# Patient Record
Sex: Female | Born: 1970 | Race: White | Hispanic: No | Marital: Married | Smoking: Never smoker
Health system: Southern US, Community
[De-identification: ages and names within clinical notes are randomized; demographics above are authoritative.]

## PROBLEM LIST (undated history)

## (undated) DIAGNOSIS — N62 Hypertrophy of breast: Secondary | ICD-10-CM

## (undated) DIAGNOSIS — F489 Nonpsychotic mental disorder, unspecified: Secondary | ICD-10-CM

## (undated) DIAGNOSIS — G479 Sleep disorder, unspecified: Secondary | ICD-10-CM

## (undated) DIAGNOSIS — Z87442 Personal history of urinary calculi: Secondary | ICD-10-CM

## (undated) DIAGNOSIS — F329 Major depressive disorder, single episode, unspecified: Secondary | ICD-10-CM

## (undated) DIAGNOSIS — IMO0002 Reserved for concepts with insufficient information to code with codable children: Secondary | ICD-10-CM

## (undated) DIAGNOSIS — F4310 Post-traumatic stress disorder, unspecified: Secondary | ICD-10-CM

## (undated) DIAGNOSIS — F32A Depression, unspecified: Secondary | ICD-10-CM

## (undated) DIAGNOSIS — M129 Arthropathy, unspecified: Secondary | ICD-10-CM

## (undated) DIAGNOSIS — F419 Anxiety disorder, unspecified: Secondary | ICD-10-CM

## (undated) DIAGNOSIS — R5382 Chronic fatigue, unspecified: Secondary | ICD-10-CM

## (undated) DIAGNOSIS — G9332 Myalgic encephalomyelitis/chronic fatigue syndrome: Secondary | ICD-10-CM

## (undated) DIAGNOSIS — M797 Fibromyalgia: Secondary | ICD-10-CM

## (undated) DIAGNOSIS — M539 Dorsopathy, unspecified: Secondary | ICD-10-CM

## (undated) DIAGNOSIS — G43909 Migraine, unspecified, not intractable, without status migrainosus: Secondary | ICD-10-CM

## (undated) DIAGNOSIS — F988 Other specified behavioral and emotional disorders with onset usually occurring in childhood and adolescence: Secondary | ICD-10-CM

## (undated) DIAGNOSIS — F410 Panic disorder [episodic paroxysmal anxiety] without agoraphobia: Secondary | ICD-10-CM

## (undated) HISTORY — PX: MANDIBLE SURGERY: SHX707

## (undated) HISTORY — DX: Chronic fatigue, unspecified: R53.82

## (undated) HISTORY — DX: Sleep disorder, unspecified: G47.9

## (undated) HISTORY — PX: CHOLECYSTECTOMY: SHX55

## (undated) HISTORY — DX: Nonpsychotic mental disorder, unspecified: F48.9

## (undated) HISTORY — DX: Myalgic encephalomyelitis/chronic fatigue syndrome: G93.32

## (undated) HISTORY — PX: DIAGNOSTIC LAPAROSCOPY: SUR761

## (undated) HISTORY — DX: Arthropathy, unspecified: M12.9

## (undated) HISTORY — PX: ABDOMINAL HYSTERECTOMY: SHX81

---

## 1999-05-24 ENCOUNTER — Other Ambulatory Visit: Admission: RE | Admit: 1999-05-24 | Discharge: 1999-05-24 | Payer: Self-pay | Admitting: Gynecology

## 2000-09-15 ENCOUNTER — Other Ambulatory Visit: Admission: RE | Admit: 2000-09-15 | Discharge: 2000-09-15 | Payer: Self-pay | Admitting: Gynecology

## 2001-03-26 ENCOUNTER — Emergency Department (HOSPITAL_COMMUNITY): Admission: EM | Admit: 2001-03-26 | Discharge: 2001-03-26 | Payer: Self-pay | Admitting: Emergency Medicine

## 2001-03-26 ENCOUNTER — Encounter: Payer: Self-pay | Admitting: Emergency Medicine

## 2001-09-17 ENCOUNTER — Other Ambulatory Visit: Admission: RE | Admit: 2001-09-17 | Discharge: 2001-09-17 | Payer: Self-pay | Admitting: Gynecology

## 2003-11-20 ENCOUNTER — Other Ambulatory Visit: Admission: RE | Admit: 2003-11-20 | Discharge: 2003-11-20 | Payer: Self-pay | Admitting: Gynecology

## 2004-03-26 ENCOUNTER — Other Ambulatory Visit: Admission: RE | Admit: 2004-03-26 | Discharge: 2004-03-26 | Payer: Self-pay | Admitting: Gynecology

## 2004-09-03 ENCOUNTER — Inpatient Hospital Stay (HOSPITAL_COMMUNITY): Admission: AD | Admit: 2004-09-03 | Discharge: 2004-09-08 | Payer: Self-pay | Admitting: Gynecology

## 2004-09-04 ENCOUNTER — Encounter (INDEPENDENT_AMBULATORY_CARE_PROVIDER_SITE_OTHER): Payer: Self-pay | Admitting: Specialist

## 2004-10-18 ENCOUNTER — Other Ambulatory Visit: Admission: RE | Admit: 2004-10-18 | Discharge: 2004-10-18 | Payer: Self-pay | Admitting: Gynecology

## 2006-06-13 ENCOUNTER — Other Ambulatory Visit: Admission: RE | Admit: 2006-06-13 | Discharge: 2006-06-13 | Payer: Self-pay | Admitting: Gynecology

## 2007-11-14 ENCOUNTER — Other Ambulatory Visit: Admission: RE | Admit: 2007-11-14 | Discharge: 2007-11-14 | Payer: Self-pay | Admitting: Gynecology

## 2008-10-24 ENCOUNTER — Ambulatory Visit: Payer: Self-pay | Admitting: Women's Health

## 2008-11-05 ENCOUNTER — Ambulatory Visit: Payer: Self-pay | Admitting: Women's Health

## 2008-11-10 ENCOUNTER — Ambulatory Visit: Payer: Self-pay | Admitting: Women's Health

## 2008-12-23 ENCOUNTER — Inpatient Hospital Stay (HOSPITAL_COMMUNITY): Admission: AD | Admit: 2008-12-23 | Discharge: 2008-12-23 | Payer: Self-pay | Admitting: Obstetrics

## 2009-06-12 ENCOUNTER — Inpatient Hospital Stay (HOSPITAL_COMMUNITY): Admission: RE | Admit: 2009-06-12 | Discharge: 2009-06-14 | Payer: Self-pay | Admitting: Obstetrics and Gynecology

## 2009-06-12 ENCOUNTER — Encounter (INDEPENDENT_AMBULATORY_CARE_PROVIDER_SITE_OTHER): Payer: Self-pay | Admitting: Obstetrics and Gynecology

## 2009-06-16 ENCOUNTER — Observation Stay (HOSPITAL_COMMUNITY): Admission: AD | Admit: 2009-06-16 | Discharge: 2009-06-17 | Payer: Self-pay | Admitting: Obstetrics & Gynecology

## 2009-10-08 ENCOUNTER — Encounter: Admission: RE | Admit: 2009-10-08 | Discharge: 2009-10-08 | Payer: Self-pay | Admitting: Gastroenterology

## 2010-08-02 ENCOUNTER — Encounter: Admission: RE | Admit: 2010-08-02 | Discharge: 2010-08-02 | Payer: Self-pay | Admitting: Obstetrics and Gynecology

## 2010-08-09 ENCOUNTER — Encounter: Admission: RE | Admit: 2010-08-09 | Discharge: 2010-08-09 | Payer: Self-pay | Admitting: Obstetrics and Gynecology

## 2010-08-30 ENCOUNTER — Ambulatory Visit (HOSPITAL_COMMUNITY): Admission: RE | Admit: 2010-08-30 | Discharge: 2010-08-30 | Payer: Self-pay | Admitting: Obstetrics and Gynecology

## 2010-12-28 LAB — CBC
HCT: 42 % (ref 36.0–46.0)
Hemoglobin: 14.3 g/dL (ref 12.0–15.0)
MCH: 29.4 pg (ref 26.0–34.0)
MCHC: 34 g/dL (ref 30.0–36.0)
RDW: 13.2 % (ref 11.5–15.5)

## 2011-01-10 ENCOUNTER — Other Ambulatory Visit: Payer: Self-pay | Admitting: Obstetrics and Gynecology

## 2011-01-10 DIAGNOSIS — Z09 Encounter for follow-up examination after completed treatment for conditions other than malignant neoplasm: Secondary | ICD-10-CM

## 2011-01-21 LAB — COMPREHENSIVE METABOLIC PANEL
AST: 34 U/L (ref 0–37)
Albumin: 2.2 g/dL — ABNORMAL LOW (ref 3.5–5.2)
BUN: 17 mg/dL (ref 6–23)
Calcium: 8.6 mg/dL (ref 8.4–10.5)
Creatinine, Ser: 0.78 mg/dL (ref 0.4–1.2)
GFR calc Af Amer: >60 mL/min (ref >60)
Total Bilirubin: 0.4 mg/dL (ref 0.3–1.2)
Total Protein: 4.8 g/dL — ABNORMAL LOW (ref 6.0–8.3)

## 2011-01-21 LAB — DIFFERENTIAL
Basophils Absolute: 0 10*3/uL (ref 0.0–0.1)
Lymphocytes Relative: 21 % (ref 12–46)
Lymphs Abs: 1.5 10*3/uL (ref 0.7–4.0)
Monocytes Absolute: 0.4 10*3/uL (ref 0.1–1.0)
Monocytes Relative: 6 % (ref 3–12)
Neutro Abs: 4.7 10*3/uL (ref 1.7–7.7)

## 2011-01-21 LAB — CBC
HCT: 30.3 % — ABNORMAL LOW (ref 36.0–46.0)
MCV: 91.8 fL (ref 78.0–100.0)
Platelets: 173 10*3/uL (ref 150–400)
RDW: 14.2 % (ref 11.5–15.5)

## 2011-01-22 LAB — COMPREHENSIVE METABOLIC PANEL
ALT: 17 U/L (ref 0–35)
ALT: 29 U/L (ref 0–35)
AST: 32 U/L (ref 0–37)
AST: 34 U/L (ref 0–37)
CO2: 20 mEq/L (ref 19–32)
CO2: 29 mEq/L (ref 19–32)
Chloride: 104 mEq/L (ref 96–112)
Chloride: 106 mEq/L (ref 96–112)
Creatinine, Ser: 0.7 mg/dL (ref 0.4–1.2)
GFR calc Af Amer: >60 mL/min (ref >60)
GFR calc Af Amer: >60 mL/min (ref >60)
GFR calc non Af Amer: >60 mL/min (ref >60)
GFR calc non Af Amer: >60 mL/min (ref >60)
Glucose, Bld: 80 mg/dL (ref 70–99)
Sodium: 138 mEq/L (ref 135–145)
Total Bilirubin: 0.5 mg/dL (ref 0.3–1.2)
Total Bilirubin: 0.8 mg/dL (ref 0.3–1.2)

## 2011-01-22 LAB — CBC
Hemoglobin: 12.7 g/dL (ref 12.0–15.0)
MCHC: 34.3 g/dL (ref 30.0–36.0)
MCV: 91.2 fL (ref 78.0–100.0)
RBC: 3.38 MIL/uL — ABNORMAL LOW (ref 3.87–5.11)
RBC: 3.41 MIL/uL — ABNORMAL LOW (ref 3.87–5.11)
RBC: 4.14 MIL/uL (ref 3.87–5.11)
RDW: 13.7 % (ref 11.5–15.5)
WBC: 7.2 10*3/uL (ref 4.0–10.5)
WBC: 7.9 10*3/uL (ref 4.0–10.5)

## 2011-01-27 LAB — DIFFERENTIAL
Eosinophils Absolute: 0 10*3/uL (ref 0.0–0.7)
Lymphs Abs: 0.5 10*3/uL — ABNORMAL LOW (ref 0.7–4.0)
Monocytes Relative: 3 % (ref 3–12)
Neutrophils Relative %: 91 % — ABNORMAL HIGH (ref 43–77)

## 2011-01-27 LAB — COMPREHENSIVE METABOLIC PANEL
ALT: 28 U/L (ref 0–35)
Calcium: 9.7 mg/dL (ref 8.4–10.5)
GFR calc Af Amer: >60 mL/min (ref >60)
Glucose, Bld: 109 mg/dL — ABNORMAL HIGH (ref 70–99)
Sodium: 137 mEq/L (ref 135–145)
Total Protein: 6.3 g/dL (ref 6.0–8.3)

## 2011-01-27 LAB — CBC
MCHC: 34.6 g/dL (ref 30.0–36.0)
RDW: 13.5 % (ref 11.5–15.5)

## 2011-01-31 ENCOUNTER — Ambulatory Visit
Admission: RE | Admit: 2011-01-31 | Discharge: 2011-01-31 | Disposition: A | Discharge status: Home / Self Care | Payer: 59 | Source: Ambulatory Visit | Attending: Obstetrics and Gynecology | Admitting: Obstetrics and Gynecology

## 2011-01-31 DIAGNOSIS — Z09 Encounter for follow-up examination after completed treatment for conditions other than malignant neoplasm: Secondary | ICD-10-CM

## 2011-03-01 NOTE — Op Note (Signed)
[name], [name]                ACCOUNT NO.:  [id number]   MEDICAL RECORD NO.:  [id number]          PATIENT TYPE:  INP   LOCATION:  9104                          FACILITY:  [facility]   PHYSICIAN:  Lenoard Aden, M.D.DATE OF BIRTH:  [birth year]   DATE OF PROCEDURE:  06/12/2009  DATE OF DISCHARGE:                               OPERATIVE REPORT   PREOPERATIVE DIAGNOSES:  1. Thirty-seven weeks' intrauterine pregnancy.  2. Previous cesarean section.  3. Desire for elective sterilization.  4. Cholestasis of Pregnancy   POSTOPERATIVE DIAGNOSES:  1.Thirty-seven weeks' intrauterine pregnancy.  1. Previous cesarean section.  2. Desire for elective sterilization.  3. _Cholestasis_________.  4. Lower uterine segment _dehiscence_________.   PROCEDURES:  Repeat low-transverse cesarean section and tubal ligation.   SURGEON:  Lenoard Aden, MD   ASSISTANT:  Marlinda Mike, CNM   ANESTHESIA:  Spinal by Rodman Pickle.   ESTIMATED BLOOD LOSS:  1000 mL.   COMPLICATIONS:  None.   DRAINS:  Foley.   COUNTS:  Correct.   The patient to recovery in good condition.   SPECIMENS:  Placenta __________ to pathology.   BRIEF OPERATIVE NOTE:  After being apprised of risks and benefits of  surgery including anesthesia, infection, bleeding, injury to abdominal  organs and need for repair, delayed versus immediate complications to  include bowel and bladder injury, __________ where she was also apprised  of the failure risk of tubal ligation of 5 to 10 per 1000.  At this  time, she was administered spinal anesthetic without complications,  prepped and draped in usual sterile fashion.  Foley catheter was placed.  After achieving adequate anesthesia, dilute Marcaine solution was placed  and skin incision was made with scalpel and carried down to fascia,  which was nicked in the midline and extended transversely using Mayo  scissors.  Rectus muscles were dissected sharply in the midline.  Peritoneum was  entered sharply.  Bladder blade was placed.  Visceral  peritoneum was scored sharply off of a very thin lower uterine segment,  which is clear and translucent, no evidence of dehiscence, but lower  uterine segment was identified.  A Kerr hysterotomy incision was made.  Atraumatic delivery of a full term living female __________ handed to  pediatricians.  Apgars of 8 and 9.  Cord blood was collected.  Placenta  delivered from a posterior location, intact, 3-vessel cord.  Uterus was  exteriorized, curetted using a dry lap pack and closed in a running  locking __________.  Right tube traced out to the fimbriated end and  ampullary-isthmic portion was identified.  Pomeroy tubal ligation  accomplished with __________, the tubal lumen was visualized and  cauterized.  Good hemostasis was noted.  The same procedure was done on  the right tube and left tube.  __________ the abdominal cavity.  Irrigation was accomplished __________ hemostatic.  The fascia was then  closed using __________ fashion.  The patient tolerated the procedure  well and transferred to recovery in good condition.      Lenoard Aden, M.D.  Electronically Signed     RJT/MEDQ  D:  06/12/2009  T:  06/12/2009  Job:  [id number]

## 2011-03-04 NOTE — Op Note (Signed)
[name], [name]                ACCOUNT NO.:  [id number]   MEDICAL RECORD NO.:  [id number]          PATIENT TYPE:  INP   LOCATION:  9372                          [facility]ILITY[facility]  [facility]   PHYSICIAN:  Charles A. Delcambre, MDDATE OF BIRTH:  Mar 04, 1971   DATE OF PROCEDURE:  09/04/2004  DATE OF DISCHARGE:                                 OPERATIVE REPORT   PREOPERATIVE DIAGNOSES:  1.  Severe pregnancy induced hypertension/preeclampsia versus HELLP      syndrome.  2.  Intrauterine pregnancy at 34-4/7 weeks.   POSTOPERATIVE DIAGNOSES:  1.  Severe pregnancy induced hypertension/preeclampsia versus HELLP      syndrome.  2.  Intrauterine pregnancy at 34-4/7 weeks.   PROCEDURE:  Primary low transverse cesarean section.   SURGEON:  Charles A. Sydnee Cabal, M.D.   ASSISTANT:  None.   COMPLICATIONS:  None.   ESTIMATED BLOOD LOSS:  900 mL.   COUNTS:  Instrument, sponge and needle count correct x2.   SPECIMENS:  Placenta to pathology.   FINDINGS:  Vigorous female, Apgars 6 and 8.  Thin meconium.  Nuchal cord x1.  Cord arterial blood gas 7.23.  Cord for venous blood gas 7.29.   URINE OUTPUT:  300 mL.   INTRAVENOUS FLUIDS:  2000 mL.   DESCRIPTION OF PROCEDURE:  The patient was taken to the operating room and  placed in the supine position.  Spinal anesthetic had been placed and was  adequate.  Sterile prep and drape was then taken.  A Pfannenstiel incision  was made with the knife and carried down to the fascia.  The fascia was  incised with a knife and Mayo scissors.  The rectus sheath was released  superiorly and inferiorly without difficulty.  Blunt dissection as well as  sharp dissection was used to separate the rectus muscles in the midline.  The peritoneum was entered with Metzenbaum scissors.  Traction was used to  extend the incision.  The bladder blade was placed.  The vesicouterine  peritoneum was incised with Metzenbaum scissors, and blunt dissection was  used to close the  bladder flap.  The lower uterine segment transverse  incision was made with the knife to the amniotomy.  Traction was used to  extend the incision.  The hand was inserted and occiput lifted to the  incision site.  The fundal pressure was applied by the operator's assistant  and influenced by the liver without difficulty.  When the hip was delivered,  DeLee suction was carried out.  Nuchal cord was reduced.  The remainder of  the infant was delivered without difficulty.  The cord was clamped and  handed off to the neonatologist Dr. Judie Grieve.  Nuchal cord was reduced.  The  remainder of the infant was delivered without difficulty.  The cord was  clamped and handed off to the neonatologist, Dr. Judie Grieve, who is in  attendance.  After being shown to the parents, cord gases were taken.  Cord  blood was collected.  The uterus was externalized after manual extraction of  the placenta.  The internal surface of the uterus was wiped with moistened  lap.  A #1 chromic was then used to close the uterus in two layers, the  first layer a running locking, the second layer a running nonlocking  imbricating over the first.  A single figure-of-eight suture of 0 Vicryl and  then a second figure-of-eight overlying this first of 2-0 Vicryl were placed  for good hemostasis.  Hemostasis was excellent.  The uterus had been  reinternalized.  Irrigation was carried out.  The pericolic gutter and  uterine incision were visualized.  Hemostasis was adequate.  Bladder flap  was said to have good hemostasis.  Subfascial hemostasis was excellent.  The  fascia was then closed with #1 Vicryl running nonlocking sutures.  Subcutaneous irrigation was excellent.  Irrigation was carried out.  Sterile  skin clips were used to close the incision.  A sterile dressing was applied  and Steri-Strips and the patient was recovered and taken to recovery.      CAD/MEDQ  D:  09/04/2004  T:  09/05/2004  Job:  [id number]

## 2011-03-04 NOTE — Discharge Summary (Signed)
NAMEMARGRETTA, [name]                ACCOUNT NO.:  [id number]   MEDICAL RECORD NO.:  [id number]          PATIENT TYPE:  INP   LOCATION:  9121                          [facility]ILITY:  [facility]   PHYSICIAN:  Timothy P. Fontaine, M.D.DATE OF BIRTH:  04/11/1971   DATE OF ADMISSION:  09/03/2004  DATE OF DISCHARGE:  09/08/2004                                 DISCHARGE SUMMARY   DISCHARGE DIAGNOSES:  1.  Pregnancy at 34 weeks.  2.  Hemolysis, elevated liver function, and low platelet syndrome.   PROCEDURE:  Primary low transverse cervical cesarean section on September 04, 2004 by Dr. Sydnee Cabal.   HOSPITAL COURSE:  A 41 year old G1 P0 female at 87 weeks who presented  complaining of itching.  She was found to have elevated liver function  studies to include increased SGOT and SGPT with an elevated blood pressure  in the 120 to 122 over 90 to 94 range.  She had 2+ proteinuria and was  admitted with the diagnosis of HELLP syndrome.  The patient received  magnesium sulfate prophylaxis, was begun on steroid administration to  accelerate fetal lung maturity.  The patient subsequently underwent a  primary cesarean section for HELLP syndrome after the administration of the  steroids, and subsequently produced a normal female, Apgars 6 and 8, nuchal  cord noted.  Arterial cord pH was 7.23.  The patient's postoperative course  was unremarkable, blood pressures running in the 120s over 70s range, and  her liver function tests decreasing.  Her platelet count had always been in  the normal range, noting a postoperative hemoglobin of 10 and a platelet  count of 229,000.  The patient was discharged on postoperative day #4  ambulating well, tolerating a regular diet.  She received precautions,  instructions, and follow-up, will be seen in 6 weeks.  Received a  prescription for Tylox #20 one to two p.o. q.4-6h. p.r.n. pain.  The patient  did receive an influenza viral vaccine before her discharge.     Timo   TPF/MEDQ  D:  09/30/2004  T:  09/30/2004  Job:  [id number]

## 2011-03-04 NOTE — H&P (Signed)
[name], [name]                ACCOUNT NO.:  [id number]   MEDICAL RECORD NO.:  [id number]          PATIENT TYPE:  INP   LOCATION:  9169                          [facility]ILITY[facility]  [facility]   PHYSICIAN:  Timothy P. Fontaine, M.D.DATE OF BIRTH:  08/16/1971   DATE OF ADMISSION:  09/03/2004  DATE OF DISCHARGE:                                HISTORY & PHYSICAL   CHIEF COMPLAINT:  Pregnancy at 34 weeks, itching, swelling.   HISTORY OF PRESENT ILLNESS:  A 40 year old G1 P0 female at [redacted] weeks  gestation who presented complaining of itching.  The patient otherwise was  doing well, had a liver panel drawn which showed elevated alkaline  phosphatase, SGOT, SGPT, as well as elevated bile acids in the 153 range.  The patient returned for evaluation, was found to have an elevated blood  pressure in the 120 to 122 over 90 to 94 range, 2+ proteinuria,  hyperreflexia.  She has a normal NST, normal AFI, and a biophysical profile  of 8/8.  The patient denies symptoms such as headache, blurred vision, or  epigastric pain.  She does note swelling which seems to have significantly  worsened over the last several days.  Prenatal course has otherwise been  uncomplicated.  She does have a history of depression for which she  currently is on Zoloft.  For the remainder of her history and physical, see  her [facility].   PHYSICAL EXAMINATION:  VITAL SIGNS:  Blood pressure 122/94.  HEENT:  Normal.  LUNGS:  Clear.  CARDIAC:  Regular rate and rhythm without rubs, murmurs, or gallops.  ABDOMEN:  Gravid, vertex fetus appropriate for dates.  Reactive NST without  significant contractions.  PELVIC:  Shows 50%, closed, -2, vertex presentation.  EXTREMITIES:  With 1+ pitting edema, 3-4+ DTRs, no clonus.   ASSESSMENT AND PLAN:  A 40 year old gravida 1 para 0 at 34 weeks,  significant elevation in her liver functions, hypertension, proteinuria.  Differential most likely HELLP syndrome.  CBC is pending at the time of this  dictation, which would help to clarify the situation.  Differential does  include cholestasis of pregnancy; viral hepatitis, although the patient has  no history of exposure, drug or medication, although again the patient has  no history of significant drug exposure; acute fatty liver of pregnancy; and  other primary hepatic diseases.  Given 34 weeks and the most likely being  HELLP, discussed situation with the patient and her husband and I think the  most prudent is to proceed with delivery.  Will go ahead and recheck liver  function studies now, along with CBC, hepatitis viral panel both acute and  chronic, comprehensive metabolic panel for renal function, glucose,  electrolytes.  Begin on magnesium sulfate prophylaxis 4 g bolus, 2 g per  hour, steroid administration for fetal lung acceleration, and then move  towards induction.  If clinically significantly worsens or questionable  fetal well-being, or platelet count significantly low, then may proceed more  expeditiously with cesarean delivery.  The plan was discussed with the  patient and her husband, who understand and agree.  Then will  after delivery  follow liver functions if question of primary disease and will involve  gastroenterologist for further evaluation and therapy.     Timo   TPF/MEDQ  D:  09/03/2004  T:  09/03/2004  Job:  [id number]

## 2011-03-30 ENCOUNTER — Ambulatory Visit (HOSPITAL_COMMUNITY)
Admission: RE | Admit: 2011-03-30 | Discharge: 2011-03-30 | Disposition: A | Discharge status: Home / Self Care | Payer: 59 | Attending: Psychiatry | Admitting: Psychiatry

## 2011-03-30 DIAGNOSIS — F329 Major depressive disorder, single episode, unspecified: Secondary | ICD-10-CM | POA: Insufficient documentation

## 2011-04-01 ENCOUNTER — Other Ambulatory Visit (HOSPITAL_COMMUNITY): Payer: 59 | Admitting: Psychiatry

## 2011-04-04 ENCOUNTER — Other Ambulatory Visit (HOSPITAL_COMMUNITY): Payer: 59 | Attending: Psychiatry | Admitting: Psychiatry

## 2011-04-04 DIAGNOSIS — F3289 Other specified depressive episodes: Secondary | ICD-10-CM

## 2011-04-04 DIAGNOSIS — F329 Major depressive disorder, single episode, unspecified: Secondary | ICD-10-CM | POA: Insufficient documentation

## 2011-04-05 ENCOUNTER — Other Ambulatory Visit (HOSPITAL_COMMUNITY): Payer: 59 | Admitting: Psychiatry

## 2011-04-06 ENCOUNTER — Other Ambulatory Visit (HOSPITAL_COMMUNITY): Payer: 59 | Attending: Psychiatry | Admitting: Psychiatry

## 2011-04-06 DIAGNOSIS — F329 Major depressive disorder, single episode, unspecified: Secondary | ICD-10-CM | POA: Insufficient documentation

## 2011-04-07 ENCOUNTER — Other Ambulatory Visit (HOSPITAL_COMMUNITY): Payer: 59 | Admitting: Psychiatry

## 2011-04-08 ENCOUNTER — Other Ambulatory Visit (HOSPITAL_COMMUNITY): Payer: 59 | Admitting: Psychiatry

## 2011-04-11 ENCOUNTER — Other Ambulatory Visit (HOSPITAL_COMMUNITY): Payer: 59 | Admitting: Psychiatry

## 2011-04-12 ENCOUNTER — Other Ambulatory Visit (HOSPITAL_COMMUNITY): Payer: 59 | Admitting: Psychiatry

## 2011-04-14 ENCOUNTER — Other Ambulatory Visit (HOSPITAL_COMMUNITY): Payer: 59 | Admitting: Psychiatry

## 2011-04-15 ENCOUNTER — Other Ambulatory Visit (HOSPITAL_COMMUNITY): Payer: 59 | Admitting: Psychiatry

## 2011-04-18 ENCOUNTER — Other Ambulatory Visit (HOSPITAL_COMMUNITY): Payer: 59 | Admitting: Psychiatry

## 2011-04-19 ENCOUNTER — Other Ambulatory Visit (HOSPITAL_COMMUNITY): Payer: 59 | Admitting: Psychiatry

## 2011-04-21 ENCOUNTER — Other Ambulatory Visit (HOSPITAL_COMMUNITY): Payer: 59 | Admitting: Psychiatry

## 2011-04-22 ENCOUNTER — Other Ambulatory Visit (HOSPITAL_COMMUNITY): Payer: 59 | Admitting: Psychiatry

## 2011-04-25 ENCOUNTER — Other Ambulatory Visit (HOSPITAL_COMMUNITY): Payer: 59 | Admitting: Psychiatry

## 2011-04-26 ENCOUNTER — Other Ambulatory Visit (HOSPITAL_COMMUNITY): Payer: 59 | Admitting: Psychiatry

## 2011-05-17 ENCOUNTER — Other Ambulatory Visit: Payer: Self-pay | Admitting: Obstetrics and Gynecology

## 2011-05-17 DIAGNOSIS — N63 Unspecified lump in unspecified breast: Secondary | ICD-10-CM

## 2011-07-18 ENCOUNTER — Ambulatory Visit
Admission: RE | Admit: 2011-07-18 | Discharge: 2011-07-18 | Disposition: A | Discharge status: Home / Self Care | Payer: 59 | Source: Ambulatory Visit | Attending: Obstetrics and Gynecology | Admitting: Obstetrics and Gynecology

## 2011-07-18 DIAGNOSIS — N63 Unspecified lump in unspecified breast: Secondary | ICD-10-CM

## 2012-06-13 ENCOUNTER — Other Ambulatory Visit: Payer: Self-pay | Admitting: Obstetrics and Gynecology

## 2012-06-13 DIAGNOSIS — Z1231 Encounter for screening mammogram for malignant neoplasm of breast: Secondary | ICD-10-CM

## 2012-07-23 ENCOUNTER — Ambulatory Visit
Admission: RE | Admit: 2012-07-23 | Discharge: 2012-07-23 | Disposition: A | Discharge status: Home / Self Care | Payer: 59 | Source: Ambulatory Visit | Attending: Obstetrics and Gynecology | Admitting: Obstetrics and Gynecology

## 2012-07-23 DIAGNOSIS — Z1231 Encounter for screening mammogram for malignant neoplasm of breast: Secondary | ICD-10-CM

## 2013-09-20 ENCOUNTER — Other Ambulatory Visit: Payer: Self-pay | Admitting: Obstetrics and Gynecology

## 2013-10-03 ENCOUNTER — Encounter (HOSPITAL_COMMUNITY): Payer: Self-pay

## 2013-10-15 ENCOUNTER — Encounter (HOSPITAL_COMMUNITY)
Admission: RE | Admit: 2013-10-15 | Discharge: 2013-10-15 | Disposition: A | Discharge status: Home / Self Care | Payer: 59 | Source: Ambulatory Visit | Attending: Obstetrics and Gynecology | Admitting: Obstetrics and Gynecology

## 2013-10-15 ENCOUNTER — Encounter (HOSPITAL_COMMUNITY): Payer: Self-pay

## 2013-10-15 HISTORY — DX: Major depressive disorder, single episode, unspecified: F32.9

## 2013-10-15 HISTORY — DX: Personal history of urinary calculi: Z87.442

## 2013-10-15 HISTORY — DX: Anxiety disorder, unspecified: F41.9

## 2013-10-15 HISTORY — DX: Depression, unspecified: F32.A

## 2013-10-15 HISTORY — DX: Reserved for concepts with insufficient information to code with codable children: IMO0002

## 2013-10-15 HISTORY — DX: Other specified behavioral and emotional disorders with onset usually occurring in childhood and adolescence: F98.8

## 2013-10-15 LAB — CBC
HCT: 37.4 % (ref 36.0–46.0)
Hemoglobin: 12.8 g/dL (ref 12.0–15.0)
MCHC: 34.2 g/dL (ref 30.0–36.0)
RBC: 4.24 MIL/uL (ref 3.87–5.11)
RDW: 12.7 % (ref 11.5–15.5)
WBC: 6.4 10*3/uL (ref 4.0–10.5)

## 2013-10-15 NOTE — H&P (Signed)
[name], [name]                ACCOUNT NO.:  [id number]  MEDICAL RECORD NO.:  [id number]  LOCATION:  PERIO                         [facility]ACILITY:  [facility]  PHYSICIAN:  Lenoard Aden, M.D.DATE OF BIRTH:  02-Feb-1971  DATE OF ADMISSION:  09/06/2013 DATE OF DISCHARGE:                             HISTORY & PHYSICAL   CHIEF COMPLAINT:  Refractory menorrhagia.  HISTORY OF PRESENT ILLNESS:  She is a 42 year old white female, G2, P2, history of C-section x2, who presents for definitive management and refractory menorrhagia and failed endometrial ablation.  PAST MEDICAL HISTORY:  ALLERGIES:  SULFA.  FAMILY HISTORY:  Colon cancer, breast cancer, hypertension, depression, insulin-dependent diabetes.  MEDICATIONS: 1. Flexeril. 2. Xanax. 3. Adderall. 4. Prozac.  SURGICAL HISTORY:  Skin graft to the mouth, NovaSure ablation in 2011, C- section x2 with tubal ligation.  PHYSICAL EXAMINATION:  GENERAL:  She is a well-developed, well- nourished, white female, in no acute distress. HEENT:  Normal. NECK:  Supple.  Full range of motion. LUNGS:  Clear. HEART:  Regular rate and rhythm. ABDOMEN:  Soft, nontender. PELVIC:  Reveals the uterus to be bulky and anteflexed with no adnexal masses.  Anterior fibroid is about 3 x 3 cm noted. EXTREMITIES:  There are no cords. NEUROLOGIC:  Nonfocal. SKIN:  Intact.  IMPRESSION: 1. Symptomatic fibroids. 2. Refractory menorrhagia.  PLAN:  Proceed with da Vinci assisted total laparoscopic hysterectomy, bilateral salpingectomy, risks of anesthesia, infection, bleeding, injury to surrounding organs, possible need for repair was discussed, delayed versus immediate complications to include bowel and bladder injury noted.  The patient acknowledges and wishes to proceed.     Lenoard Aden, M.D.     RJT/MEDQ  D:  10/15/2013  T:  10/15/2013  Job:  [id number]

## 2013-10-15 NOTE — Patient Instructions (Signed)
20 [name]  10/15/2013   Your procedure is scheduled on:  10/16/13  Enter through the Main Entrance of [facility] at 6 AM.  Pick up the phone at the desk and dial [phone].   Call this number if you have problems the morning of surgery: [phone]   Remember:   Do not eat food:After Midnight.  Do not drink clear liquids: After Midnight.  Take these medicines the morning of surgery with A SIP OF WATER: may take Xanax if needed.   Do not wear jewelry, make-up or nail polish.  Do not wear lotions, powders, or perfumes. You may wear deodorant.  Do not shave 48 hours prior to surgery.  Do not bring valuables to the hospital.  [facility] is not   responsible for any belongings or valuables brought to the hospital.  Contacts, dentures or bridgework may not be worn into surgery.  Leave suitcase in the car. After surgery it may be brought to your room.  For patients admitted to the hospital, checkout time is 11:00 AM the day of              discharge.   Patients discharged the day of surgery will not be allowed to drive             home.  Name and phone number of your driver: NA  Special Instructions:   Shower using CHG 2 nights before surgery and the night before surgery.  If you shower the day of surgery use CHG.  Use special wash - you have one bottle of CHG for all showers.  You should use approximately 1/3 of the bottle for each shower.   Please read over the following fact sheets that you were given:   Surgical Site Infection Prevention

## 2013-10-16 ENCOUNTER — Encounter (HOSPITAL_COMMUNITY): Payer: Self-pay | Admitting: Anesthesiology

## 2013-10-16 ENCOUNTER — Ambulatory Visit (HOSPITAL_COMMUNITY): Payer: 59 | Admitting: Anesthesiology

## 2013-10-16 ENCOUNTER — Encounter (HOSPITAL_COMMUNITY): Payer: 59 | Admitting: Anesthesiology

## 2013-10-16 ENCOUNTER — Ambulatory Visit (HOSPITAL_COMMUNITY)
Admission: RE | Admit: 2013-10-16 | Discharge: 2013-10-17 | Disposition: A | Discharge status: Home / Self Care | Payer: 59 | Source: Ambulatory Visit | Attending: Obstetrics and Gynecology | Admitting: Obstetrics and Gynecology

## 2013-10-16 ENCOUNTER — Encounter (HOSPITAL_COMMUNITY)
Admission: RE | Disposition: A | Discharge status: Home / Self Care | Payer: Self-pay | Source: Ambulatory Visit | Attending: Obstetrics and Gynecology

## 2013-10-16 DIAGNOSIS — N8000 Endometriosis of the uterus, unspecified: Secondary | ICD-10-CM | POA: Insufficient documentation

## 2013-10-16 DIAGNOSIS — D251 Intramural leiomyoma of uterus: Secondary | ICD-10-CM | POA: Insufficient documentation

## 2013-10-16 DIAGNOSIS — N949 Unspecified condition associated with female genital organs and menstrual cycle: Secondary | ICD-10-CM | POA: Insufficient documentation

## 2013-10-16 DIAGNOSIS — D252 Subserosal leiomyoma of uterus: Secondary | ICD-10-CM | POA: Insufficient documentation

## 2013-10-16 DIAGNOSIS — N920 Excessive and frequent menstruation with regular cycle: Secondary | ICD-10-CM | POA: Insufficient documentation

## 2013-10-16 DIAGNOSIS — N815 Vaginal enterocele: Secondary | ICD-10-CM | POA: Insufficient documentation

## 2013-10-16 DIAGNOSIS — D219 Benign neoplasm of connective and other soft tissue, unspecified: Secondary | ICD-10-CM | POA: Diagnosis present

## 2013-10-16 HISTORY — PX: BILATERAL SALPINGECTOMY: SHX5743

## 2013-10-16 HISTORY — PX: ROBOTIC ASSISTED TOTAL HYSTERECTOMY: SHX6085

## 2013-10-16 LAB — HCG, SERUM, QUALITATIVE: Preg, Serum: NEGATIVE

## 2013-10-16 SURGERY — ROBOTIC ASSISTED TOTAL HYSTERECTOMY
Anesthesia: General | Site: Abdomen

## 2013-10-16 MED ORDER — PROPOFOL 10 MG/ML IV BOLUS
INTRAVENOUS | Status: DC | PRN
  Administered 2013-10-16: 150 mg via INTRAVENOUS

## 2013-10-16 MED ORDER — ZOLPIDEM TARTRATE 5 MG PO TABS
5.0000 mg | ORAL_TABLET | Freq: Every evening | ORAL | Status: DC | PRN
  Administered 2013-10-16: 5 mg via ORAL
  Filled 2013-10-16: qty 1

## 2013-10-16 MED ORDER — FENTANYL CITRATE 0.05 MG/ML IJ SOLN
INTRAMUSCULAR | Status: AC
  Filled 2013-10-16: qty 5

## 2013-10-16 MED ORDER — KETOROLAC TROMETHAMINE 30 MG/ML IJ SOLN
INTRAMUSCULAR | Status: AC
  Filled 2013-10-16: qty 1

## 2013-10-16 MED ORDER — LIDOCAINE HCL (CARDIAC) 20 MG/ML IV SOLN
INTRAVENOUS | Status: AC
  Filled 2013-10-16: qty 5

## 2013-10-16 MED ORDER — ROPIVACAINE HCL 5 MG/ML IJ SOLN
INTRAMUSCULAR | Status: AC
  Filled 2013-10-16: qty 30

## 2013-10-16 MED ORDER — EPHEDRINE SULFATE 50 MG/ML IJ SOLN
INTRAMUSCULAR | Status: DC | PRN
  Administered 2013-10-16: 5 mg via INTRAVENOUS
  Administered 2013-10-16: 10 mg via INTRAVENOUS
  Administered 2013-10-16: 5 mg via INTRAVENOUS

## 2013-10-16 MED ORDER — ONDANSETRON HCL 4 MG/2ML IJ SOLN: 4.0000 mg | Freq: Four times a day (QID) | INTRAMUSCULAR | Status: DC | PRN

## 2013-10-16 MED ORDER — KETOROLAC TROMETHAMINE 30 MG/ML IJ SOLN
INTRAMUSCULAR | Status: DC | PRN
  Administered 2013-10-16: 30 mg via INTRAVENOUS

## 2013-10-16 MED ORDER — LIDOCAINE HCL (CARDIAC) 20 MG/ML IV SOLN
INTRAVENOUS | Status: DC | PRN
  Administered 2013-10-16: 80 mg via INTRAVENOUS

## 2013-10-16 MED ORDER — ROCURONIUM BROMIDE 100 MG/10ML IV SOLN
INTRAVENOUS | Status: DC | PRN
  Administered 2013-10-16: 50 mg via INTRAVENOUS
  Administered 2013-10-16: 30 mg via INTRAVENOUS

## 2013-10-16 MED ORDER — SODIUM CHLORIDE 0.9 % IJ SOLN
INTRAMUSCULAR | Status: AC
  Filled 2013-10-16: qty 20

## 2013-10-16 MED ORDER — MIDAZOLAM HCL 2 MG/2ML IJ SOLN
INTRAMUSCULAR | Status: AC
  Filled 2013-10-16: qty 2

## 2013-10-16 MED ORDER — DEXTROSE IN LACTATED RINGERS 5 % IV SOLN: INTRAVENOUS | Status: DC

## 2013-10-16 MED ORDER — SCOPOLAMINE 1 MG/3DAYS TD PT72
1.0000 | MEDICATED_PATCH | TRANSDERMAL | Status: DC
  Administered 2013-10-16: 1.5 mg via TRANSDERMAL

## 2013-10-16 MED ORDER — BUPIVACAINE HCL (PF) 0.25 % IJ SOLN
INTRAMUSCULAR | Status: DC | PRN
  Administered 2013-10-16: 7 mL

## 2013-10-16 MED ORDER — ROCURONIUM BROMIDE 100 MG/10ML IV SOLN
INTRAVENOUS | Status: AC
  Filled 2013-10-16: qty 1

## 2013-10-16 MED ORDER — MEPERIDINE HCL 25 MG/ML IJ SOLN: 6.2500 mg | INTRAMUSCULAR | Status: DC | PRN

## 2013-10-16 MED ORDER — FENTANYL CITRATE 0.05 MG/ML IJ SOLN
INTRAMUSCULAR | Status: DC | PRN
  Administered 2013-10-16 (×5): 50 ug via INTRAVENOUS

## 2013-10-16 MED ORDER — METHYLENE BLUE 1 % INJ SOLN
INTRAMUSCULAR | Status: AC
  Filled 2013-10-16: qty 10

## 2013-10-16 MED ORDER — BUPIVACAINE HCL (PF) 0.25 % IJ SOLN
INTRAMUSCULAR | Status: AC
  Filled 2013-10-16: qty 30

## 2013-10-16 MED ORDER — DIPHENHYDRAMINE HCL 12.5 MG/5ML PO ELIX: 12.5000 mg | ORAL_SOLUTION | Freq: Four times a day (QID) | ORAL | Status: DC | PRN

## 2013-10-16 MED ORDER — LACTATED RINGERS IV SOLN
INTRAVENOUS | Status: DC
  Administered 2013-10-16 (×2): via INTRAVENOUS

## 2013-10-16 MED ORDER — NEOSTIGMINE METHYLSULFATE 1 MG/ML IJ SOLN
INTRAMUSCULAR | Status: DC | PRN
  Administered 2013-10-16: 3 mg via INTRAVENOUS

## 2013-10-16 MED ORDER — NEOSTIGMINE METHYLSULFATE 1 MG/ML IJ SOLN
INTRAMUSCULAR | Status: AC
  Filled 2013-10-16: qty 1

## 2013-10-16 MED ORDER — ONDANSETRON HCL 4 MG/2ML IJ SOLN
INTRAMUSCULAR | Status: AC
  Filled 2013-10-16: qty 2

## 2013-10-16 MED ORDER — CEFAZOLIN SODIUM-DEXTROSE 2-3 GM-% IV SOLR
2.0000 g | INTRAVENOUS | Status: AC
  Administered 2013-10-16: 2 g via INTRAVENOUS

## 2013-10-16 MED ORDER — TRAZODONE HCL 100 MG PO TABS
100.0000 mg | ORAL_TABLET | Freq: Every day | ORAL | Status: DC
  Administered 2013-10-16: 100 mg via ORAL
  Filled 2013-10-16: qty 1

## 2013-10-16 MED ORDER — ONDANSETRON HCL 4 MG/2ML IJ SOLN
INTRAMUSCULAR | Status: DC | PRN
  Administered 2013-10-16: 4 mg via INTRAVENOUS

## 2013-10-16 MED ORDER — TRAMADOL HCL 50 MG PO TABS: 50.0000 mg | ORAL_TABLET | Freq: Four times a day (QID) | ORAL | Status: DC | PRN

## 2013-10-16 MED ORDER — FLUOXETINE HCL 10 MG PO CAPS
10.0000 mg | ORAL_CAPSULE | Freq: Every day | ORAL | Status: DC
  Filled 2013-10-16 (×2): qty 1

## 2013-10-16 MED ORDER — SODIUM CHLORIDE 0.9 % IJ SOLN: 9.0000 mL | INTRAMUSCULAR | Status: DC | PRN

## 2013-10-16 MED ORDER — FENTANYL CITRATE 0.05 MG/ML IJ SOLN
INTRAMUSCULAR | Status: AC
  Administered 2013-10-16: 50 ug via INTRAVENOUS
  Filled 2013-10-16: qty 2

## 2013-10-16 MED ORDER — DEXAMETHASONE SODIUM PHOSPHATE 10 MG/ML IJ SOLN
INTRAMUSCULAR | Status: AC
  Filled 2013-10-16: qty 1

## 2013-10-16 MED ORDER — GLYCOPYRROLATE 0.2 MG/ML IJ SOLN
INTRAMUSCULAR | Status: AC
  Filled 2013-10-16: qty 3

## 2013-10-16 MED ORDER — NALOXONE HCL 0.4 MG/ML IJ SOLN: 0.4000 mg | INTRAMUSCULAR | Status: DC | PRN

## 2013-10-16 MED ORDER — METOCLOPRAMIDE HCL 5 MG/ML IJ SOLN: 10.0000 mg | Freq: Once | INTRAMUSCULAR | Status: DC | PRN

## 2013-10-16 MED ORDER — DIPHENHYDRAMINE HCL 50 MG/ML IJ SOLN: 12.5000 mg | Freq: Four times a day (QID) | INTRAMUSCULAR | Status: DC | PRN

## 2013-10-16 MED ORDER — MIDAZOLAM HCL 2 MG/2ML IJ SOLN
INTRAMUSCULAR | Status: DC | PRN
  Administered 2013-10-16: 2 mg via INTRAVENOUS

## 2013-10-16 MED ORDER — FENTANYL CITRATE 0.05 MG/ML IJ SOLN
25.0000 ug | INTRAMUSCULAR | Status: DC | PRN
  Administered 2013-10-16 (×3): 50 ug via INTRAVENOUS

## 2013-10-16 MED ORDER — GLYCOPYRROLATE 0.2 MG/ML IJ SOLN
INTRAMUSCULAR | Status: DC | PRN
  Administered 2013-10-16: .6 mg via INTRAVENOUS

## 2013-10-16 MED ORDER — PROPOFOL 10 MG/ML IV EMUL
INTRAVENOUS | Status: AC
  Filled 2013-10-16: qty 20

## 2013-10-16 MED ORDER — FENTANYL CITRATE 0.05 MG/ML IJ SOLN
INTRAMUSCULAR | Status: AC
  Filled 2013-10-16: qty 2

## 2013-10-16 MED ORDER — CEFAZOLIN SODIUM-DEXTROSE 2-3 GM-% IV SOLR
INTRAVENOUS | Status: AC
  Filled 2013-10-16: qty 50

## 2013-10-16 MED ORDER — OXYCODONE-ACETAMINOPHEN 5-325 MG PO TABS
1.0000 | ORAL_TABLET | ORAL | Status: DC | PRN
  Administered 2013-10-16 – 2013-10-17 (×6): 2 via ORAL
  Filled 2013-10-16 (×6): qty 2

## 2013-10-16 MED ORDER — OSELTAMIVIR PHOSPHATE 75 MG PO CAPS
75.0000 mg | ORAL_CAPSULE | Freq: Every day | ORAL | Status: DC
  Filled 2013-10-16 (×2): qty 1

## 2013-10-16 MED ORDER — SODIUM CHLORIDE 0.9 % IJ SOLN
INTRAMUSCULAR | Status: AC
  Filled 2013-10-16: qty 50

## 2013-10-16 MED ORDER — HYDROMORPHONE 0.3 MG/ML IV SOLN
INTRAVENOUS | Status: DC
  Filled 2013-10-16: qty 25

## 2013-10-16 MED ORDER — DEXAMETHASONE SODIUM PHOSPHATE 10 MG/ML IJ SOLN
INTRAMUSCULAR | Status: DC | PRN
  Administered 2013-10-16: 10 mg via INTRAVENOUS

## 2013-10-16 MED ORDER — SCOPOLAMINE 1 MG/3DAYS TD PT72
MEDICATED_PATCH | TRANSDERMAL | Status: AC
  Administered 2013-10-16: 1.5 mg via TRANSDERMAL
  Filled 2013-10-16: qty 1

## 2013-10-16 MED ORDER — ROPIVACAINE HCL 5 MG/ML IJ SOLN
INTRAMUSCULAR | Status: DC | PRN
  Administered 2013-10-16: 10 mL

## 2013-10-16 SURGICAL SUPPLY — 71 items
ADH SKN CLS APL DERMABOND .7 (GAUZE/BANDAGES/DRESSINGS) ×3
BAG URINE DRAINAGE (UROLOGICAL SUPPLIES) ×4 IMPLANT
BARRIER ADHS 3X4 INTERCEED (GAUZE/BANDAGES/DRESSINGS) ×4 IMPLANT
BRR ADH 4X3 ABS CNTRL BYND (GAUZE/BANDAGES/DRESSINGS) ×3
CATH FOLEY 3WAY  5CC 16FR (CATHETERS) ×1
CATH FOLEY 3WAY 5CC 16FR (CATHETERS) ×3 IMPLANT
CHLORAPREP W/TINT 26ML (MISCELLANEOUS) ×4 IMPLANT
CLOTH BEACON ORANGE TIMEOUT ST (SAFETY) ×4 IMPLANT
CONT PATH 16OZ SNAP LID 3702 (MISCELLANEOUS) ×4 IMPLANT
COVER MAYO STAND STRL (DRAPES) ×4 IMPLANT
COVER TABLE BACK 60X90 (DRAPES) ×8 IMPLANT
COVER TIP SHEARS 8 DVNC (MISCELLANEOUS) ×3 IMPLANT
COVER TIP SHEARS 8MM DA VINCI (MISCELLANEOUS) ×1
DECANTER SPIKE VIAL GLASS SM (MISCELLANEOUS) ×6 IMPLANT
DERMABOND ADVANCED (GAUZE/BANDAGES/DRESSINGS) ×1
DERMABOND ADVANCED .7 DNX12 (GAUZE/BANDAGES/DRESSINGS) ×3 IMPLANT
DRAPE HUG U DISPOSABLE (DRAPE) ×4 IMPLANT
DRAPE LG THREE QUARTER DISP (DRAPES) ×8 IMPLANT
DRAPE WARM FLUID 44X44 (DRAPE) ×4 IMPLANT
ELECT REM PT RETURN 9FT ADLT (ELECTROSURGICAL) ×4
ELECTRODE REM PT RTRN 9FT ADLT (ELECTROSURGICAL) ×3 IMPLANT
EVACUATOR SMOKE 8.L (FILTER) ×4 IMPLANT
GAUZE VASELINE 3X9 (GAUZE/BANDAGES/DRESSINGS) IMPLANT
GLOVE BIO SURGEON STRL SZ7.5 (GLOVE) ×8 IMPLANT
GOWN PREVENTION PLUS XLARGE (GOWN DISPOSABLE) ×4 IMPLANT
GOWN STRL REIN XL XLG (GOWN DISPOSABLE) ×24 IMPLANT
GYRUS RUMI II 2.5CM BLUE (DISPOSABLE)
GYRUS RUMI II 3.5CM BLUE (DISPOSABLE)
GYRUS RUMI II 4.0CM BLUE (DISPOSABLE)
KIT ACCESSORY DA VINCI DISP (KITS) ×1
KIT ACCESSORY DVNC DISP (KITS) ×3 IMPLANT
LEGGING LITHOTOMY PAIR STRL (DRAPES) ×4 IMPLANT
NDL INSUFFLATION 14GA 150MM (NEEDLE) ×2 IMPLANT
NEEDLE INSUFFLATION 14GA 150MM (NEEDLE) ×4 IMPLANT
NEEDLE INSUFFLATION 150MM (ENDOMECHANICALS) ×4 IMPLANT
PACK LAVH (CUSTOM PROCEDURE TRAY) ×4 IMPLANT
PAD PREP 24X48 CUFFED NSTRL (MISCELLANEOUS) ×8 IMPLANT
PLUG CATH AND CAP STER (CATHETERS) ×4 IMPLANT
PROTECTOR NERVE ULNAR (MISCELLANEOUS) ×8 IMPLANT
RUMI II 3.0CM BLUE KOH-EFFICIE (DISPOSABLE) IMPLANT
RUMI II GYRUS 2.5CM BLUE (DISPOSABLE) IMPLANT
RUMI II GYRUS 3.5CM BLUE (DISPOSABLE) IMPLANT
RUMI II GYRUS 4.0CM BLUE (DISPOSABLE) IMPLANT
SET CYSTO W/LG BORE CLAMP LF (SET/KITS/TRAYS/PACK) IMPLANT
SET IRRIG TUBING LAPAROSCOPIC (IRRIGATION / IRRIGATOR) ×4 IMPLANT
SOLUTION ELECTROLUBE (MISCELLANEOUS) ×4 IMPLANT
SUT VIC AB 0 CT1 27 (SUTURE) ×8
SUT VIC AB 0 CT1 27XBRD ANBCTR (SUTURE) ×6 IMPLANT
SUT VIC AB 0 CT1 27XBRD ANTBC (SUTURE) IMPLANT
SUT VICRYL 0 UR6 27IN ABS (SUTURE) ×4 IMPLANT
SUT VICRYL RAPIDE 4/0 PS 2 (SUTURE) ×8 IMPLANT
SUT VLOC 180 0 9IN  GS21 (SUTURE) ×1
SUT VLOC 180 0 9IN GS21 (SUTURE) ×1 IMPLANT
SYR 50ML LL SCALE MARK (SYRINGE) ×6 IMPLANT
SYR CONTROL 10ML LL (SYRINGE) ×2 IMPLANT
SYRINGE 10CC LL (SYRINGE) ×4 IMPLANT
SYSTEM CONVERTIBLE TROCAR (TROCAR) IMPLANT
TIP UTERINE 5.1X6CM LAV DISP (MISCELLANEOUS) IMPLANT
TIP UTERINE 6.7X10CM GRN DISP (MISCELLANEOUS) IMPLANT
TIP UTERINE 6.7X6CM WHT DISP (MISCELLANEOUS) IMPLANT
TIP UTERINE 6.7X8CM BLUE DISP (MISCELLANEOUS) ×2 IMPLANT
TOWEL OR 17X24 6PK STRL BLUE (TOWEL DISPOSABLE) ×12 IMPLANT
TROCAR BLADELESS OPT 12M 100M (ENDOMECHANICALS) IMPLANT
TROCAR DISP BLADELESS 8 DVNC (TROCAR) ×3 IMPLANT
TROCAR DISP BLADELESS 8MM (TROCAR) ×1
TROCAR XCEL 12X100 BLDLESS (ENDOMECHANICALS) ×2 IMPLANT
TROCAR XCEL NON-BLD 5MMX100MML (ENDOMECHANICALS) ×4 IMPLANT
TROCAR Z-THREAD 12X150 (TROCAR) ×4 IMPLANT
TUBING FILTER THERMOFLATOR (ELECTROSURGICAL) ×4 IMPLANT
WARMER LAPAROSCOPE (MISCELLANEOUS) ×4 IMPLANT
WATER STERILE IRR 1000ML POUR (IV SOLUTION) ×12 IMPLANT

## 2013-10-16 NOTE — Anesthesia Postprocedure Evaluation (Signed)
  Anesthesia Post-op Note  Patient: [name]  Procedure(s) Performed: Procedure(s): ROBOTIC ASSISTED TOTAL HYSTERECTOMY (N/A) BILATERAL SALPINGECTOMY (Bilateral)  Patient Location: PACU and Women's Unit  Anesthesia Type:General  Level of Consciousness: awake, alert  and oriented  Airway and Oxygen Therapy: Patient Spontanous Breathing  Post-op Pain: mild  Post-op Assessment: Patient's Cardiovascular Status Stable, Respiratory Function Stable, No signs of Nausea or vomiting and Pain level controlled  Post-op Vital Signs: stable  Complications: No apparent anesthesia complications

## 2013-10-16 NOTE — Anesthesia Postprocedure Evaluation (Signed)
Anesthesia Post Note  Patient: [name]  Procedure(s) Performed: Procedure(s) (LRB): ROBOTIC ASSISTED TOTAL HYSTERECTOMY (N/A) BILATERAL SALPINGECTOMY (Bilateral)  Anesthesia type: GA  Patient location: PACU  Post pain: Pain level controlled  Post assessment: Post-op Vital signs reviewed  Last Vitals:  Filed Vitals:   10/16/13 1045  BP: 93/35  Pulse: 107  Temp:   Resp: 16    Post vital signs: Reviewed  Level of consciousness: sedated  Complications: No apparent anesthesia complications

## 2013-10-16 NOTE — Op Note (Signed)
10/16/2013  9:42 AM  PATIENT:  [name]  42 y.o. female  PRE-OPERATIVE DIAGNOSIS:  Pelvic Pain   Refractory Menorrhagia Symptomatic fibroids  POST-OPERATIVE DIAGNOSIS:  Pelvic Pain and above Pelvic adhesions Enterocele  PROCEDURE:  Procedure(s): ROBOTIC ASSISTED TOTAL HYSTERECTOMY BILATERAL SALPINGECTOMY LYSIS OF ADHESIONS MCCALL CUL DE PLASTY  SURGEON:  Surgeon(s): Lenoard Aden, MD  ASSISTANTSFredric Mare, CNM  ANESTHESIA:   local and general  ESTIMATED BLOOD LOSS: 50CC  DRAINS: Urinary Catheter (Foley)   LOCAL MEDICATIONS USED:  MARCAINE     SPECIMEN:  Source of Specimen:  UTERUS . CERVIX  AND BILATERAL TUBAL SEGMENTS  DISPOSITION OF SPECIMEN:  PATHOLOGY  COUNTS:  YES  DICTATION #: [id number]  PLAN OF CARE: DC HOME  PATIENT DISPOSITION:  PACU - hemodynamically stable.

## 2013-10-16 NOTE — Anesthesia Preprocedure Evaluation (Addendum)
Anesthesia Evaluation  Patient identified by MRN, date of birth, ID band Patient awake    Reviewed: Allergy & Precautions, H&P , NPO status , Patient's Chart, lab work & pertinent test results  Airway Mallampati: III TM Distance: >3 FB Neck ROM: Full    Dental no notable dental hx. (+) Teeth Intact   Pulmonary neg pulmonary ROS,  breath sounds clear to auscultation  Pulmonary exam normal       Cardiovascular negative cardio ROS  Rhythm:Regular Rate:Normal     Neuro/Psych PSYCHIATRIC DISORDERS Anxiety Depression ADDnegative neurological ROS     GI/Hepatic negative GI ROS, Neg liver ROS,   Endo/Other  Obesity  Renal/GU Hx/o renal calculi  negative genitourinary   Musculoskeletal   Abdominal (+) - obese,   Peds  Hematology negative hematology ROS (+)   Anesthesia Other Findings   Reproductive/Obstetrics Chronic pelvic pain                         Anesthesia Physical Anesthesia Plan  ASA: II  Anesthesia Plan: General   Post-op Pain Management:    Induction: Intravenous  Airway Management Planned: Oral ETT  Additional Equipment:   Intra-op Plan:   Post-operative Plan: Extubation in OR  Informed Consent: I have reviewed the patients History and Physical, chart, labs and discussed the procedure including the risks, benefits and alternatives for the proposed anesthesia with the patient or authorized representative who has indicated his/her understanding and acceptance.   Dental advisory given  Plan Discussed with: CRNA, Anesthesiologist and Surgeon  Anesthesia Plan Comments:         Anesthesia Quick Evaluation

## 2013-10-16 NOTE — Preoperative (Signed)
Beta Blockers   Reason not to administer Beta Blockers:Not Applicable 

## 2013-10-16 NOTE — Transfer of Care (Signed)
Immediate Anesthesia Transfer of Care Note  Patient: [name]  Procedure(s) Performed: Procedure(s): ROBOTIC ASSISTED TOTAL HYSTERECTOMY (N/A) BILATERAL SALPINGECTOMY (Bilateral)  Patient Location: PACU  Anesthesia Type:General  Level of Consciousness: awake, alert  and oriented  Airway & Oxygen Therapy: Patient Spontanous Breathing and Patient connected to nasal cannula oxygen  Post-op Assessment: Report given to PACU RN, Post -op Vital signs reviewed and stable and Patient moving all extremities  Post vital signs: Reviewed and stable  Complications: No apparent anesthesia complications

## 2013-10-16 NOTE — Progress Notes (Signed)
Patient ID: [name], female   DOB: October 21, 1970, 42 y.o.   MRN: [id number] Patient seen and examined. Consent witnessed and signed. No changes noted. Update completed. CBC    Component Value Date/Time   WBC 6.4 10/15/2013 1504   RBC 4.24 10/15/2013 1504   HGB 12.8 10/15/2013 1504   HCT 37.4 10/15/2013 1504   PLT 226 10/15/2013 1504   MCV 88.2 10/15/2013 1504   MCH 30.2 10/15/2013 1504   MCHC 34.2 10/15/2013 1504   RDW 12.7 10/15/2013 1504   LYMPHSABS 1.5 06/17/2009 0515   MONOABS 0.4 06/17/2009 0515   EOSABS 0.3 06/17/2009 0515   BASOSABS 0.0 06/17/2009 0515

## 2013-10-17 ENCOUNTER — Encounter (HOSPITAL_COMMUNITY): Payer: Self-pay | Admitting: Obstetrics and Gynecology

## 2013-10-17 LAB — CBC
HCT: 31.7 % — ABNORMAL LOW (ref 36.0–46.0)
Hemoglobin: 10.7 g/dL — ABNORMAL LOW (ref 12.0–15.0)
MCH: 30.1 pg (ref 26.0–34.0)
MCHC: 33.8 g/dL (ref 30.0–36.0)
MCV: 89 fL (ref 78.0–100.0)
Platelets: 218 10*3/uL (ref 150–400)
RBC: 3.56 MIL/uL — ABNORMAL LOW (ref 3.87–5.11)
RDW: 12.7 % (ref 11.5–15.5)
WBC: 11.5 10*3/uL — ABNORMAL HIGH (ref 4.0–10.5)

## 2013-10-17 LAB — BASIC METABOLIC PANEL
BUN: 13 mg/dL (ref 6–23)
CO2: 29 mEq/L (ref 19–32)
Calcium: 8.6 mg/dL (ref 8.4–10.5)
Chloride: 100 mEq/L (ref 96–112)
Creatinine, Ser: 0.96 mg/dL (ref 0.50–1.10)
GFR calc Af Amer: 82 mL/min — ABNORMAL LOW (ref >90)
GFR calc non Af Amer: 71 mL/min — ABNORMAL LOW (ref >90)
Glucose, Bld: 112 mg/dL — ABNORMAL HIGH (ref 70–99)
Potassium: 4.2 mEq/L (ref 3.7–5.3)
Sodium: 137 mEq/L (ref 137–147)

## 2013-10-17 MED ORDER — TRAMADOL HCL 50 MG PO TABS: 50.0000 mg | ORAL_TABLET | Freq: Four times a day (QID) | ORAL | Status: DC | PRN

## 2013-10-17 MED ORDER — OXYCODONE-ACETAMINOPHEN 5-325 MG PO TABS: 1.0000 | ORAL_TABLET | ORAL | Status: DC | PRN

## 2013-10-17 NOTE — Progress Notes (Signed)
1 Day Post-Op Procedure(s) (LRB): ROBOTIC ASSISTED TOTAL HYSTERECTOMY (N/A) BILATERAL SALPINGECTOMY (Bilateral)  Subjective: Patient reports nausea, incisional pain, tolerating PO, + flatus, + BM and no problems voiding.    Objective: I have reviewed patient's vital signs, intake and output and labs.  General: alert, cooperative and appears stated age Resp: clear to auscultation bilaterally and normal percussion bilaterally Cardio: regular rate and rhythm, S1, S2 normal, no murmur, click, rub or gallop GI: soft, non-tender; bowel sounds normal; no masses,  no organomegaly, normal findings: aorta normal and incision: clean, dry and intact Extremities: extremities normal, atraumatic, no cyanosis or edema and Homans sign is negative, no sign of DVT Vaginal Bleeding: minimal  Assessment: s/p Procedure(s): ROBOTIC ASSISTED TOTAL HYSTERECTOMY (N/A) BILATERAL SALPINGECTOMY (Bilateral): stable, progressing well and tolerating diet  Plan: Advance diet Encourage ambulation Advance to PO medication Discontinue IV fluids Discharge home FU office 2 weeks.  LOS: 1 day    Elizabeht Suto J 10/17/2013, 9:34 AM

## 2013-10-17 NOTE — Op Note (Signed)
NAMESELIN, [name]                ACCOUNT NO.:  [id number]  MEDICAL RECORD NO.:  [id number]  LOCATION:  9309                          FACILITY:  [facility]  PHYSICIAN:  Lenoard Aden, M.D.DATE OF BIRTH:  [birth year]  DATE OF PROCEDURE:  10/16/2013 DATE OF DISCHARGE:                              OPERATIVE REPORT   PREOPERATIVE DIAGNOSES: 1. Symptomatic refractory menorrhagia. 2. Symptomatic fibroids.  POSTOPERATIVE DIAGNOSES: 1. Symptomatic refractory menorrhagia. 2. Symptomatic fibroids plus pelvic adhesions plus enterocele.  PROCEDURES:  Da Vinci assisted total laparoscopic hysterectomy, bilateral salpingectomy, lysis of adhesions, McCall culdoplasty.  SURGEON:  Lenoard Aden, M.D.  ASSISTANT:  Fredric Mare.  ESTIMATED BLOOD LOSS:  50 mL.  COMPLICATIONS:  None.  DRAINS:  Foley.  COUNTS:  Correct.  The patient to recovery in good condition.  DESCRIPTION OF PROCEDURE:  After being apprised of risks of anesthesia, infection, bleeding, injury to surrounding organs, possible need for repair, delayed versus immediate complications to include bowel and bladder injury, possible need for repair, the patient was brought to the operating room where she was administered a general anesthetic without complications, prepped and draped in usual sterile fashion.  Foley catheter placed after achieving adequate anesthesia, dilute Marcaine solution placed in the area of an infraumbilical incision, which was made with a scalpel.  Veress needle placed, opening pressure -2-3 liters of CO2 insufflated without difficulty.  Trocar was placed atraumatically.  Visualization reveals anterior uterine fibroid, bladder adhesions to the fibroid, and anterior cul-de-sac.  There were bilateral tubal segments noted after previous tubal ligation.  Normal ovaries. Some adhesions of the right tube to the pelvic sidewall.  The robotic instruments and ports were then placed, one on the left, one on the right,  and a 5 mm on the left.  Trocars placed atraumatically under direct visualization.  Steep Trendelenburg positioning, established and the robot was docked in a standard fashion using PK forceps and Endo Shears.  The mesosalpinx on the left was divided.  The retroperitoneal space was entered.  The ureter was identified.  The tubo-ovarian ligament was cauterized and cut.  The posterior leaf of the broad ligament was developed and the round ligament was opened.  Lysis of adhesions along the anterior cul-de-sac performed, freeing up the bladder flap and exposing the cervical vaginal junction where the RUMI cup was seemed to be bulging in the standard fashion.  The uterine vessels on the left were then skeletonized, cauterized, and cut.  At this time on the right, the adhesions to the right tube were lysed, the right ureter was identified.  The right mesosalpinx was divided.  The retroperitoneal space was entered.  The ureter was identified.  The tubo- ovarian ligament was cauterized and cut.  The round ligament was divided.  The retroperitoneal space was further divided.  The bladder flap was further developed.  The uterine vessels on the right were skeletonized, cauterized, and divided.  At this time, the bladder flap having been well-established.  The RUMI cup was circumscribed in a circumferential 360-degree fashion, dividing the specimen, which then retracted into the vagina and removed sent for permanent section.  The occluder cup was then replaced.  Hemostasis was achieved.  The vaginal incision was closed using a 0 V-Loc suture in a continuous running fashion.  A second imbricating layer placed and a McCall culdoplasty suture performed as well in a standard fashion.  Both ureters seemed to be peristalsing bilaterally normally at the end of the case.  Urine was clear.  Vaginal exam confirmed a well-approximated vaginal closure.  The robot was undocked.  Incisions were then closed after  removal of all trocars under direct visualization.  The incisions were closed using 0 Vicryl, 4-0 Vicryl, and Dermabond.  The patient tolerated the procedure well, was awakened, and transferred to recovery in good condition.     Lenoard Aden, M.D.     RJT/MEDQ  D:  10/16/2013  T:  10/17/2013  Job:  [id number]

## 2013-10-17 NOTE — Progress Notes (Signed)
Pt. Is discharged in the care ofhusband. Downstairs per ambulatory with with N.T. Escort. Stable, Discharged instructions were given to pt. Questions asked and answered..stable.

## 2013-11-25 ENCOUNTER — Ambulatory Visit: Payer: 59 | Admitting: Diagnostic Neuroimaging

## 2014-01-08 ENCOUNTER — Ambulatory Visit (INDEPENDENT_AMBULATORY_CARE_PROVIDER_SITE_OTHER): Payer: 59 | Admitting: Neurology

## 2014-01-08 ENCOUNTER — Encounter: Payer: Self-pay | Admitting: Neurology

## 2014-01-08 ENCOUNTER — Encounter (INDEPENDENT_AMBULATORY_CARE_PROVIDER_SITE_OTHER): Payer: Self-pay

## 2014-01-08 VITALS — BP 110/76 | HR 89 | Temp 97.7°F | Ht 63.0 in | Wt 178.0 lb

## 2014-01-08 DIAGNOSIS — G479 Sleep disorder, unspecified: Secondary | ICD-10-CM | POA: Insufficient documentation

## 2014-01-08 DIAGNOSIS — G478 Other sleep disorders: Secondary | ICD-10-CM

## 2014-01-08 DIAGNOSIS — G4710 Hypersomnia, unspecified: Secondary | ICD-10-CM

## 2014-01-08 DIAGNOSIS — G4734 Idiopathic sleep related nonobstructive alveolar hypoventilation: Secondary | ICD-10-CM

## 2014-01-08 DIAGNOSIS — R7981 Abnormal blood-gas level: Secondary | ICD-10-CM

## 2014-01-08 DIAGNOSIS — G4733 Obstructive sleep apnea (adult) (pediatric): Secondary | ICD-10-CM

## 2014-01-08 DIAGNOSIS — F988 Other specified behavioral and emotional disorders with onset usually occurring in childhood and adolescence: Secondary | ICD-10-CM

## 2014-01-08 DIAGNOSIS — R0902 Hypoxemia: Secondary | ICD-10-CM

## 2014-01-08 DIAGNOSIS — R400 Somnolence: Secondary | ICD-10-CM

## 2014-01-08 HISTORY — DX: Sleep disorder, unspecified: G47.9

## 2014-01-08 NOTE — Progress Notes (Signed)
Subjective:    Patient ID: EARLY ORD is a 43 y.o. female.  HPI    Star Age, MD, PhD [facility] [address], [address] [place], [place] [address]  Dear Ulice Dash,   I saw your patient, [name], upon your kind request in my neurologic clinic today for initial consultation of her sleep disorder, in particular, concern for obstructive sleep apnea. The patient is accompanied by her husband today. As you know, [name] is a very friendly 43 year old right-handed woman with an underlying medical history of anxiety, depression, arthritis, chronic back pain, and atypical chest pain, who recently had an abnormal overnight pulse oximetry test. I reviewed her ONO test results from 11/27/2013: Her awake oxygen saturation was 97%, lowest oxygen saturation was reported as 57% but this appears to be an error. This happened right in the beginning of the test and is probably an erroneous finding. Her lowest oxygen saturation appears to be 80%. Time below 88% saturation was 6.9 minutes. Time below 89% saturation was 9.3 minutes. Average sleep oxygen saturation was 94.5%. As far as cardiac workup: She had an echocardiogram on 12/16/2013: She had normal global wall motion, EF was 60%, she had grade 1 diastolic dysfunction, trace mitral regurgitation, trace tricuspid regurgitation and overall benign findings. She had an exercise stress test on 12/23/2013 which showed normal findings.  She has had CP intermittently some 4 years ago. She sees Dr. Toy Care in psychiatry. Of note, she does prn psychotropic medications, which can be sedating, including flexeril, trazodone, temazepam and Xanax. She takes Adderall for ADD. She no longer takes gabapentin, percocet, or ultram and she has had difficulty achieving sleep and maintaining sleep for years. She has been on Ambien, she tried melatonin. She c/o non-restorative sleep and disrupted sleep. Her husband does endorses that she snores and  this has become worse. She currently works in an office job, full time. She denies waking up gasping for air, but her husband has witnessed apneas and shallow breathing events.   She had a hysterectomy in 12/14. She has a 45 yo and a 45 yo.  Her typical bedtime is reported to be around 10 PM and usual wake time is around 6:30 - 6:45 AM. Sleep onset typically occurs after 2-3 hours. She takes something for sleep on 4 nights out of the week. She reports feeling poorly rested upon awakening. She wakes up on an average 2 times in the middle of the night and has to go to the bathroom 1 times on a typical night. She reports occasional morning headaches. She reports occasional migraines, associated with photophobia and nausea.  She reports excessive daytime somnolence (EDS) and Her Epworth Sleepiness Score (ESS) is 9/24 today. She has not fallen asleep while driving. The patient has been taking naps on the weekends, when possible.  She has been known to snore for the past few years. Snoring is reportedly moderate, and associated with choking sounds and witnessed apneas. The patient denies a sense of choking or strangling feeling. There is no report of nighttime reflux, with no nighttime cough experienced. The patient has not noted any RLS symptoms and is not known to kick while asleep or before falling asleep. There is no family history of RLS or OSA, her mother snores and sleeps poorly.  She is a restless sleeper and in the morning, the bed is quite disheveled.   She denies cataplexy, sleep paralysis, hypnagogic or hypnopompic hallucinations, or sleep attacks. She does not report  any vivid dreams, nightmares, dream enactments, or parasomnias, such as sleep talking or sleep walking. The patient has not had a sleep study or a home sleep test.  She consumes 3 to 5 caffeinated beverages per day, usually in the form of coffee, sodas, and sweet tea.  Her bedroom is usually dark and cool. There is a TV in the bedroom and  usually it is on at night.  She had mandibular surgery as a teenager for malalignment.  Her Past Medical History Is Significant For: Past Medical History  Diagnosis Date  . Anxiety   . Depression   . Disc degeneration   . ADD (attention deficit disorder)   . History of kidney stones     Her Past Surgical History Is Significant For: Past Surgical History  Procedure Laterality Date  . Mandible surgery    . Cholecystectomy    . Cesarean section      x2  . Diagnostic laparoscopy    . Robotic assisted total hysterectomy N/A 10/16/2013    Procedure: ROBOTIC ASSISTED TOTAL HYSTERECTOMY;  Surgeon: Lovenia Kim, MD;  Location: [facility];  Service: Gynecology;  Laterality: N/A;  . Bilateral salpingectomy Bilateral 10/16/2013    Procedure: BILATERAL SALPINGECTOMY;  Surgeon: Lovenia Kim, MD;  Location: [facility];  Service: Gynecology;  Laterality: Bilateral;    Her Family History Is Significant For: No family history on file.  Her Social History Is Significant For: History   Social History  . Marital Status: Married    Spouse Name: N/A    Number of Children: N/A  . Years of Education: N/A   Social History Main Topics  . Smoking status: Never Smoker   . Smokeless tobacco: None  . Alcohol Use: Yes     Comment: socially  . Drug Use: No  . Sexual Activity: None   Other Topics Concern  . None   Social History Narrative  . None    Her Allergies Are:  Allergies  Allergen Reactions  . Sulfa Antibiotics Hives    All over body  :   Her Current Medications Are:  Outpatient Encounter Prescriptions as of 01/08/2014  Medication Sig  . ALPRAZolam (XANAX) 1 MG tablet Take 1 mg by mouth at bedtime as needed for anxiety or sleep.  Marland Kitchen amphetamine-dextroamphetamine (ADDERALL XR) 25 MG 24 hr capsule Take 25 mg by mouth every morning.  . cyclobenzaprine (FLEXERIL) 10 MG tablet Take 10 mg by mouth 3 (three) times daily as needed.   . diclofenac (VOLTAREN) 75 MG EC tablet Take 75 mg  by mouth 2 (two) times daily as needed (inflammation).  Marland Kitchen FLUoxetine (PROZAC) 10 MG capsule Take 10 mg by mouth daily.  . temazepam (RESTORIL) 30 MG capsule Take 1 capsule by mouth at bedtime as needed and may repeat dose one time if needed.  . traZODone (DESYREL) 50 MG tablet Take 100 mg by mouth at bedtime.  . gabapentin (NEURONTIN) 100 MG capsule   . oxyCODONE-acetaminophen (PERCOCET/ROXICET) 5-325 MG per tablet Take 1-2 tablets by mouth every 4 (four) hours as needed for severe pain.  . traMADol (ULTRAM) 50 MG tablet Take 1-2 tablets (50-100 mg total) by mouth every 6 (six) hours as needed for moderate pain.  :  Review of Systems:  Out of a complete 14 point review of systems, all are reviewed and negative with the exception of these symptoms as listed below:   Review of Systems  Constitutional: Positive for fatigue.  Eyes: Negative.   Respiratory: Positive  for apnea (snoring) and shortness of breath.   Cardiovascular: Positive for chest pain.  Gastrointestinal: Positive for diarrhea and constipation.       Incontinence  Endocrine: Positive for heat intolerance.  Genitourinary: Negative.   Musculoskeletal: Positive for arthralgias, joint swelling and myalgias.  Skin: Negative.   Allergic/Immunologic: Positive for environmental allergies.  Neurological: Positive for headaches.       Memory loss  Hematological: Negative.   Psychiatric/Behavioral: Positive for confusion, sleep disturbance (e.d.s, snoring, insomnia), dysphoric mood and decreased concentration. The patient is nervous/anxious.     Objective:  Neurologic Exam  Physical Exam Physical Examination:   Filed Vitals:   01/08/14 0956  BP: 110/76  Pulse: 89  Temp: 97.7 F (36.5 C)    General Examination: The patient is a very pleasant 43 y.o. female in no acute distress. She appears well-developed and well-nourished and well groomed.   HEENT: Normocephalic, atraumatic, pupils are equal, round and reactive to light  and accommodation. Funduscopic exam is normal with sharp disc margins noted. Extraocular tracking is good without limitation to gaze excursion or nystagmus noted. Normal smooth pursuit is noted. Hearing is grossly intact. Tympanic membranes are clear bilaterally. Face is symmetric with normal facial animation and normal facial sensation. Speech is clear with no dysarthria noted. There is no hypophonia. There is no lip, neck/head, jaw or voice tremor. Neck is supple with full range of passive and active motion. There are no carotid bruits on auscultation. Oropharynx exam reveals: mild mouth dryness, good dental hygiene and mild airway crowding, due to larger tongue, narrow airway entry and tonsils. Mallampati is class I. Tongue protrudes centrally and palate elevates symmetrically. Tonsils are 1+ in size. Neck size is 13 inches.   Chest: Clear to auscultation without wheezing, rhonchi or crackles noted.  Heart: S1+S2+0, regular and normal without murmurs, rubs or gallops noted.   Abdomen: Soft, non-tender and non-distended with normal bowel sounds appreciated on auscultation.  Extremities: There is no pitting edema in the distal lower extremities bilaterally. Pedal pulses are intact.  Skin: Warm and dry without trophic changes noted. There are no varicose veins.  Musculoskeletal: exam reveals no obvious joint deformities, tenderness or joint swelling or erythema. She has some LBP and L hip pain.   Neurologically:  Mental status: The patient is awake, alert and oriented in all 4 spheres. Her immediate and remote memory, attention, language skills and fund of knowledge are appropriate. There is no evidence of aphasia, agnosia, apraxia or anomia. Speech is clear with normal prosody and enunciation. Thought process is linear. Mood is anxious and affect is normal.  Cranial nerves II - XII are as described above under HEENT exam. In addition: shoulder shrug is normal with equal shoulder height noted. Motor  exam: Normal bulk, strength and tone is noted. There is no drift, tremor or rebound. Romberg is negative. Reflexes are 2+ throughout. Babinski: Toes are flexor bilaterally. Fine motor skills and coordination: intact with normal finger taps, normal hand movements, normal rapid alternating patting, normal foot taps and normal foot agility.  Cerebellar testing: No dysmetria or intention tremor on finger to nose testing. Heel to shin is unremarkable bilaterally, except for L hip pain. There is no truncal or gait ataxia.  Sensory exam: intact to light touch, pinprick, vibration, temperature sense and proprioception in the upper and lower extremities.  Gait, station and balance: She stands easily. No veering to one side is noted. No leaning to one side is noted. Posture is age-appropriate and stance  is narrow based. Gait shows normal stride length and normal pace. No problems turning are noted. She turns en bloc. Tandem walk is unremarkable. Intact toe and heel stance is noted.               Assessment and Plan:   In summary, [name] is a very pleasant 43 y.o.-year old female with an underlying medical history of anxiety, depression, arthritis, chronic back pain, and atypical chest pain, who recently had an abnormal overnight pulse oximetry test. Her history and physical exam along with her abnormal ONO are indeed, concerning for obstructive sleep apnea (OSA).  I had a long chat with the patient and her husband about my findings and the diagnosis of OSA, its prognosis and treatment options. We talked about medical treatments, surgical interventions and non-pharmacological approaches. I explained in particular the risks and ramifications of untreated moderate to severe OSA, especially with respect to developing cardiovascular disease down the Road, including congestive heart failure, difficult to treat hypertension, cardiac arrhythmias, or stroke. Even type 2 diabetes has, in part, been linked to untreated  OSA. Symptoms of untreated OSA include daytime sleepiness, memory problems, mood irritability and mood disorder such as depression and anxiety, lack of energy, as well as recurrent headaches, especially morning headaches. We talked about trying to maintain a healthy lifestyle in general, as well as the importance of weight control. I encouraged the patient to eat healthy, exercise daily and keep well hydrated, to keep a scheduled bedtime and wake time routine, to not skip any meals and eat healthy snacks in between meals. I advised the patient not to drive when feeling sleepy. I recommended the following at this time: sleep study with potential positive airway pressure titration.  I explained the sleep test procedure to the patient and also outlined possible surgical and non-surgical treatment options of OSA, including the use of a custom-made dental device (which would require a referral to a specialist dentist or oral surgeon), upper airway surgical options, such as pillar implants, radiofrequency surgery, tongue base surgery, and UPPP (which would involve a referral to an ENT surgeon). Rarely, jaw surgery such as mandibular advancement may be considered.  I also explained the CPAP treatment option to the patient, who indicated that she would be willing to try CPAP if the need arises. I explained the importance of being compliant with PAP treatment, not only for insurance purposes but primarily to improve Her symptoms, and for the patient's long term health benefit, including to reduce Her cardiovascular risks. I answered all their questions today and the patient was in agreement. I would like to see her back after the sleep study is completed and encouraged them to call with any interim questions, concerns, problems or updates.   Thank you very much for allowing me to participate in the care of this nice patient. If I can be of any further assistance to you please do not hesitate to call me at  [phone].  Sincerely,   Star Age, MD, PhD

## 2014-01-08 NOTE — Patient Instructions (Addendum)
Based on your symptoms, you pulse oximetry test, and your exam I believe you are at risk for obstructive sleep apnea or OSA, and I think we should proceed with a sleep study to determine whether you do or do not have OSA and how severe it is. If you have more than mild OSA, I want you to consider treatment with CPAP. Please remember, the risks and ramifications of moderate to severe obstructive sleep apnea or OSA are: Cardiovascular disease, including congestive heart failure, stroke, difficult to control hypertension, arrhythmias, and even type 2 diabetes has been linked to untreated OSA. Sleep apnea causes disruption of sleep and sleep deprivation in most cases, which, in turn, can cause recurrent headaches, problems with memory, mood, concentration, focus, and vigilance. Most people with untreated sleep apnea report excessive daytime sleepiness, which can affect their ability to drive. Please do not drive if you feel sleepy.  I will see you back after your sleep study to go over the test results and where to go from there. We will call you after your sleep study and to set up an appointment at the time.   Please remember to try to maintain good sleep hygiene, which means: Keep a regular sleep and wake schedule, try not to exercise or have a meal within 2 hours of your bedtime, try to keep your bedroom conducive for sleep, that is, cool and dark, without light distractors such as an illuminated alarm clock, and refrain from watching TV right before sleep or in the middle of the night and do not keep the TV or radio on during the night. Also, try not to use or play on electronic devices at bedtime, such as your cell phone, tablet PC or laptop. If you like to read at bedtime on an electronic device, try to dim the background light as much as possible. Do not eat in the middle of the night.   Reduce your caffeine intake and avoid caffeine after 5 PM.

## 2014-01-27 ENCOUNTER — Ambulatory Visit (INDEPENDENT_AMBULATORY_CARE_PROVIDER_SITE_OTHER): Payer: 59

## 2014-01-27 DIAGNOSIS — G4733 Obstructive sleep apnea (adult) (pediatric): Secondary | ICD-10-CM

## 2014-01-27 DIAGNOSIS — R0989 Other specified symptoms and signs involving the circulatory and respiratory systems: Secondary | ICD-10-CM

## 2014-01-27 DIAGNOSIS — G4734 Idiopathic sleep related nonobstructive alveolar hypoventilation: Secondary | ICD-10-CM

## 2014-01-27 DIAGNOSIS — G478 Other sleep disorders: Secondary | ICD-10-CM

## 2014-01-27 DIAGNOSIS — R400 Somnolence: Secondary | ICD-10-CM

## 2014-01-27 DIAGNOSIS — R0609 Other forms of dyspnea: Secondary | ICD-10-CM

## 2014-01-27 DIAGNOSIS — G479 Sleep disorder, unspecified: Secondary | ICD-10-CM

## 2014-01-27 DIAGNOSIS — R7981 Abnormal blood-gas level: Secondary | ICD-10-CM

## 2014-01-27 DIAGNOSIS — F988 Other specified behavioral and emotional disorders with onset usually occurring in childhood and adolescence: Secondary | ICD-10-CM

## 2014-01-27 DIAGNOSIS — G4700 Insomnia, unspecified: Secondary | ICD-10-CM

## 2014-02-03 ENCOUNTER — Telehealth: Payer: Self-pay | Admitting: Neurology

## 2014-02-03 NOTE — Telephone Encounter (Signed)
Please advise patient that if she fell and hit her head it is safest to proceed to the emergency room.

## 2014-02-03 NOTE — Telephone Encounter (Signed)
Patient hit her head last Thursday--patient feels better but not feeling her best--? concussion--please call.

## 2014-02-03 NOTE — Telephone Encounter (Signed)
Pt calling stating that she fell and has a concussion, pt states that she feels better, but not her best. Pt would like Dr. Rexene Alberts to give her a call back please. Thanks

## 2014-02-04 ENCOUNTER — Encounter (HOSPITAL_BASED_OUTPATIENT_CLINIC_OR_DEPARTMENT_OTHER): Payer: Self-pay | Admitting: Emergency Medicine

## 2014-02-04 ENCOUNTER — Emergency Department (HOSPITAL_BASED_OUTPATIENT_CLINIC_OR_DEPARTMENT_OTHER): Payer: 59

## 2014-02-04 ENCOUNTER — Emergency Department (HOSPITAL_BASED_OUTPATIENT_CLINIC_OR_DEPARTMENT_OTHER)
Admission: EM | Admit: 2014-02-04 | Discharge: 2014-02-04 | Disposition: A | Discharge status: Home / Self Care | Payer: 59 | Attending: Emergency Medicine | Admitting: Emergency Medicine

## 2014-02-04 DIAGNOSIS — F0781 Postconcussional syndrome: Secondary | ICD-10-CM | POA: Insufficient documentation

## 2014-02-04 DIAGNOSIS — Z79899 Other long term (current) drug therapy: Secondary | ICD-10-CM | POA: Insufficient documentation

## 2014-02-04 DIAGNOSIS — Z87442 Personal history of urinary calculi: Secondary | ICD-10-CM | POA: Insufficient documentation

## 2014-02-04 DIAGNOSIS — Y9301 Activity, walking, marching and hiking: Secondary | ICD-10-CM | POA: Insufficient documentation

## 2014-02-04 DIAGNOSIS — F329 Major depressive disorder, single episode, unspecified: Secondary | ICD-10-CM | POA: Insufficient documentation

## 2014-02-04 DIAGNOSIS — F489 Nonpsychotic mental disorder, unspecified: Secondary | ICD-10-CM | POA: Insufficient documentation

## 2014-02-04 DIAGNOSIS — W2209XA Striking against other stationary object, initial encounter: Secondary | ICD-10-CM | POA: Insufficient documentation

## 2014-02-04 DIAGNOSIS — F3289 Other specified depressive episodes: Secondary | ICD-10-CM | POA: Insufficient documentation

## 2014-02-04 DIAGNOSIS — Y929 Unspecified place or not applicable: Secondary | ICD-10-CM | POA: Insufficient documentation

## 2014-02-04 DIAGNOSIS — F988 Other specified behavioral and emotional disorders with onset usually occurring in childhood and adolescence: Secondary | ICD-10-CM | POA: Insufficient documentation

## 2014-02-04 DIAGNOSIS — F411 Generalized anxiety disorder: Secondary | ICD-10-CM | POA: Insufficient documentation

## 2014-02-04 NOTE — ED Notes (Signed)
Pt hit head on the 16,  Has soreness to top of head sluggish feeling,  Increased tension to neck and upper back, feels as if balance is off,  Per family member speech is slower than normal

## 2014-02-04 NOTE — Discharge Instructions (Signed)

## 2014-02-04 NOTE — ED Provider Notes (Signed)
CSN: [id number]     Arrival date & time 02/04/14  2055 History  This chart was scribed for Tracey Pollack, MD by Elby Beck, ED Scribe. This patient was seen in room MH07/MH07 and the patient's care was started at 9:58 PM.   Chief Complaint  Patient presents with  . Headache    Patient is a 43 y.o. female presenting with head injury. The history is provided by the patient. No language interpreter was used.  Head Injury Location:  Frontal Time since incident:  5 days Mechanism of injury: direct blow   Mechanism of injury comment:  Hit head on a metal box while walking upstairs Pain details:    Quality:  Unable to specify   Severity:  Mild   Duration:  5 days   Timing:  Intermittent   Progression:  Unchanged Chronicity:  New Relieved by:  None tried Worsened by:  Nothing tried Ineffective treatments:  None tried Associated symptoms: headache, nausea and vomiting   Associated symptoms: no loss of consciousness     HPI Comments: [name] is a 43 y.o. female who presents to the Emergency Department complaining of a head injury that occurred 5 days ago when she reports that she hit her frontal head on a metal box while walking up stairs. She believes that she may have had a concussion. She states that she had a bruise and swelling to her frontal head after the injury, which has resolved. She states that she has not been evaluated until today. She denies any LOC. She states that she has had a mild headache since the injury occurred. She also reports associated dizziness, photophobia and nausea over the past 5 days. She reports associated episodes of emesis 4 days ago, but none since.  A friend in the room reports that pt's speech has seemed slower than usual today. Pt also notes that she feels "sedated". She states that she takes takes Trazadone and Adderall, but no other medications. She states that she cannot remember if she took Xanax today.   Past Medical History  Diagnosis Date   . Anxiety   . Depression   . Disc degeneration   . ADD (attention deficit disorder)   . History of kidney stones   . Chronic fatigue syndrome   . Disturbance of skin sensation   . Unspecified nonpsychotic mental disorder   . Sleep disturbance, unspecified   . Arthropathy, unspecified, site unspecified   . Sleep disorder 01/08/2014   Past Surgical History  Procedure Laterality Date  . Mandible surgery    . Cholecystectomy    . Cesarean section      x2  . Diagnostic laparoscopy    . Robotic assisted total hysterectomy N/A 10/16/2013    Procedure: ROBOTIC ASSISTED TOTAL HYSTERECTOMY;  Surgeon: Lovenia Kim, MD;  Location: [facility];  Service: Gynecology;  Laterality: N/A;  . Bilateral salpingectomy Bilateral 10/16/2013    Procedure: BILATERAL SALPINGECTOMY;  Surgeon: Lovenia Kim, MD;  Location: [facility];  Service: Gynecology;  Laterality: Bilateral;  . Abdominal hysterectomy     Family History  Problem Relation Age of Onset  . Cancer Paternal Grandfather   . Stroke Maternal Grandmother    History  Substance Use Topics  . Smoking status: Never Smoker   . Smokeless tobacco: Not on file  . Alcohol Use: Yes     Comment: socially - had 1 beer tonight   OB History   Grav Para Term Preterm Abortions TAB SAB Ect  Mult Living                 Review of Systems  Eyes: Positive for photophobia.  Gastrointestinal: Positive for nausea and vomiting.  Neurological: Positive for dizziness and headaches. Negative for loss of consciousness and syncope.  All other systems reviewed and are negative.   Allergies  Sulfa antibiotics  Home Medications   Prior to Admission medications   Medication Sig Start Date End Date Taking? Authorizing Provider  ALPRAZolam Duanne Moron) 1 MG tablet Take 1 mg by mouth at bedtime as needed for anxiety or sleep.    Historical Provider, MD  amphetamine-dextroamphetamine (ADDERALL XR) 25 MG 24 hr capsule Take 25 mg by mouth every morning.    Historical  Provider, MD  cyclobenzaprine (FLEXERIL) 10 MG tablet Take 10 mg by mouth 3 (three) times daily as needed.     Historical Provider, MD  diclofenac (VOLTAREN) 75 MG EC tablet Take 75 mg by mouth 2 (two) times daily as needed (inflammation).    Historical Provider, MD  FLUoxetine (PROZAC) 10 MG capsule Take 10 mg by mouth daily.    Historical Provider, MD  gabapentin (NEURONTIN) 100 MG capsule  11/15/13   Historical Provider, MD  oxyCODONE-acetaminophen (PERCOCET/ROXICET) 5-325 MG per tablet Take 1-2 tablets by mouth every 4 (four) hours as needed for severe pain. 10/17/13   Lovenia Kim, MD  temazepam (RESTORIL) 30 MG capsule Take 1 capsule by mouth at bedtime as needed and may repeat dose one time if needed. 11/04/13   Historical Provider, MD  traMADol (ULTRAM) 50 MG tablet Take 1-2 tablets (50-100 mg total) by mouth every 6 (six) hours as needed for moderate pain. 10/17/13   Lovenia Kim, MD  traZODone (DESYREL) 50 MG tablet Take 100 mg by mouth at bedtime.    Historical Provider, MD   Triage Vitals: BP 132/88  Pulse 112  Temp(Src) 97.8 F (36.6 C) (Oral)  Resp 18  Ht 5\' 3"  (1.6 m)  Wt 178 lb (80.74 kg)  BMI 31.54 kg/m2  SpO2 99%  LMP 10/04/2013  Physical Exam  Nursing note and vitals reviewed. Constitutional: She is oriented to person, place, and time. She appears well-developed and well-nourished. No distress.  HENT:  Head: Normocephalic.  No crepitus or step-off.  Eyes: EOM are normal.  Neck: Neck supple. No tracheal deviation present.  Cardiovascular: Normal rate.   Pulmonary/Chest: Effort normal. No respiratory distress.  Musculoskeletal: Normal range of motion.  Neurological: She is alert and oriented to person, place, and time. She displays normal reflexes. No cranial nerve deficit. She exhibits normal muscle tone. Coordination normal.  Strength is 5/5 throughout. Cranial nerves 2-12 intact. Hyper-reflexive equally in bilateral lower legs. Ambulates with normal gait here.   Skin: Skin is warm and dry.  Psychiatric: She has a normal mood and affect. Her behavior is normal.    ED Course  Procedures (including critical care time)  DIAGNOSTIC STUDIES: Oxygen Saturation is 99% on RA, normal by my interpretation.    COORDINATION OF CARE: 10:04 PM- Discussed plan to order a head CT. Pt advised of plan for treatment and pt agrees.  Labs Review Labs Reviewed - No data to display  Imaging Review No results found.   EKG Interpretation None      MDM   Final diagnoses:  Post concussive syndrome    I personally performed the services described in this documentation, which was scribed in my presence. The recorded information has been reviewed and considered.  Tracey Pollack, MD 02/05/14 [phone]

## 2014-02-04 NOTE — Telephone Encounter (Signed)
Spoke with patient and informed her that per Dr. Rexene Alberts, that she should be seen at the emergency room, patient states that she is getting better, she is having headaches, vomiting, and nausea, instructed patient that she should go be seen, patient still states if she is feeling better she will not worry about being checked out.

## 2014-02-04 NOTE — ED Notes (Signed)
Pt raised up from bending over and hit the back of her head on a metal box on 4/16.  Sts she has been having HAs, dizziness and nausea ever since. Vomited x1 4 days ago.

## 2014-02-12 ENCOUNTER — Telehealth: Payer: Self-pay | Admitting: Neurology

## 2014-02-12 NOTE — Telephone Encounter (Signed)
Please call and notify the patient that the recent sleep study did not show any significant obstructive sleep apnea. Please inform patient that I would like to go over the details of the study during a follow up appointment and if not already previously scheduled, arrange a followup appointment (please utilize a followu-up slot). Also, route or fax report to PCP and referring MD, if other than PCP.  Once you have spoken to patient, you can close this encounter.   Thanks,  Kilea Mccarey, MD, PhD [facility] ([facility]  

## 2014-02-14 ENCOUNTER — Encounter: Payer: Self-pay | Admitting: *Deleted

## 2014-02-14 NOTE — Telephone Encounter (Signed)
I called and spoke with the patient about her recent sleep study results. I informed the patient that the study did not show any significant obstructive sleep apnea. I informed the patient that I will fax a copy of the report to Dr. Einar Gip, Dr. Virgina Jock and Dr. Toy Care offices. I will mail a copy of the report to the patient.

## 2014-02-17 ENCOUNTER — Encounter: Payer: Self-pay | Admitting: Neurology

## 2014-02-17 ENCOUNTER — Ambulatory Visit (INDEPENDENT_AMBULATORY_CARE_PROVIDER_SITE_OTHER): Payer: 59 | Admitting: Neurology

## 2014-02-17 VITALS — BP 121/84 | HR 77 | Temp 97.3°F | Ht 63.0 in | Wt 181.0 lb

## 2014-02-17 DIAGNOSIS — G478 Other sleep disorders: Secondary | ICD-10-CM

## 2014-02-17 DIAGNOSIS — R51 Headache: Secondary | ICD-10-CM

## 2014-02-17 DIAGNOSIS — G4700 Insomnia, unspecified: Secondary | ICD-10-CM

## 2014-02-17 NOTE — Patient Instructions (Signed)
Please remember, common headache triggers are: sleep deprivation, dehydration, overheating, stress, hypoglycemia or skipping meals and blood sugar fluctuations, excessive pain medications or excessive alcohol use or caffeine withdrawal. Some people have food triggers such as aged cheese, orange juice or chocolate, especially dark chocolate, or MSG (monosodium glutamate). Try to avoid these headache triggers as much possible. It may be helpful to keep a headache diary to figure out what makes your headaches worse or brings them on and what alleviates them. Some people report headache onset after exercise but studies have shown that regular exercise may actually prevent headaches from coming. If you have exercise-induced headaches, please make sure that you drink plenty of fluid before and after exercising and that you do not over do it and do not overheat.  Please remember to try to maintain good sleep hygiene, which means: Keep a regular sleep and wake schedule, try not to exercise or have a meal within 2 hours of your bedtime, try to keep your bedroom conducive for sleep, that is, cool and dark, without light distractors such as an illuminated alarm clock, and refrain from watching TV right before sleep or in the middle of the night and do not keep the TV or radio on during the night. Also, try not to use or play on electronic devices at bedtime, such as your cell phone, tablet PC or laptop. If you like to read at bedtime on an electronic device, try to dim the background light as much as possible. Do not eat in the middle of the night.   Please also avoid caffeine after 4 PM to help with your insomnia.

## 2014-02-17 NOTE — Progress Notes (Signed)
Subjective:    Patient ID: [name] is a 43 y.o. female.  HPI     Dear Dr. Virgina Jock,   I saw your patient, [name], upon your kind request in my neurologic clinic today for initial consultation of her headache and diagnosis of concussion. The patient is unaccompanied today. As you know, [name] is a 43 year old right-handed woman with an underlying medical history of ADD, depression, anxiety, chronic fatigue syndrome, who I first met on 01/08/2014 at the request of her cardiologist, Dr. Einar Gip, as she previously had an abnormal pulse oximetry test in February. The patient today presents after she was seen in the emergency room on 02/04/2014 after she reported hitting her for head against a metal box while walking upstairs about 5 days prior. She denied any loss of consciousness. She had a bruise to her forehead and some swelling. This resolved. She had a head CT without contrast in the emergency room on 02/04/2014, which was reported as unremarkable. In addition, I personally reviewed the images through the PACS system. She also had a sleep study which I ordered after I first met her in March.I reviewed her emergency room records and also explained her sleep study findings to her in detail today. We also called her with the test results recently. She had a baseline sleep study on 01/27/2014. Sleep efficiency was markedly reduced at 52.6% with a latency to sleep of 162.5 minutes and wake after sleep onset of 53 minutes with moderate sleep fragmentation noted. She was noted to have a cup of sweet tea upon arrival, but she states, she reports that she was not drinking it, and that it was left over from her dinner. She took Xanax and trazodone prior to the sleep study. The arousal index was normal. She had markedly increased percentage of stage II sleep, absence of deep sleep and a markedly reduced percentage of dream sleep with a prolonged REM latency. She had no significant periodic leg movements  of sleep and no significant EKG changes. She had mild intermittent snoring. She slept mostly on the sides. She had a total AHI of 0.8 per hour. Baseline oxygen saturation was 95%, nadir was 91%.  Today, she reports having some shooting pains into the posterior neck and occasional tingling in her hands, none which is new, and has been happening for years. She has occasional HAs with nausea, and photophobia. She has not tried OTC medications consistently, but has on occasion tried Tylenol. She sees an orthopedics for mid and low back pain and is in PT. She sees Dr. Toy Care. She had cardiac workup for chest pains. She still has some intermittent shooting chest pains and also some groin pain. She complains of fatigue. She has not yet received a copy of her sleep study. She is also requesting a copy of her ER visit notes.   Her Past Medical History Is Significant For: Past Medical History  Diagnosis Date  . Anxiety   . Depression   . Disc degeneration   . ADD (attention deficit disorder)   . History of kidney stones   . Chronic fatigue syndrome   . Disturbance of skin sensation   . Unspecified nonpsychotic mental disorder   . Sleep disturbance, unspecified   . Arthropathy, unspecified, site unspecified   . Sleep disorder 01/08/2014    Her Past Surgical History Is Significant For: Past Surgical History  Procedure Laterality Date  . Mandible surgery    . Cholecystectomy    . Cesarean section  x2  . Diagnostic laparoscopy    . Robotic assisted total hysterectomy N/A 10/16/2013    Procedure: ROBOTIC ASSISTED TOTAL HYSTERECTOMY;  Surgeon: Lovenia Kim, MD;  Location: [facility];  Service: Gynecology;  Laterality: N/A;  . Bilateral salpingectomy Bilateral 10/16/2013    Procedure: BILATERAL SALPINGECTOMY;  Surgeon: Lovenia Kim, MD;  Location: [facility];  Service: Gynecology;  Laterality: Bilateral;  . Abdominal hysterectomy      Her Family History Is Significant For: Family History   Problem Relation Age of Onset  . Cancer Paternal Grandfather   . Stroke Maternal Grandmother     Her Social History Is Significant For: History   Social History  . Marital Status: Married    Spouse Name: N/A    Number of Children: N/A  . Years of Education: N/A   Social History Main Topics  . Smoking status: Never Smoker   . Smokeless tobacco: None  . Alcohol Use: Yes     Comment: socially - had 1 beer tonight  . Drug Use: No  . Sexual Activity: Yes    Birth Control/ Protection: None, Surgical   Other Topics Concern  . None   Social History Narrative  . None    Her Allergies Are:  Allergies  Allergen Reactions  . Sulfa Antibiotics Hives    All over body  :   Her Current Medications Are:  Outpatient Encounter Prescriptions as of 02/17/2014  Medication Sig  . diclofenac (VOLTAREN) 75 MG EC tablet Take 75 mg by mouth 2 (two) times daily as needed (inflammation).  Marland Kitchen FLUoxetine (PROZAC) 10 MG capsule Take 10 mg by mouth daily.  . traZODone (DESYREL) 50 MG tablet Take 100 mg by mouth at bedtime.  . ALPRAZolam (XANAX) 1 MG tablet Take 1 mg by mouth at bedtime as needed for anxiety or sleep.  Marland Kitchen amphetamine-dextroamphetamine (ADDERALL XR) 25 MG 24 hr capsule Take 25 mg by mouth every morning.  . cyclobenzaprine (FLEXERIL) 10 MG tablet Take 10 mg by mouth 3 (three) times daily as needed.   . gabapentin (NEURONTIN) 100 MG capsule   . oxyCODONE-acetaminophen (PERCOCET/ROXICET) 5-325 MG per tablet Take 1-2 tablets by mouth every 4 (four) hours as needed for severe pain.  Marland Kitchen temazepam (RESTORIL) 30 MG capsule Take 1 capsule by mouth at bedtime as needed and may repeat dose one time if needed.  . traMADol (ULTRAM) 50 MG tablet Take 1-2 tablets (50-100 mg total) by mouth every 6 (six) hours as needed for moderate pain.  :  Review of Systems:  Out of a complete 14 point review of systems, all are reviewed and negative with the exception of these symptoms as listed below:  Review  of Systems  Constitutional: Positive for fatigue.  Eyes: Positive for visual disturbance (blurred vision).  Respiratory: Positive for shortness of breath.   Cardiovascular: Positive for chest pain.  Gastrointestinal: Positive for diarrhea and constipation.  Endocrine: Negative.   Genitourinary: Negative.   Musculoskeletal: Positive for arthralgias, joint swelling and myalgias.  Skin: Negative.   Allergic/Immunologic: Positive for environmental allergies.  Neurological: Positive for dizziness, speech difficulty and headaches.  Hematological: Negative.   Psychiatric/Behavioral: Positive for confusion and decreased concentration. The patient is nervous/anxious.        Too much sleep    Objective:  Neurologic Exam  Physical Exam Physical Examination:   Filed Vitals:   02/17/14 0823  BP: 121/84  Pulse: 77  Temp: 97.3 F (36.3 C)    General Examination: The patient  is a very pleasant 43 y.o. female in no acute distress. She appears well-developed and well-nourished and well groomed.   HEENT: Normocephalic, atraumatic, pupils are equal, round and reactive to light and accommodation. Funduscopic exam is normal with sharp disc margins noted. Extraocular tracking is good without limitation to gaze excursion or nystagmus noted. Normal smooth pursuit is noted. Hearing is grossly intact. Tympanic membranes are clear bilaterally. Face is symmetric with normal facial animation and normal facial sensation. Speech is clear with no dysarthria noted. There is no hypophonia. There is no lip, neck/head, jaw or voice tremor. Neck is supple with full range of passive and active motion. There are no carotid bruits on auscultation. Oropharynx exam reveals: mild mouth dryness, good dental hygiene and mild airway crowding, due to larger tongue, narrow airway entry and tonsils. Mallampati is class I. Tongue protrudes centrally and palate elevates symmetrically. Tonsils are 1+ in size.  Chest: Clear to  auscultation without wheezing, rhonchi or crackles noted.  Heart: S1+S2+0, regular and normal without murmurs, rubs or gallops noted.   Abdomen: Soft, non-tender and non-distended with normal bowel sounds appreciated on auscultation.  Extremities: There is no pitting edema in the distal lower extremities bilaterally. Pedal pulses are intact.  Skin: Warm and dry without trophic changes noted. There are no varicose veins.  Musculoskeletal: exam reveals no obvious joint deformities, tenderness or joint swelling or erythema.   Neurologically:  Mental status: The patient is awake, alert and oriented in all 4 spheres. Her immediate and remote memory, attention, language skills and fund of knowledge are appropriate. There is no evidence of aphasia, agnosia, apraxia or anomia. Speech is clear with normal prosody and enunciation. Thought process is linear. Mood is anxious and affect is normal.  Cranial nerves II - XII are as described above under HEENT exam. In addition: shoulder shrug is normal with equal shoulder height noted. Motor exam: Normal bulk, strength and tone is noted. There is no drift, tremor or rebound. Romberg is negative. Reflexes are 2+ throughout. Babinski: Toes are flexor bilaterally. Fine motor skills and coordination: intact with normal finger taps, normal hand movements, normal rapid alternating patting, normal foot taps and normal foot agility.  Cerebellar testing: No dysmetria or intention tremor on finger to nose testing. Heel to shin is unremarkable bilaterally.  Sensory exam: intact to light touch, pinprick, vibration, temperature sense in the upper and lower extremities.  Gait, station and balance: She stands easily. No veering to one side is noted. No leaning to one side is noted. Posture is age-appropriate and stance is narrow based. Gait shows normal stride length and normal pace. No problems turning are noted. She turns en bloc. Tandem walk is unremarkable. Intact toe and  heel stance is noted.                          Assessment and Plan:   In summary, LURETTA EVERLY is a very pleasant 42 y.o.-year old female with an underlying medical history of ADD, depression, anxiety, chronic fatigue syndrome, who presents after her sleep study and a new problem after hitting her head. She has a benign exam and is feeling better. I am not sure how to tie in her groin pain and her shooting chest pains for which she has been checked out by Dr. Einar Gip in cardiology. As far as her sleep study, I explained the findings in detail to her and also provided her with a copy of her sleep  study results and as per her request a copy of her ER notes from her recent ER visit due to hitting her head. She is reassured that her CT scan was unremarkable and her exam is nonfocal. She is encouraged to try over-the-counter Tylenol or Motrin as needed for her headache. She is encouraged to followup with her orthopedic doctor for neck pain. She may need a neck MRI but I encouraged her to discuss this with her orthopedic surgeon. From my end of things she is nonfocal on exam and her sleep study was benign, in that, she does not have any evidence of sleep disordered breathing but does have evidence of problems initiating and maintaining sleep which has been a chronic issue. She sees Dr. Toy Care for her mood disorder and chronic insomnia issues. I did reiterate good sleep hygiene to her and encouraged her to reduce her caffeine intake and avoid caffeine after 5 PM. At this juncture I can see her back on an as-needed basis. She was in agreement. Thank you very much for allowing me to participate in the care of this nice patient. If I can be of any further assistance to you please do not hesitate to call me at [phone].  Sincerely,   Star Age, MD, PhD

## 2015-05-05 ENCOUNTER — Emergency Department (HOSPITAL_COMMUNITY): Payer: 59

## 2015-05-05 ENCOUNTER — Encounter (HOSPITAL_COMMUNITY): Payer: Self-pay

## 2015-05-05 ENCOUNTER — Emergency Department (HOSPITAL_COMMUNITY)
Admission: EM | Admit: 2015-05-05 | Discharge: 2015-05-05 | Disposition: A | Discharge status: Home / Self Care | Payer: 59 | Attending: Emergency Medicine | Admitting: Emergency Medicine

## 2015-05-05 DIAGNOSIS — Z8669 Personal history of other diseases of the nervous system and sense organs: Secondary | ICD-10-CM | POA: Insufficient documentation

## 2015-05-05 DIAGNOSIS — L255 Unspecified contact dermatitis due to plants, except food: Secondary | ICD-10-CM | POA: Diagnosis not present

## 2015-05-05 DIAGNOSIS — Z87442 Personal history of urinary calculi: Secondary | ICD-10-CM | POA: Insufficient documentation

## 2015-05-05 DIAGNOSIS — M797 Fibromyalgia: Secondary | ICD-10-CM | POA: Insufficient documentation

## 2015-05-05 DIAGNOSIS — R1031 Right lower quadrant pain: Secondary | ICD-10-CM | POA: Diagnosis not present

## 2015-05-05 DIAGNOSIS — F329 Major depressive disorder, single episode, unspecified: Secondary | ICD-10-CM | POA: Diagnosis not present

## 2015-05-05 DIAGNOSIS — F419 Anxiety disorder, unspecified: Secondary | ICD-10-CM | POA: Diagnosis not present

## 2015-05-05 DIAGNOSIS — R079 Chest pain, unspecified: Secondary | ICD-10-CM | POA: Diagnosis present

## 2015-05-05 DIAGNOSIS — F909 Attention-deficit hyperactivity disorder, unspecified type: Secondary | ICD-10-CM | POA: Diagnosis not present

## 2015-05-05 DIAGNOSIS — R52 Pain, unspecified: Secondary | ICD-10-CM

## 2015-05-05 DIAGNOSIS — R0789 Other chest pain: Secondary | ICD-10-CM | POA: Diagnosis not present

## 2015-05-05 DIAGNOSIS — M62838 Other muscle spasm: Secondary | ICD-10-CM | POA: Diagnosis not present

## 2015-05-05 DIAGNOSIS — Z79899 Other long term (current) drug therapy: Secondary | ICD-10-CM | POA: Diagnosis not present

## 2015-05-05 DIAGNOSIS — R110 Nausea: Secondary | ICD-10-CM | POA: Insufficient documentation

## 2015-05-05 DIAGNOSIS — R1032 Left lower quadrant pain: Secondary | ICD-10-CM | POA: Diagnosis not present

## 2015-05-05 DIAGNOSIS — L237 Allergic contact dermatitis due to plants, except food: Secondary | ICD-10-CM

## 2015-05-05 HISTORY — DX: Fibromyalgia: M79.7

## 2015-05-05 LAB — COMPREHENSIVE METABOLIC PANEL
ALK PHOS: 116 U/L (ref 38–126)
ALT: 17 U/L (ref 14–54)
AST: 17 U/L (ref 15–41)
Albumin: 3.7 g/dL (ref 3.5–5.0)
Anion gap: 5 (ref 5–15)
BILIRUBIN TOTAL: 0.6 mg/dL (ref 0.3–1.2)
BUN: 11 mg/dL (ref 6–20)
CALCIUM: 8.9 mg/dL (ref 8.9–10.3)
CO2: 28 mmol/L (ref 22–32)
CREATININE: 0.9 mg/dL (ref 0.44–1.00)
Chloride: 106 mmol/L (ref 101–111)
GFR calc Af Amer: >60 mL/min (ref >60)
GFR calc non Af Amer: >60 mL/min (ref >60)
Glucose, Bld: 92 mg/dL (ref 65–99)
POTASSIUM: 4 mmol/L (ref 3.5–5.1)
SODIUM: 139 mmol/L (ref 135–145)
Total Protein: 6.9 g/dL (ref 6.5–8.1)

## 2015-05-05 LAB — CBC WITH DIFFERENTIAL/PLATELET
BASOS PCT: 1 % (ref 0–1)
Basophils Absolute: 0.1 10*3/uL (ref 0.0–0.1)
Eosinophils Absolute: 0.1 10*3/uL (ref 0.0–0.7)
Eosinophils Relative: 1 % (ref 0–5)
HCT: 40.2 % (ref 36.0–46.0)
Hemoglobin: 13.3 g/dL (ref 12.0–15.0)
Lymphocytes Relative: 26 % (ref 12–46)
Lymphs Abs: 2.1 10*3/uL (ref 0.7–4.0)
MCH: 28.7 pg (ref 26.0–34.0)
MCHC: 33.1 g/dL (ref 30.0–36.0)
MCV: 86.8 fL (ref 78.0–100.0)
Monocytes Absolute: 0.5 10*3/uL (ref 0.1–1.0)
Monocytes Relative: 6 % (ref 3–12)
Neutro Abs: 5.6 10*3/uL (ref 1.7–7.7)
Neutrophils Relative %: 66 % (ref 43–77)
PLATELETS: 248 10*3/uL (ref 150–400)
RBC: 4.63 MIL/uL (ref 3.87–5.11)
RDW: 12.6 % (ref 11.5–15.5)
WBC: 8.3 10*3/uL (ref 4.0–10.5)

## 2015-05-05 LAB — URINALYSIS, ROUTINE W REFLEX MICROSCOPIC
Bilirubin Urine: NEGATIVE
GLUCOSE, UA: NEGATIVE mg/dL
Hgb urine dipstick: NEGATIVE
Ketones, ur: NEGATIVE mg/dL
Leukocytes, UA: NEGATIVE
NITRITE: NEGATIVE
PH: 7.5 (ref 5.0–8.0)
PROTEIN: NEGATIVE mg/dL
SPECIFIC GRAVITY, URINE: 1.012 (ref 1.005–1.030)
UROBILINOGEN UA: 0.2 mg/dL (ref 0.0–1.0)

## 2015-05-05 LAB — I-STAT TROPONIN, ED: Troponin i, poc: 0 ng/mL (ref 0.00–0.08)

## 2015-05-05 LAB — LIPASE, BLOOD: Lipase: 15 U/L — ABNORMAL LOW (ref 22–51)

## 2015-05-05 MED ORDER — PREDNISONE 20 MG PO TABS
60.0000 mg | ORAL_TABLET | Freq: Once | ORAL | Status: AC
  Administered 2015-05-05: 60 mg via ORAL
  Filled 2015-05-05: qty 3

## 2015-05-05 MED ORDER — PREDNISONE 20 MG PO TABS: 60.0000 mg | ORAL_TABLET | Freq: Every day | ORAL | Status: DC

## 2015-05-05 MED ORDER — CYCLOBENZAPRINE HCL 10 MG PO TABS
10.0000 mg | ORAL_TABLET | Freq: Once | ORAL | Status: AC
  Administered 2015-05-05: 10 mg via ORAL
  Filled 2015-05-05: qty 1

## 2015-05-05 NOTE — ED Provider Notes (Signed)
CSN: [id number]     Arrival date & time 05/05/15  0940 History   First MD Initiated Contact with Patient 05/05/15 1001     Chief Complaint  Patient presents with  . Groin Pain  . Spasms     (Consider location/radiation/quality/duration/timing/severity/associated sxs/prior Treatment) HPI  Blood pressure 130/93, pulse 83, temperature 97.9 F (36.6 C), temperature source Oral, resp. rate 16, last menstrual period 10/04/2013, SpO2 98 %.  [name] is a 44 y.o. female complaining of bilateral groin pain, generalized muscle spasms chest tightness poison oak to bilateral forearms which she noticed yesterday and dry mouth onset 1 day with nausea, pain is 10 out of 10, patient saw Dr. Para March for steroid-dependent and pain shots 6 days ago. Her psychiatrist Dr. Robina Ade increased her Lyrica dosage 3 days ago when she thinks that she is having an allergic reaction. She also states that she started amitriptyline 3 days ago as well. Patient denies fever, chills, nausea, vomiting she states she hasn't taken any medication in one day.   Past Medical History  Diagnosis Date  . Anxiety   . Depression   . Disc degeneration   . ADD (attention deficit disorder)   . History of kidney stones   . Chronic fatigue syndrome   . Disturbance of skin sensation   . Unspecified nonpsychotic mental disorder   . Sleep disturbance, unspecified   . Arthropathy, unspecified, site unspecified   . Sleep disorder 01/08/2014  . Fibromyalgia    Past Surgical History  Procedure Laterality Date  . Mandible surgery    . Cholecystectomy    . Cesarean section      x2  . Diagnostic laparoscopy    . Robotic assisted total hysterectomy N/A 10/16/2013    Procedure: ROBOTIC ASSISTED TOTAL HYSTERECTOMY;  Surgeon: Lovenia Kim, MD;  Location: [facility];  Service: Gynecology;  Laterality: N/A;  . Bilateral salpingectomy Bilateral 10/16/2013    Procedure: BILATERAL SALPINGECTOMY;  Surgeon: Lovenia Kim, MD;  Location: [facility];  Service: Gynecology;  Laterality: Bilateral;  . Abdominal hysterectomy     Family History  Problem Relation Age of Onset  . Cancer Paternal Grandfather   . Stroke Maternal Grandmother    History  Substance Use Topics  . Smoking status: Never Smoker   . Smokeless tobacco: Not on file  . Alcohol Use: Yes     Comment: socially    OB History    No data available     Review of Systems  10 systems reviewed and found to be negative, except as noted in the HPI.   Allergies  Sulfa antibiotics  Home Medications   Prior to Admission medications   Medication Sig Start Date End Date Taking? Authorizing Provider  ADDERALL XR 30 MG 24 hr capsule 60 mg daily. Take two capsules by mouth daily 04/30/15  Yes Historical Provider, MD  ALPRAZolam Duanne Moron) 1 MG tablet Take 1 mg by mouth at bedtime as needed for anxiety or sleep.   Yes Historical Provider, MD  amitriptyline (ELAVIL) 25 MG tablet Take 25 mg by mouth at bedtime. 04/25/15  Yes Historical Provider, MD  cyclobenzaprine (FLEXERIL) 10 MG tablet Take 10 mg by mouth 3 (three) times daily as needed.    Yes Historical Provider, MD  diclofenac (VOLTAREN) 75 MG EC tablet Take 75 mg by mouth 2 (two) times daily as needed (inflammation).   Yes Historical Provider, MD  FLUoxetine (PROZAC) 10 MG capsule Take 10 mg by mouth daily.  Yes Historical Provider, MD  LYRICA 150 MG capsule Take 150 mg by mouth 2 (two) times daily. 04/25/15  Yes Historical Provider, MD  phentermine 37.5 MG capsule Take 37.5 mg by mouth every morning.   Yes Historical Provider, MD  PRESCRIPTION MEDICATION Analgesic injection into Right hip   Yes Historical Provider, MD  PRESCRIPTION MEDICATION Steroid injection into Left hip   Yes Historical Provider, MD  temazepam (RESTORIL) 30 MG capsule Take 1 capsule by mouth at bedtime as needed and may repeat dose one time if needed. 11/04/13  Yes Historical Provider, MD  traZODone (DESYREL) 50 MG tablet Take 100 mg by mouth at bedtime.    Yes Historical Provider, MD  oxyCODONE-acetaminophen (PERCOCET/ROXICET) 5-325 MG per tablet Take 1-2 tablets by mouth every 4 (four) hours as needed for severe pain. Patient not taking: Reported on 05/05/2015 10/17/13   Brien Few, MD  predniSONE (DELTASONE) 20 MG tablet Take 3 tablets (60 mg total) by mouth daily. Take 60 mg by mouth daily week 1, then 40mg  by mouth daily for week 2, then 20mg  daily for week 3 05/05/15   Elmyra Ricks Alyssabeth Bruster, PA-C  tiZANidine (ZANAFLEX) 4 MG tablet  04/29/15   Historical Provider, MD  traMADol (ULTRAM) 50 MG tablet Take 1-2 tablets (50-100 mg total) by mouth every 6 (six) hours as needed for moderate pain. Patient not taking: Reported on 05/05/2015 10/17/13   Brien Few, MD   BP 146/93 mmHg  Pulse 65  Temp(Src) 97.9 F (36.6 C) (Oral)  Resp 18  SpO2 98%  LMP 10/04/2013 Physical Exam  Constitutional: She is oriented to person, place, and time. She appears well-developed and well-nourished. No distress.  HENT:  Head: Normocephalic and atraumatic.  Mouth/Throat: Oropharynx is clear and moist.  Eyes: Conjunctivae and EOM are normal. Pupils are equal, round, and reactive to light.  Cardiovascular: Normal rate, regular rhythm and intact distal pulses.   Pulmonary/Chest: Effort normal and breath sounds normal. No stridor. No respiratory distress. She has no wheezes. She has no rales. She exhibits no tenderness.  Abdominal: Soft. Bowel sounds are normal. She exhibits no distension and no mass. There is no tenderness. There is no rebound and no guarding.  Musculoskeletal: Normal range of motion.  Neurological: She is alert and oriented to person, place, and time.  Skin: Rash noted.  Secure rash to bilateral forearms with no surrounding erythema, lesions are blanchable, no warmth or discharge. Lesions spare the palms soles or mucous membranes.  Psychiatric: She has a normal mood and affect.  Nursing note and vitals reviewed.   ED Course  Procedures (including  critical care time) Labs Review Labs Reviewed  LIPASE, BLOOD - Abnormal; Notable for the following:    Lipase 15 (*)    All other components within normal limits  CBC WITH DIFFERENTIAL/PLATELET  COMPREHENSIVE METABOLIC PANEL  URINALYSIS, ROUTINE W REFLEX MICROSCOPIC (NOT AT [facility])  Randolm Idol, ED    Imaging Review Dg Chest 2 View  05/05/2015   CLINICAL DATA:  Chest pain.  EXAM: CHEST  2 VIEW  COMPARISON:  None.  FINDINGS: The heart size and mediastinal contours are within normal limits. Both lungs are clear. The visualized skeletal structures are unremarkable.  IMPRESSION: Normal exam.   Electronically Signed   By: Lorriane Shire M.D.   On: 05/05/2015 10:59     EKG Interpretation None      MDM   Final diagnoses:  Pain  Contact dermatitis due to poison vine  Atypical chest pain  Filed Vitals:   05/05/15 0957 05/05/15 1101  BP: 130/93 146/93  Pulse: 83 65  Temp: 97.9 F (36.6 C)   TempSrc: Oral   Resp: 16 18  SpO2: 98% 98%    Medications  predniSONE (DELTASONE) tablet 60 mg (not administered)  cyclobenzaprine (FLEXERIL) tablet 10 mg (10 mg Oral Given 05/05/15 1038)    [name] is a pleasant 44 y.o. female presenting with multiple complaints, thinks that it may be related to her medication change, she just started amitriptyline and she increase her dosage of Lyrica. She is reporting some vague chest pain which sounds atypical her EKG is nonischemic with no arrhythmia, blood work with no abnormality and chest x-ray is also negative. Patient also has poison ivy, will treat with prednisone and advised patient on rash care. I've advised her to follow with Dr. Robina Ade for management of the amitriptyline and Lyrica. Out of an abundance of caution she can DC the amitriptyline and even Lyrica she would like until she follows with her psychiatrist.  Evaluation does not show pathology that would require ongoing emergent intervention or inpatient treatment. Pt is  hemodynamically stable and mentating appropriately. Discussed findings and plan with patient/guardian, who agrees with care plan. All questions answered. Return precautions discussed and outpatient follow up given.   New Prescriptions   PREDNISONE (DELTASONE) 20 MG TABLET    Take 3 tablets (60 mg total) by mouth daily. Take 60 mg by mouth daily week 1, then 40mg  by mouth daily for week 2, then 20mg  daily for week [place], PA-C 05/05/15 [address] PA-C 05/05/15 1225  Elnora Morrison, MD 05/10/15 [phone]

## 2015-05-05 NOTE — ED Notes (Signed)
Pt c/o bilateral groin pain, generalized muscle spasms, poison oak/ivy on bilateral forearms, and "dry mouth" x 1 day and nausea and central, constant, chest tightness starting this morning.  Pain score 10/10.  Pt reports having "steroid and pain shots" in buttocks x 6 days ago, increased Lyrica dosage x 3 days ago, and another new medication x 2 days ago.  Hx of fibromyalgia.

## 2015-05-05 NOTE — ED Notes (Signed)
Made first request for a urine sample 

## 2015-05-05 NOTE — ED Notes (Signed)
Questions r/t dc were denied. Pt ambulatory and a&ox4 

## 2015-05-05 NOTE — Discharge Instructions (Signed)
Please follow with your primary care doctor in the next 2 days for a check-up. They must obtain records for further management.   Do not hesitate to return to the Emergency Department for any new, worsening or concerning symptoms.   If you see signs of infection (warmth, redness, tenderness, pus, sharp increase in pain, fever) return for revaluation immediately.  1. Do Not use topical steroids (hydrocortisone Cream) on the face 2. Continue to take benadryl orally for itch relief.  3. Calamine lotion may also be helpful  4. Take the prednisone tablets earlier in the day to prevent sleep disturbance. Abruptly stopping prednisone before the full course is complete may cause a rebound reaction with increase in rash and itching.   Poison [employer] ivy is a inflammation of the skin (contact dermatitis) caused by touching the allergens on the leaves of the ivy plant following previous exposure to the plant. The rash usually appears 48 hours after exposure. The rash is usually bumps (papules) or blisters (vesicles) in a linear pattern. Depending on your own sensitivity, the rash may simply cause redness and itching, or it may also progress to blisters which may break open. These must be well cared for to prevent secondary bacterial (germ) infection, followed by scarring. Keep any open areas dry, clean, dressed, and covered with an antibacterial ointment if needed. The eyes may also get puffy. The puffiness is worst in the morning and gets better as the day progresses. This dermatitis usually heals without scarring, within 2 to 3 weeks without treatment. HOME CARE INSTRUCTIONS  Thoroughly wash with soap and water as soon as you have been exposed to poison ivy. You have about one half hour to remove the plant resin before it will cause the rash. This washing will destroy the oil or antigen on the skin that is causing, or will cause, the rash. Be sure to wash under your fingernails as any plant resin there will  continue to spread the rash. Do not rub skin vigorously when washing affected area. Poison ivy cannot spread if no oil from the plant remains on your body. A rash that has progressed to weeping sores will not spread the rash unless you have not washed thoroughly. It is also important to wash any clothes you have been wearing as these may carry active allergens. The rash will return if you wear the unwashed clothing, even several days later. Avoidance of the plant in the future is the best measure. Poison ivy plant can be recognized by the number of leaves. Generally, poison ivy has three leaves with flowering branches on a single stem. Diphenhydramine may be purchased over the counter and used as needed for itching. Do not drive with this medication if it makes you drowsy.Ask your caregiver about medication for children. SEEK MEDICAL CARE IF:  Open sores develop.  Redness spreads beyond area of rash.  You notice purulent (pus-like) discharge.  You have increased pain.  Other signs of infection develop (such as fever). Document Released: 09/30/2000 Document Revised: 12/26/2011 Document Reviewed: 03/13/2009 [facility] Patient Information 2015 [place], [place]. This information is not intended to replace advice given to you by your health care provider. Make sure you discuss any questions you have with your health care provider.

## 2016-01-11 ENCOUNTER — Emergency Department (HOSPITAL_COMMUNITY)
Admission: EM | Admit: 2016-01-11 | Discharge: 2016-01-11 | Disposition: A | Discharge status: Home / Self Care | Payer: 59 | Attending: Emergency Medicine | Admitting: Emergency Medicine

## 2016-01-11 ENCOUNTER — Emergency Department (HOSPITAL_COMMUNITY): Payer: 59

## 2016-01-11 ENCOUNTER — Encounter (HOSPITAL_COMMUNITY): Payer: Self-pay | Admitting: *Deleted

## 2016-01-11 DIAGNOSIS — F329 Major depressive disorder, single episode, unspecified: Secondary | ICD-10-CM | POA: Insufficient documentation

## 2016-01-11 DIAGNOSIS — R1011 Right upper quadrant pain: Secondary | ICD-10-CM | POA: Diagnosis present

## 2016-01-11 DIAGNOSIS — Z8669 Personal history of other diseases of the nervous system and sense organs: Secondary | ICD-10-CM | POA: Diagnosis not present

## 2016-01-11 DIAGNOSIS — R197 Diarrhea, unspecified: Secondary | ICD-10-CM | POA: Diagnosis not present

## 2016-01-11 DIAGNOSIS — F909 Attention-deficit hyperactivity disorder, unspecified type: Secondary | ICD-10-CM | POA: Diagnosis not present

## 2016-01-11 DIAGNOSIS — F419 Anxiety disorder, unspecified: Secondary | ICD-10-CM | POA: Diagnosis not present

## 2016-01-11 DIAGNOSIS — M797 Fibromyalgia: Secondary | ICD-10-CM | POA: Insufficient documentation

## 2016-01-11 DIAGNOSIS — R05 Cough: Secondary | ICD-10-CM | POA: Diagnosis not present

## 2016-01-11 DIAGNOSIS — Z8739 Personal history of other diseases of the musculoskeletal system and connective tissue: Secondary | ICD-10-CM | POA: Diagnosis not present

## 2016-01-11 DIAGNOSIS — Z79899 Other long term (current) drug therapy: Secondary | ICD-10-CM | POA: Diagnosis not present

## 2016-01-11 DIAGNOSIS — N201 Calculus of ureter: Secondary | ICD-10-CM | POA: Diagnosis not present

## 2016-01-11 LAB — URINE MICROSCOPIC-ADD ON

## 2016-01-11 LAB — COMPREHENSIVE METABOLIC PANEL
ALT: 23 U/L (ref 14–54)
AST: 23 U/L (ref 15–41)
Albumin: 3.7 g/dL (ref 3.5–5.0)
Alkaline Phosphatase: 96 U/L (ref 38–126)
Anion gap: 11 (ref 5–15)
BILIRUBIN TOTAL: 0.5 mg/dL (ref 0.3–1.2)
BUN: 14 mg/dL (ref 6–20)
CHLORIDE: 106 mmol/L (ref 101–111)
CO2: 26 mmol/L (ref 22–32)
CREATININE: 1.21 mg/dL — AB (ref 0.44–1.00)
Calcium: 9.6 mg/dL (ref 8.9–10.3)
GFR, EST NON AFRICAN AMERICAN: 53 mL/min — AB (ref >60)
GLUCOSE: 126 mg/dL — AB (ref 65–99)
Potassium: 3.8 mmol/L (ref 3.5–5.1)
Sodium: 143 mmol/L (ref 135–145)
Total Protein: 6.6 g/dL (ref 6.5–8.1)

## 2016-01-11 LAB — URINALYSIS, ROUTINE W REFLEX MICROSCOPIC
Bilirubin Urine: NEGATIVE
Glucose, UA: NEGATIVE mg/dL
KETONES UR: NEGATIVE mg/dL
Nitrite: NEGATIVE
PROTEIN: NEGATIVE mg/dL
Specific Gravity, Urine: 1.027 (ref 1.005–1.030)
pH: 5.5 (ref 5.0–8.0)

## 2016-01-11 LAB — CBC
HEMATOCRIT: 40.5 % (ref 36.0–46.0)
Hemoglobin: 13.7 g/dL (ref 12.0–15.0)
MCH: 30.4 pg (ref 26.0–34.0)
MCHC: 33.8 g/dL (ref 30.0–36.0)
MCV: 90 fL (ref 78.0–100.0)
PLATELETS: 233 10*3/uL (ref 150–400)
RBC: 4.5 MIL/uL (ref 3.87–5.11)
RDW: 12.9 % (ref 11.5–15.5)
WBC: 6.3 10*3/uL (ref 4.0–10.5)

## 2016-01-11 LAB — LIPASE, BLOOD: LIPASE: 25 U/L (ref 11–51)

## 2016-01-11 MED ORDER — SODIUM CHLORIDE 0.9 % IV BOLUS (SEPSIS): 1000.0000 mL | Freq: Once | INTRAVENOUS | Status: DC

## 2016-01-11 MED ORDER — MORPHINE SULFATE (PF) 4 MG/ML IV SOLN
4.0000 mg | Freq: Once | INTRAVENOUS | Status: DC
  Filled 2016-01-11: qty 1

## 2016-01-11 MED ORDER — OXYCODONE-ACETAMINOPHEN 5-325 MG PO TABS: 1.0000 | ORAL_TABLET | Freq: Once | ORAL | Status: DC

## 2016-01-11 NOTE — ED Provider Notes (Signed)
CSN: [id number]     Arrival date & time 01/11/16  1013 History   First MD Initiated Contact with Patient 01/11/16 1142     Chief Complaint  Patient presents with  . Abdominal Pain     (Consider location/radiation/quality/duration/timing/severity/associated sxs/prior Treatment) Patient is a 45 y.o. female presenting with abdominal pain. The history is provided by the patient.  Abdominal Pain Associated symptoms: chills, cough and diarrhea   Associated symptoms: no chest pain, no fever, no hematemesis, no hematochezia, no hematuria, no melena, no nausea, no shortness of breath, no vaginal bleeding, no vaginal discharge and no vomiting     LAYTON SOZIO is a 45 y.o. female with PMH significant for anxiety, depression, ADD, nephrolithiasis, cholecystectomy, fibromyalgia who presents with sudden onset, sharp, intermittent right flank and RUQ abdominal pain that began approximately 8:30 AM.  She states that it began to "ease up" about 10 AM, and now is a dull pain.  No modifying factors.  No meds PTA.  Denies N/V, bloody stools, SOB, CP, or urinary symptoms.   Past Medical History  Diagnosis Date  . Anxiety   . Depression   . Disc degeneration   . ADD (attention deficit disorder)   . History of kidney stones   . Chronic fatigue syndrome   . Disturbance of skin sensation   . Unspecified nonpsychotic mental disorder   . Sleep disturbance, unspecified   . Arthropathy, unspecified, site unspecified   . Sleep disorder 01/08/2014  . Fibromyalgia    Past Surgical History  Procedure Laterality Date  . Mandible surgery    . Cholecystectomy    . Cesarean section      x2  . Diagnostic laparoscopy    . Robotic assisted total hysterectomy N/A 10/16/2013    Procedure: ROBOTIC ASSISTED TOTAL HYSTERECTOMY;  Surgeon: Lovenia Kim, MD;  Location: [facility];  Service: Gynecology;  Laterality: N/A;  . Bilateral salpingectomy Bilateral 10/16/2013    Procedure: BILATERAL SALPINGECTOMY;  Surgeon:  Lovenia Kim, MD;  Location: [facility];  Service: Gynecology;  Laterality: Bilateral;  . Abdominal hysterectomy     Family History  Problem Relation Age of Onset  . Cancer Paternal Grandfather   . Stroke Maternal Grandmother    Social History  Substance Use Topics  . Smoking status: Never Smoker   . Smokeless tobacco: None  . Alcohol Use: Yes     Comment: socially    OB History    No data available     Review of Systems  Constitutional: Positive for chills. Negative for fever.  Respiratory: Positive for cough. Negative for shortness of breath.   Cardiovascular: Negative for chest pain.  Gastrointestinal: Positive for abdominal pain and diarrhea. Negative for nausea, vomiting, melena, hematochezia and hematemesis.  Genitourinary: Negative for hematuria, vaginal bleeding and vaginal discharge.  All other systems reviewed and are negative.     Allergies  Sulfa antibiotics  Home Medications   Prior to Admission medications   Medication Sig Start Date End Date Taking? Authorizing Provider  ADDERALL XR 30 MG 24 hr capsule 60 mg daily. Take two capsules by mouth daily 04/30/15   Historical Provider, MD  ALPRAZolam Duanne Moron) 1 MG tablet Take 1 mg by mouth at bedtime as needed for anxiety or sleep.    Historical Provider, MD  amitriptyline (ELAVIL) 25 MG tablet Take 25 mg by mouth at bedtime. 04/25/15   Historical Provider, MD  cyclobenzaprine (FLEXERIL) 10 MG tablet Take 10 mg by mouth 3 (three) times daily  as needed.     Historical Provider, MD  diclofenac (VOLTAREN) 75 MG EC tablet Take 75 mg by mouth 2 (two) times daily as needed (inflammation).    Historical Provider, MD  FLUoxetine (PROZAC) 10 MG capsule Take 10 mg by mouth daily.    Historical Provider, MD  LYRICA 150 MG capsule Take 150 mg by mouth 2 (two) times daily. 04/25/15   Historical Provider, MD  oxyCODONE-acetaminophen (PERCOCET/ROXICET) 5-325 MG per tablet Take 1-2 tablets by mouth every 4 (four) hours as needed for  severe pain. Patient not taking: Reported on 05/05/2015 10/17/13   Brien Few, MD  phentermine 37.5 MG capsule Take 37.5 mg by mouth every morning.    Historical Provider, MD  predniSONE (DELTASONE) 20 MG tablet Take 3 tablets (60 mg total) by mouth daily. Take 60 mg by mouth daily week 1, then 40mg  by mouth daily for week 2, then 20mg  daily for week 3 05/05/15   Monico Blitz, PA-C  PRESCRIPTION MEDICATION Analgesic injection into Right hip    Historical Provider, MD  PRESCRIPTION MEDICATION Steroid injection into Left hip    Historical Provider, MD  temazepam (RESTORIL) 30 MG capsule Take 1 capsule by mouth at bedtime as needed and may repeat dose one time if needed. 11/04/13   Historical Provider, MD  tiZANidine (ZANAFLEX) 4 MG tablet  04/29/15   Historical Provider, MD  traMADol (ULTRAM) 50 MG tablet Take 1-2 tablets (50-100 mg total) by mouth every 6 (six) hours as needed for moderate pain. Patient not taking: Reported on 05/05/2015 10/17/13   Brien Few, MD  traZODone (DESYREL) 50 MG tablet Take 100 mg by mouth at bedtime.    Historical Provider, MD   BP 126/85 mmHg  Pulse 97  Temp(Src) 97.9 F (36.6 C) (Oral)  Resp 18  SpO2 99%  LMP 10/04/2013 Physical Exam  Constitutional: She is oriented to person, place, and time. She appears well-developed and well-nourished.  Non-toxic appearance. She does not have a sickly appearance. She does not appear ill.  HENT:  Head: Normocephalic and atraumatic.  Mouth/Throat: Oropharynx is clear and moist.  Eyes: Conjunctivae are normal. Pupils are equal, round, and reactive to light.  Neck: Normal range of motion. Neck supple.  Cardiovascular: Normal rate, regular rhythm and normal heart sounds.   No murmur heard. Pulmonary/Chest: Effort normal and breath sounds normal. No accessory muscle usage or stridor. No respiratory distress. She has no wheezes. She has no rhonchi. She has no rales.  Abdominal: Soft. Bowel sounds are normal. She exhibits no  distension. There is tenderness in the right upper quadrant and epigastric area. There is no rigidity, no rebound, no guarding and no CVA tenderness.  Musculoskeletal: Normal range of motion.  Lymphadenopathy:    She has no cervical adenopathy.  Neurological: She is alert and oriented to person, place, and time.  Speech clear without dysarthria.  Skin: Skin is warm and dry.  Psychiatric: She has a normal mood and affect. Her behavior is normal.    ED Course  Procedures (including critical care time) Labs Review Labs Reviewed  COMPREHENSIVE METABOLIC PANEL - Abnormal; Notable for the following:    Glucose, Bld 126 (*)    Creatinine, Ser 1.21 (*)    GFR calc non Af Amer 53 (*)    All other components within normal limits  URINALYSIS, ROUTINE W REFLEX MICROSCOPIC (NOT AT [facility]) - Abnormal; Notable for the following:    Color, Urine AMBER (*)    APPearance CLOUDY (*)  Hgb urine dipstick MODERATE (*)    Leukocytes, UA SMALL (*)    All other components within normal limits  URINE MICROSCOPIC-ADD ON - Abnormal; Notable for the following:    Squamous Epithelial / LPF 6-30 (*)    Bacteria, UA MANY (*)    Crystals CA OXALATE CRYSTALS (*)    All other components within normal limits  LIPASE, BLOOD  CBC  POC URINE PREG, ED    Imaging Review Dg Chest 2 View  01/11/2016  CLINICAL DATA:  Sudden onset right upper quadrant pain EXAM: CHEST  2 VIEW COMPARISON:  05/05/2015 FINDINGS: The heart size and mediastinal contours are within normal limits. Both lungs are clear. The visualized skeletal structures are unremarkable. IMPRESSION: No active cardiopulmonary disease. Electronically Signed   By: Kathreen Devoid   On: 01/11/2016 12:25   Ct Renal Stone Study  01/11/2016  CLINICAL DATA:  45 year old female with sudden onset right flank pain now resolved. Past history of kidney stones. EXAM: CT ABDOMEN AND PELVIS WITHOUT CONTRAST TECHNIQUE: Multidetector CT imaging of the abdomen and pelvis was  performed following the standard protocol without IV contrast. COMPARISON:  None. FINDINGS: Lower chest:  No acute findings. Hepatobiliary: No mass visualized on this un-enhanced exam. The gallbladder is surgically absent. Pancreas: No mass or inflammatory process identified on this un-enhanced exam. Spleen: Within normal limits in size. Adrenals/Urinary Tract: Normal appearance of the adrenal glands. Small 2 mm stone at the right ureterovesicular junction. Minimal fullness of the renal pelvis without significant hydronephrosis or perinephric stranding. No additional nephrolithiasis identified. Stomach/Bowel: No evidence of obstruction or focal bowel wall thickening. Normal appendix in the right lower quadrant. The terminal ileum is unremarkable. Vascular/Lymphatic: No pathologically enlarged lymph nodes. No evidence of abdominal aortic aneurysm. Reproductive: No mass or other significant abnormality. Surgical changes of prior hysterectomy. Other: None. Musculoskeletal:  No suspicious bone lesions identified. IMPRESSION: 1. Small (2 mm) stone at the right UVJ with minimal to no obstruction. There may be very mild fullness of the right renal pelvis. 2. No additional nephrolithiasis identified. 3. Surgical changes of prior cholecystectomy. Electronically Signed   By: Jacqulynn Cadet M.D.   On: 01/11/2016 12:28   I have personally reviewed and evaluated these images and lab results as part of my medical decision-making.   EKG Interpretation None      MDM   Final diagnoses:  Ureterolithiasis   Patient presents with sudden onset right flank/RUQ abdominal pain that began this morning.  VSS, NAD.  On exam, heart RRR, lungs CTAB, abdomen soft with RUQ and epigastric tenderness without rebound, guarding, or rigidity.  No CVA tenderness.  Labs without acute abnormalities.  Doubt pyelonephritis, gall bladder etiology, doubt ovarian torsion, possible nephrolithiasis.  Will obtain CT renal stone study, IVF, and  morphine.   CT renal stone study remarkable for small 2 mm right UVJ with minimal to no obstruction. UA has not resulted.  Patient states she would like to leave and will follow up with her PCP.  She reports that he is able to view her results here.  Discussed return precautions.  Patient agrees and acknowledges the above plan for discharge.   Case has been discussed with Dr. Tomi Bamberger who agrees with the above plan for discharge.      Gloriann Loan, PA-C 01/11/16 [place], MD 01/11/16 [phone]

## 2016-01-11 NOTE — ED Notes (Signed)
Pt reports onset this am of severe sharp intermittent RLQ pain. Has chills but denies n/v/d.

## 2016-01-11 NOTE — Discharge Instructions (Signed)
Your urine tests are not back at this time.  There is evidence of a kidney stone.  Please follow up with your doctor regarding your urinalysis.  Kidney Stones Kidney stones (urolithiasis) are deposits that form inside your kidneys. The intense pain is caused by the stone moving through the urinary tract. When the stone moves, the ureter goes into spasm around the stone. The stone is usually passed in the urine.  CAUSES   A disorder that makes certain neck glands produce too much parathyroid hormone (primary hyperparathyroidism).  A buildup of uric acid crystals, similar to gout in your joints.  Narrowing (stricture) of the ureter.  A kidney obstruction present at birth (congenital obstruction).  Previous surgery on the kidney or ureters.  Numerous kidney infections. SYMPTOMS   Feeling sick to your stomach (nauseous).  Throwing up (vomiting).  Blood in the urine (hematuria).  Pain that usually spreads (radiates) to the groin.  Frequency or urgency of urination. DIAGNOSIS   Taking a history and physical exam.  Blood or urine tests.  CT scan.  Occasionally, an examination of the inside of the urinary bladder (cystoscopy) is performed. TREATMENT   Observation.  Increasing your fluid intake.  Extracorporeal shock wave lithotripsy--This is a noninvasive procedure that uses shock waves to break up kidney stones.  Surgery may be needed if you have severe pain or persistent obstruction. There are various surgical procedures. Most of the procedures are performed with the use of small instruments. Only small incisions are needed to accommodate these instruments, so recovery time is minimized. The size, location, and chemical composition are all important variables that will determine the proper choice of action for you. Talk to your health care provider to better understand your situation so that you will minimize the risk of injury to yourself and your kidney.  HOME CARE  INSTRUCTIONS   Drink enough water and fluids to keep your urine clear or pale yellow. This will help you to pass the stone or stone fragments.  Strain all urine through the provided strainer. Keep all particulate matter and stones for your health care provider to see. The stone causing the pain may be as small as a grain of salt. It is very important to use the strainer each and every time you pass your urine. The collection of your stone will allow your health care provider to analyze it and verify that a stone has actually passed. The stone analysis will often identify what you can do to reduce the incidence of recurrences.  Only take over-the-counter or prescription medicines for pain, discomfort, or fever as directed by your health care provider.  Keep all follow-up visits as told by your health care provider. This is important.  Get follow-up X-rays if required. The absence of pain does not always mean that the stone has passed. It may have only stopped moving. If the urine remains completely obstructed, it can cause loss of kidney function or even complete destruction of the kidney. It is your responsibility to make sure X-rays and follow-ups are completed. Ultrasounds of the kidney can show blockages and the status of the kidney. Ultrasounds are not associated with any radiation and can be performed easily in a matter of minutes.  Make changes to your daily diet as told by your health care provider. You may be told to:  Limit the amount of salt that you eat.  Eat 5 or more servings of fruits and vegetables each day.  Limit the amount of meat, poultry,  fish, and eggs that you eat.  Collect a 24-hour urine sample as told by your health care provider.You may need to collect another urine sample every 6-12 months. SEEK MEDICAL CARE IF:  You experience pain that is progressive and unresponsive to any pain medicine you have been prescribed. SEEK IMMEDIATE MEDICAL CARE IF:   Pain cannot be  controlled with the prescribed medicine.  You have a fever or shaking chills.  The severity or intensity of pain increases over 18 hours and is not relieved by pain medicine.  You develop a new onset of abdominal pain.  You feel faint or pass out.  You are unable to urinate.   This information is not intended to replace advice given to you by your health care provider. Make sure you discuss any questions you have with your health care provider.   Document Released: 10/03/2005 Document Revised: 06/24/2015 Document Reviewed: 03/06/2013 Elsevier Interactive Patient Education [employer].

## 2016-03-07 ENCOUNTER — Encounter: Payer: 59 | Attending: Internal Medicine | Admitting: Dietician

## 2016-03-07 ENCOUNTER — Encounter: Payer: Self-pay | Admitting: Dietician

## 2016-03-07 VITALS — Ht 62.0 in | Wt 181.4 lb

## 2016-03-07 DIAGNOSIS — E669 Obesity, unspecified: Secondary | ICD-10-CM | POA: Diagnosis not present

## 2016-03-07 NOTE — Patient Instructions (Signed)
Aim to eat 3 times per day. Have carbs and protein together.  For breakfast - try eggs with bread or fruit or peanut butter sandwich or oatmeal with peanut butter and fruit. Talk to your family about doing 2 dinner meals per week (Friday and Saturday). For when you plan dinner meals, prepare a vegetable, protein, and carb/starch. Have prewashed vegetables and frozen vegetables on hand to cook quickly.  Plan to walk with your coworkers 1-2 x week.

## 2016-03-07 NOTE — Progress Notes (Signed)
  Medical Nutrition Therapy:  Appt start time: [id number] end time:  1805.   Assessment:  Primary concerns today: [name] is here today since has been told that she is "obese". Continues to gain weight around the middle. Just diagnosed with fibromyalgia which is "driving her nuts". Also has arthritis, DJD, IBS, depression, and anxiety. Sees a psychiatrist. Has "ADD". Mom just passed away in 28-Dec-2022 of congestive heart failure. Feels like she has never learned to eat well. Has worked a Teacher, music jobs in the past. Has days when she does not eat at all but normally doesn't feel hungry. Has lost about 10+ lbs since 12-28-22 mostly by not eating. Was taking phentermine for years but stopped about a week ago. Does not drink many fluids.    Works in an Technical brewer. Lives with her husband and 2 children (ages 45 and 44). States that her husband does most of the food shopping and meal preparation at home. Family does not cook much now. They eat meals on the go a lot d/t kids' activities.   Has trouble exercising d/t pain. Prefers swimming for exercise.  Would like to learn what to eat if she doesn't have much of an appetite and lose weight (get down to around 150-170 lbs)  Preferred Learning Style:   No preference indicated   Learning Readiness:   Ready   MEDICATIONS: see list   DIETARY INTAKE:  Usual eating pattern includes 1 meals and 0 snacks per day.  Avoided foods include none    24-hr recall:  B ( AM): none or donut or McDonald's biscuit or boiled egg Snk ( AM): none L ( PM): nothing 98% of the time Snk ( PM): none D ( PM): none or [employer] or [employer] or [employer]  Snk ( PM): none Beverages: sweet tea, moutain dew, Dr. Malachi Bonds (does not drink much fluid)  Usual physical activity: none  Estimated energy needs: 1800 calories 200 g carbohydrates 135 g protein 50 g fat  Progress Towards Goal(s):  In progress.   Nutritional Diagnosis:  [place]-3.3 Overweight/obesity As related to hx of meal  skipping and unstructured meals/snacks.  As evidenced by BMI of 33.2.    Intervention:  Nutrition counseling provided.   Teaching Method Utilized:  Visual Auditory Hands on  Handouts given during visit include:  MyPlate Handout  Meal Plan  15 g CHO Snacks  Barriers to learning/adherence to lifestyle change: chronic health conditions, doesn't feel hungry  Demonstrated degree of understanding via:  Teach Back   Monitoring/Evaluation:  Dietary intake, exercise, and body weight in 1 month(s).

## 2016-04-25 ENCOUNTER — Encounter: Payer: 59 | Attending: Internal Medicine | Admitting: Dietician

## 2016-04-25 ENCOUNTER — Encounter: Payer: Self-pay | Admitting: Dietician

## 2016-04-25 VITALS — Ht 62.0 in | Wt 180.4 lb

## 2016-04-25 DIAGNOSIS — E669 Obesity, unspecified: Secondary | ICD-10-CM | POA: Diagnosis present

## 2016-04-25 NOTE — Patient Instructions (Addendum)
For the next month, work on eating 3 times per day around the same time each day. Meals can be snack size, but the most important thing is that you eat 3 x day.  Try limiting carbonation/caffeine/sweet drinks which are likely filling you up.  Try to sip water throughout the day.

## 2016-04-25 NOTE — Progress Notes (Signed)
  Medical Nutrition Therapy:  Appt start time: [id number] end time:  525   Assessment:  Primary concerns today: [name] is here today for a follow up for obesity. Lost about 1 lb since 03/07/2016. Started taking Lasix since she is having some swelling. Isn't sure if swelling is from fibromyalgia. Has not been walking as much as she wanted, though has been swimming and more active gardening. A coworker is out on leave so busier than before. Drinking more water. Husband does most of grocery shopping. Trying to make better choices but doesn't always have snacks with her. Still doesn't have much of an appetite.   States that she does not skip meals in order to lose weight, however still eating only about one time per day. Does not drink anything after lunch since she doesn't crave it or think about it.   Preferred Learning Style:   No preference indicated   Learning Readiness:   Ready   MEDICATIONS: see list   DIETARY INTAKE:  Usual eating pattern includes 1 meals and 0 snacks per day.  Avoided foods include none    24-hr recall:  B ( AM): none or donut or McDonald's sausage burrito or boiled egg or blueberry muffin (most of the time) Snk ( AM): none L ( PM): nothing 98% of the time or [employer]'s lettuce wraps or guacamole and chips or peanut butter and jelly sandwich on weekend Snk ( PM): none D ( PM): none or lasagna or [employer] steak and shrimp  Snk ( PM): none or [place] [employer]  Beverages: 1 20 oz sweet tea, moutain dew or Dr. Malachi Bonds, water at lunch, nothing to drink after lunch  Usual physical activity: swimming some in the pool, gardening  Estimated energy needs: 1800 calories 200 g carbohydrates 135 g protein 50 g fat  Progress Towards Goal(s):  In progress.   Nutritional Diagnosis:  [place]-3.3 Overweight/obesity As related to hx of meal skipping and unstructured meals/snacks.  As evidenced by BMI of 33.2.    Intervention:  Nutrition counseling provided.  Plan: For the next  month, work on eating 3 times per day around the same time each day. Meals can be snack size, but the most important thing is that you eat 3 x day.  Try limiting carbonation/caffeine/sweet drinks which are likely filling you up.  Try to sip water throughout the day.   Teaching Method Utilized:  Visual Auditory Hands on  Handouts given during visit include:  none  Barriers to learning/adherence to lifestyle change: chronic health conditions, doesn't feel hungry  Demonstrated degree of understanding via:  Teach Back   Monitoring/Evaluation:  Dietary intake, exercise, and body weight in 1 month(s).

## 2016-06-06 ENCOUNTER — Ambulatory Visit: Payer: 59 | Admitting: Dietician

## 2016-08-16 ENCOUNTER — Other Ambulatory Visit: Payer: Self-pay | Admitting: Internal Medicine

## 2016-08-16 DIAGNOSIS — R1084 Generalized abdominal pain: Secondary | ICD-10-CM

## 2016-08-16 DIAGNOSIS — K589 Irritable bowel syndrome without diarrhea: Secondary | ICD-10-CM

## 2016-08-22 ENCOUNTER — Ambulatory Visit
Admission: RE | Admit: 2016-08-22 | Discharge: 2016-08-22 | Disposition: A | Discharge status: Home / Self Care | Payer: 59 | Source: Ambulatory Visit | Attending: Internal Medicine | Admitting: Internal Medicine

## 2016-08-22 ENCOUNTER — Other Ambulatory Visit: Payer: Self-pay | Admitting: Family Medicine

## 2016-08-22 ENCOUNTER — Ambulatory Visit
Admission: RE | Admit: 2016-08-22 | Discharge: 2016-08-22 | Disposition: A | Discharge status: Home / Self Care | Payer: 59 | Source: Ambulatory Visit | Attending: Family Medicine | Admitting: Family Medicine

## 2016-08-22 DIAGNOSIS — R109 Unspecified abdominal pain: Secondary | ICD-10-CM

## 2016-08-22 DIAGNOSIS — K589 Irritable bowel syndrome without diarrhea: Secondary | ICD-10-CM

## 2016-08-22 DIAGNOSIS — R1084 Generalized abdominal pain: Secondary | ICD-10-CM

## 2016-09-26 ENCOUNTER — Other Ambulatory Visit: Payer: Self-pay | Admitting: Internal Medicine

## 2016-09-26 DIAGNOSIS — R1084 Generalized abdominal pain: Secondary | ICD-10-CM

## 2016-09-30 ENCOUNTER — Other Ambulatory Visit: Payer: 59

## 2016-10-18 DIAGNOSIS — M797 Fibromyalgia: Secondary | ICD-10-CM | POA: Diagnosis not present

## 2016-10-21 DIAGNOSIS — M797 Fibromyalgia: Secondary | ICD-10-CM | POA: Diagnosis not present

## 2016-10-24 DIAGNOSIS — R350 Frequency of micturition: Secondary | ICD-10-CM | POA: Diagnosis not present

## 2016-10-25 DIAGNOSIS — M797 Fibromyalgia: Secondary | ICD-10-CM | POA: Diagnosis not present

## 2016-10-28 DIAGNOSIS — M797 Fibromyalgia: Secondary | ICD-10-CM | POA: Diagnosis not present

## 2016-11-01 DIAGNOSIS — M797 Fibromyalgia: Secondary | ICD-10-CM | POA: Diagnosis not present

## 2016-11-04 DIAGNOSIS — M797 Fibromyalgia: Secondary | ICD-10-CM | POA: Diagnosis not present

## 2016-11-08 DIAGNOSIS — M797 Fibromyalgia: Secondary | ICD-10-CM | POA: Diagnosis not present

## 2016-11-09 DIAGNOSIS — M797 Fibromyalgia: Secondary | ICD-10-CM | POA: Diagnosis not present

## 2016-11-10 DIAGNOSIS — R05 Cough: Secondary | ICD-10-CM | POA: Diagnosis not present

## 2016-11-22 DIAGNOSIS — M797 Fibromyalgia: Secondary | ICD-10-CM | POA: Diagnosis not present

## 2016-11-25 DIAGNOSIS — M797 Fibromyalgia: Secondary | ICD-10-CM | POA: Diagnosis not present

## 2016-11-29 DIAGNOSIS — R350 Frequency of micturition: Secondary | ICD-10-CM | POA: Diagnosis not present

## 2016-12-02 DIAGNOSIS — M797 Fibromyalgia: Secondary | ICD-10-CM | POA: Diagnosis not present

## 2016-12-05 DIAGNOSIS — R350 Frequency of micturition: Secondary | ICD-10-CM | POA: Diagnosis not present

## 2016-12-06 DIAGNOSIS — M5136 Other intervertebral disc degeneration, lumbar region: Secondary | ICD-10-CM | POA: Diagnosis not present

## 2016-12-07 DIAGNOSIS — M797 Fibromyalgia: Secondary | ICD-10-CM | POA: Diagnosis not present

## 2016-12-14 DIAGNOSIS — M797 Fibromyalgia: Secondary | ICD-10-CM | POA: Diagnosis not present

## 2016-12-20 DIAGNOSIS — M5136 Other intervertebral disc degeneration, lumbar region: Secondary | ICD-10-CM | POA: Diagnosis not present

## 2016-12-20 DIAGNOSIS — G894 Chronic pain syndrome: Secondary | ICD-10-CM | POA: Diagnosis not present

## 2016-12-20 DIAGNOSIS — M545 Low back pain: Secondary | ICD-10-CM | POA: Diagnosis not present

## 2016-12-22 DIAGNOSIS — M545 Low back pain: Secondary | ICD-10-CM | POA: Diagnosis not present

## 2016-12-22 DIAGNOSIS — M5136 Other intervertebral disc degeneration, lumbar region: Secondary | ICD-10-CM | POA: Diagnosis not present

## 2016-12-28 DIAGNOSIS — M797 Fibromyalgia: Secondary | ICD-10-CM | POA: Diagnosis not present

## 2017-01-09 DIAGNOSIS — M5106 Intervertebral disc disorders with myelopathy, lumbar region: Secondary | ICD-10-CM | POA: Diagnosis not present

## 2017-01-09 DIAGNOSIS — M542 Cervicalgia: Secondary | ICD-10-CM | POA: Diagnosis not present

## 2017-01-12 DIAGNOSIS — Z0000 Encounter for general adult medical examination without abnormal findings: Secondary | ICD-10-CM | POA: Diagnosis not present

## 2017-01-12 DIAGNOSIS — Z1389 Encounter for screening for other disorder: Secondary | ICD-10-CM | POA: Diagnosis not present

## 2017-01-12 DIAGNOSIS — M797 Fibromyalgia: Secondary | ICD-10-CM | POA: Diagnosis not present

## 2017-01-12 DIAGNOSIS — R05 Cough: Secondary | ICD-10-CM | POA: Diagnosis not present

## 2017-01-13 DIAGNOSIS — M545 Low back pain: Secondary | ICD-10-CM | POA: Diagnosis not present

## 2017-01-24 DIAGNOSIS — M797 Fibromyalgia: Secondary | ICD-10-CM | POA: Diagnosis not present

## 2017-01-31 DIAGNOSIS — M797 Fibromyalgia: Secondary | ICD-10-CM | POA: Diagnosis not present

## 2017-02-03 DIAGNOSIS — M797 Fibromyalgia: Secondary | ICD-10-CM | POA: Diagnosis not present

## 2017-02-08 DIAGNOSIS — M797 Fibromyalgia: Secondary | ICD-10-CM | POA: Diagnosis not present

## 2017-02-13 DIAGNOSIS — M5106 Intervertebral disc disorders with myelopathy, lumbar region: Secondary | ICD-10-CM | POA: Diagnosis not present

## 2017-02-13 DIAGNOSIS — M545 Low back pain: Secondary | ICD-10-CM | POA: Diagnosis not present

## 2017-02-15 DIAGNOSIS — M797 Fibromyalgia: Secondary | ICD-10-CM | POA: Diagnosis not present

## 2017-02-22 DIAGNOSIS — R350 Frequency of micturition: Secondary | ICD-10-CM | POA: Diagnosis not present

## 2017-03-01 DIAGNOSIS — M797 Fibromyalgia: Secondary | ICD-10-CM | POA: Diagnosis not present

## 2017-03-06 DIAGNOSIS — M47814 Spondylosis without myelopathy or radiculopathy, thoracic region: Secondary | ICD-10-CM | POA: Diagnosis not present

## 2017-03-06 DIAGNOSIS — M546 Pain in thoracic spine: Secondary | ICD-10-CM | POA: Diagnosis not present

## 2017-03-08 DIAGNOSIS — M797 Fibromyalgia: Secondary | ICD-10-CM | POA: Diagnosis not present

## 2017-03-15 DIAGNOSIS — M797 Fibromyalgia: Secondary | ICD-10-CM | POA: Diagnosis not present

## 2017-03-17 DIAGNOSIS — M545 Low back pain: Secondary | ICD-10-CM | POA: Diagnosis not present

## 2017-03-17 DIAGNOSIS — M5106 Intervertebral disc disorders with myelopathy, lumbar region: Secondary | ICD-10-CM | POA: Diagnosis not present

## 2017-03-22 DIAGNOSIS — M7989 Other specified soft tissue disorders: Secondary | ICD-10-CM | POA: Diagnosis not present

## 2017-03-23 DIAGNOSIS — M797 Fibromyalgia: Secondary | ICD-10-CM | POA: Diagnosis not present

## 2017-03-29 DIAGNOSIS — M797 Fibromyalgia: Secondary | ICD-10-CM | POA: Diagnosis not present

## 2017-03-31 DIAGNOSIS — E079 Disorder of thyroid, unspecified: Secondary | ICD-10-CM | POA: Diagnosis not present

## 2017-03-31 DIAGNOSIS — F32A Depression, unspecified: Secondary | ICD-10-CM | POA: Insufficient documentation

## 2017-04-10 DIAGNOSIS — M7711 Lateral epicondylitis, right elbow: Secondary | ICD-10-CM | POA: Diagnosis not present

## 2017-04-12 ENCOUNTER — Encounter: Payer: Self-pay | Admitting: Neurology

## 2017-04-14 DIAGNOSIS — M797 Fibromyalgia: Secondary | ICD-10-CM | POA: Diagnosis not present

## 2017-04-17 DIAGNOSIS — M797 Fibromyalgia: Secondary | ICD-10-CM | POA: Diagnosis not present

## 2017-04-28 DIAGNOSIS — E079 Disorder of thyroid, unspecified: Secondary | ICD-10-CM | POA: Diagnosis not present

## 2017-05-01 DIAGNOSIS — Z7189 Other specified counseling: Secondary | ICD-10-CM | POA: Diagnosis not present

## 2017-05-02 DIAGNOSIS — M797 Fibromyalgia: Secondary | ICD-10-CM | POA: Diagnosis not present

## 2017-05-09 DIAGNOSIS — Z136 Encounter for screening for cardiovascular disorders: Secondary | ICD-10-CM | POA: Diagnosis not present

## 2017-05-15 DIAGNOSIS — R232 Flushing: Secondary | ICD-10-CM | POA: Diagnosis not present

## 2017-05-16 DIAGNOSIS — M797 Fibromyalgia: Secondary | ICD-10-CM | POA: Diagnosis not present

## 2017-05-17 ENCOUNTER — Encounter: Payer: Self-pay | Admitting: Neurology

## 2017-05-17 ENCOUNTER — Other Ambulatory Visit: Payer: 59

## 2017-05-17 ENCOUNTER — Ambulatory Visit (INDEPENDENT_AMBULATORY_CARE_PROVIDER_SITE_OTHER): Payer: 59 | Admitting: Neurology

## 2017-05-17 VITALS — BP 110/88 | HR 103 | Ht 63.0 in | Wt 221.4 lb

## 2017-05-17 DIAGNOSIS — R202 Paresthesia of skin: Secondary | ICD-10-CM | POA: Diagnosis not present

## 2017-05-17 NOTE — Patient Instructions (Signed)
1.  Checks labs 2.  Discuss with your primary care physician about stopping topiramate 3.  If you choose to proceed with nerve testing, please call my office

## 2017-05-17 NOTE — Progress Notes (Signed)
[facility] Clinic Note - Initial Visit   Date: 05/17/17  MURLENE YELLOWHAIR MRN: [id number] DOB: December 14, 1970   Dear Dr. Timothy Lasso:  Thank you for your kind referral of [name] for consultation of bilateral hand and feet paresthesias. Although her history is well known to you, please allow [name] to reiterate it for the purpose of our medical record. The patient was accompanied to the clinic by self.   History of Present Illness: [name] is a 46 y.o. right-handed Caucasian female with ADD, depression, bipolar disorder, fibromyalgia, chronic low back pain, and hypothyroidism presenting for evaluation of generalized paresthesias.    Starting around 2016, she has spells of shooting pain going through the chest, legs, and arms.  She complains of "heat" sensation of the arms and legs.  She has pin and needles of the soles of the feet and palms of the hands.  She was diagnosed with fibromyalgia around the same time. She was tried on gabapentin and Lyrica, which did not help.She currently takes Cymbalta 20mg  for mood and fibromyalgia written by her psychiatrist. These symptoms are intermittent and occurs about once per month, lasting 2 minutes. Stress always triggers these spells and recalls having worsening pain when her mother passed away last year.  She also complains of generalized fatigue and physically exhausted.  She goes to physical therapy and acupucture which is helping her stress management.  She takes topiramate 100mg  for weight loss for the past 6 months.    Past Medical History:  Diagnosis Date  . ADD (attention deficit disorder)   . Anxiety   . Arthropathy, unspecified, site unspecified   . Chronic fatigue syndrome   . Depression   . Disc degeneration   . Disturbance of skin sensation   . Fibromyalgia   . History of kidney stones   . Sleep disorder 01/08/2014  . Sleep disturbance, unspecified   . Unspecified nonpsychotic mental disorder     Past  Surgical History:  Procedure Laterality Date  . ABDOMINAL HYSTERECTOMY    . BILATERAL SALPINGECTOMY Bilateral 10/16/2013   Procedure: BILATERAL SALPINGECTOMY;  Surgeon: Lenoard Aden, MD;  Location: [facility];  Service: Gynecology;  Laterality: Bilateral;  . CESAREAN SECTION     x2  . CHOLECYSTECTOMY    . DIAGNOSTIC LAPAROSCOPY    . MANDIBLE SURGERY    . ROBOTIC ASSISTED TOTAL HYSTERECTOMY N/A 10/16/2013   Procedure: ROBOTIC ASSISTED TOTAL HYSTERECTOMY;  Surgeon: Lenoard Aden, MD;  Location: [facility];  Service: Gynecology;  Laterality: N/A;     Medications:  Outpatient Encounter Prescriptions as of 05/17/2017  Medication Sig Note  . ADDERALL XR 30 MG 24 hr capsule 60 mg daily. Take two capsules by mouth daily   . ALPRAZolam (XANAX) 1 MG tablet Take 1 mg by mouth at bedtime as needed for anxiety or sleep.   . cyclobenzaprine (FLEXERIL) 10 MG tablet Take 10 mg by mouth 3 (three) times daily as needed.    . diclofenac (VOLTAREN) 75 MG EC tablet Take 75 mg by mouth 2 (two) times daily as needed (inflammation).   . DULoxetine (CYMBALTA) 20 MG capsule Take 20 mg by mouth daily.   Marland Kitchen FLUoxetine (PROZAC) 10 MG capsule Take 10 mg by mouth daily.   . furosemide (LASIX) 10 MG/ML solution Take 40 mg by mouth daily.   Marland Kitchen LYRICA 150 MG capsule Take 150 mg by mouth 2 (two) times daily.   . phentermine 37.5 MG capsule Take 37.5 mg by  mouth every morning.   . predniSONE (DELTASONE) 20 MG tablet Take 3 tablets (60 mg total) by mouth daily. Take 60 mg by mouth daily week 1, then 40mg  by mouth daily for week 2, then 20mg  daily for week 3   . PRESCRIPTION MEDICATION Analgesic injection into Right hip 05/05/2015: Patient to return for follow up in 4 weeks  . PRESCRIPTION MEDICATION Steroid injection into Left hip 05/05/2015: Patient to return for follow up in 4 weeks time  . temazepam (RESTORIL) 30 MG capsule Take 1 capsule by mouth at bedtime as needed and may repeat dose one time if needed. 05/05/2015: Patient  doesn't take Temazepam and Trazodone together  . tiZANidine (ZANAFLEX) 4 MG tablet  05/05/2015: Not yet started this medication. Medication ready for collection at Pharmacy  . traMADol (ULTRAM) 50 MG tablet Take 1-2 tablets (50-100 mg total) by mouth every 6 (six) hours as needed for moderate pain. (Patient not taking: Reported on 05/05/2015)   . traZODone (DESYREL) 50 MG tablet Take 100 mg by mouth at bedtime. 05/05/2015: Patient doesn't take Trazodone and Temazepam together  . [DISCONTINUED] amitriptyline (ELAVIL) 25 MG tablet Take 25 mg by mouth at bedtime.   . [DISCONTINUED] oxyCODONE-acetaminophen (PERCOCET/ROXICET) 5-325 MG per tablet Take 1-2 tablets by mouth every 4 (four) hours as needed for severe pain. (Patient not taking: Reported on 05/05/2015)    No facility-administered encounter medications on file as of 05/17/2017.      Allergies:  Allergies  Allergen Reactions  . Lyrica [Pregabalin]   . Sulfa Antibiotics Hives    All over body  . Elavil [Amitriptyline Hcl] Anxiety    Family History: Family History  Problem Relation Age of Onset  . Heart attack Mother   . Hypertension Mother   . Neuropathy Father   . Diabetes Father   . Cancer Paternal Grandfather   . Stroke Maternal Grandmother     Social History: Social History  Substance Use Topics  . Smoking status: Never Smoker  . Smokeless tobacco: Never Used  . 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  She lives at home with husband and two children.   Highest level of education:  Some college    Review of Systems:  CONSTITUTIONAL: No fevers, chills, night sweats, or weight loss.   EYES: No visual changes or eye pain ENT: No hearing changes.  No history of nose bleeds.   RESPIRATORY: No cough, wheezing and shortness of breath.   CARDIOVASCULAR: Negative for chest pain, and palpitations.   GI: Negative for abdominal  discomfort, blood in stools or black stools.  No recent change in bowel habits.   GU:  No history of incontinence.   MUSCLOSKELETAL: +history of joint pain or swelling.  +myalgias.   SKIN: Negative for lesions, rash, and itching.   HEMATOLOGY/ONCOLOGY: Negative for prolonged bleeding, bruising easily, and swollen nodes.     ENDOCRINE: Negative for cold or heat intolerance, polydipsia or goiter.   PSYCH:  +depression or anxiety symptoms.   NEURO: As Above.   Vital Signs:  BP 110/88   Pulse (!) 103   Ht 5\' 3"  (1.6 m)   Wt 221 lb 6.4 oz (100.4 kg)   LMP 10/05/2013   SpO2 98%   BMI 39.22 kg/m    General Medical Exam:   General:  Well appearing, comfortable.   Eyes/ENT: see cranial nerve  examination.   Neck: No masses appreciated.  Full range of motion without tenderness.  No carotid bruits. Respiratory:  Clear to auscultation, good air entry bilaterally.   Cardiac:  Regular rate and rhythm, no murmur.   Extremities:  No deformities, edema, or skin discoloration.  Skin:  No rashes or lesions.  Neurological Exam: MENTAL STATUS including orientation to time, place, person, recent and remote memory, attention span and concentration, language, and fund of knowledge is normal.  Speech is not dysarthric.  CRANIAL NERVES: II:  No visual field defects.  Unremarkable fundi.   III-IV-VI: Pupils equal round and reactive to light.  Normal conjugate, extra-ocular eye movements in all directions of gaze.  No nystagmus.  No ptosis.   V:  Normal facial sensation.     VII:  Normal facial symmetry and movements.   VIII:  Normal hearing and vestibular function.   IX-X:  Normal palatal movement.   XI:  Normal shoulder shrug and head rotation.   XII:  Normal tongue strength and range of motion, no deviation or fasciculation.  MOTOR:  No atrophy, fasciculations or abnormal movements.  No pronator drift.  Tone is normal.    Right Upper Extremity:    Left Upper Extremity:    Deltoid  5/5   Deltoid   5/5   Biceps  5/5   Biceps  5/5   Triceps  5/5   Triceps  5/5   Wrist extensors  5/5   Wrist extensors  5/5   Wrist flexors  5/5   Wrist flexors  5/5   Finger extensors  5/5   Finger extensors  5/5   Finger flexors  5/5   Finger flexors  5/5   Dorsal interossei  5/5   Dorsal interossei  5/5   Abductor pollicis  5/5   Abductor pollicis  5/5   Tone (Ashworth scale)  0  Tone (Ashworth scale)  0   Right Lower Extremity:    Left Lower Extremity:    Hip flexors  5/5   Hip flexors  5/5   Hip extensors  5/5   Hip extensors  5/5   Knee flexors  5/5   Knee flexors  5/5   Knee extensors  5/5   Knee extensors  5/5   Dorsiflexors  5/5   Dorsiflexors  5/5   Plantarflexors  5/5   Plantarflexors  5/5   Toe extensors  5/5   Toe extensors  5/5   Toe flexors  5/5   Toe flexors  5/5   Tone (Ashworth scale)  0  Tone (Ashworth scale)  0   MSRs:  Right                                                                 Left brachioradialis 2+  brachioradialis 2+  biceps 2+  biceps 2+  triceps 2+  triceps 2+  patellar 2+  patellar 2+  ankle jerk 2+  ankle jerk 2+  Hoffman no  Hoffman no  plantar response down  plantar response down   SENSORY:  Normal and symmetric perception of light touch, pinprick, vibration, and proprioception.  Romberg's sign absent.   COORDINATION/GAIT: Normal finger-to- nose-finger and heel-to-shin.  Intact rapid alternating movements bilaterally.  Able to rise from a  chair without using arms.  Gait narrow based and stable. Tandem and stressed gait intact.    IMPRESSION: Mrs. Leh is a 46 year-old female referred for evaluation of migratory and sporadic paresthesias of the arms, legs, and trunk.  Her exam is entirely normal and non-focal.  Her symptoms do not conform to any cutaneous nerve of dermatomal pattern and are too sporadic to represent primary neurological condition.  Certainly, topiramate can cause paresthesias, but this was started 6 months ago and symptoms have been  ongoing for at least two years.  She was reassured that I did not see anything worrisome on her exam and may be related to underlying fibromyalgia or stress reaction, especially as this is a identifiable trigger.  To be complete, I will vitamin B12, MMA, copper, and folate to be sure secondary cause for parethesias is not missed.  I recommend a trial off topiramate as this has known side effects of paresthesias.  We discussed ancillary testing such as NCS/EMG and this was declined by patient as the pretest probably of finding a nerve pathology is very low.    Return to clinic as needed  The duration of this appointment visit was 35 minutes of face-to-face time with the patient.  Greater than 50% of this time was spent in counseling, explanation of diagnosis, planning of further management, and coordination of care.   Thank you for allowing me to participate in patient's care.  If I can answer any additional questions, I would be pleased to do so.    Sincerely,    Donika K. Allena Katz, DO

## 2017-05-18 LAB — FOLATE: Folate: 12.8 ng/mL (ref >5.4)

## 2017-05-18 LAB — VITAMIN B12: Vitamin B-12: 416 pg/mL (ref 200–1100)

## 2017-05-19 LAB — METHYLMALONIC ACID, SERUM: METHYLMALONIC ACID, QUANT: 163 nmol/L (ref 87–318)

## 2017-05-20 LAB — COPPER, SERUM: COPPER: 154 ug/dL (ref 70–175)

## 2017-05-22 DIAGNOSIS — Z7189 Other specified counseling: Secondary | ICD-10-CM | POA: Diagnosis not present

## 2017-05-23 DIAGNOSIS — Z713 Dietary counseling and surveillance: Secondary | ICD-10-CM | POA: Diagnosis not present

## 2017-05-24 DIAGNOSIS — M797 Fibromyalgia: Secondary | ICD-10-CM | POA: Diagnosis not present

## 2017-06-07 ENCOUNTER — Telehealth: Payer: Self-pay | Admitting: Neurology

## 2017-06-07 NOTE — Telephone Encounter (Signed)
Patient wants lab results please

## 2017-06-09 NOTE — Telephone Encounter (Signed)
Patient notified

## 2017-06-09 NOTE — Telephone Encounter (Signed)
Please notify patient lab are within normal limits.  Thank you.  

## 2017-06-09 NOTE — Telephone Encounter (Signed)
Please advise 

## 2017-06-13 DIAGNOSIS — M797 Fibromyalgia: Secondary | ICD-10-CM | POA: Diagnosis not present

## 2017-06-21 DIAGNOSIS — M797 Fibromyalgia: Secondary | ICD-10-CM | POA: Diagnosis not present

## 2017-06-22 DIAGNOSIS — M25512 Pain in left shoulder: Secondary | ICD-10-CM | POA: Diagnosis not present

## 2017-06-22 DIAGNOSIS — M542 Cervicalgia: Secondary | ICD-10-CM | POA: Diagnosis not present

## 2017-06-22 DIAGNOSIS — M545 Low back pain: Secondary | ICD-10-CM | POA: Diagnosis not present

## 2017-06-23 DIAGNOSIS — R350 Frequency of micturition: Secondary | ICD-10-CM | POA: Diagnosis not present

## 2017-06-26 DIAGNOSIS — M545 Low back pain: Secondary | ICD-10-CM | POA: Diagnosis not present

## 2017-06-26 DIAGNOSIS — M542 Cervicalgia: Secondary | ICD-10-CM | POA: Diagnosis not present

## 2017-06-27 DIAGNOSIS — M545 Low back pain: Secondary | ICD-10-CM | POA: Diagnosis not present

## 2017-06-27 DIAGNOSIS — M542 Cervicalgia: Secondary | ICD-10-CM | POA: Diagnosis not present

## 2017-07-03 DIAGNOSIS — M545 Low back pain: Secondary | ICD-10-CM | POA: Diagnosis not present

## 2017-07-03 DIAGNOSIS — M542 Cervicalgia: Secondary | ICD-10-CM | POA: Diagnosis not present

## 2017-07-04 DIAGNOSIS — M545 Low back pain: Secondary | ICD-10-CM | POA: Diagnosis not present

## 2017-07-04 DIAGNOSIS — M542 Cervicalgia: Secondary | ICD-10-CM | POA: Diagnosis not present

## 2017-07-06 DIAGNOSIS — M542 Cervicalgia: Secondary | ICD-10-CM | POA: Diagnosis not present

## 2017-07-06 DIAGNOSIS — M545 Low back pain: Secondary | ICD-10-CM | POA: Diagnosis not present

## 2017-07-07 DIAGNOSIS — M797 Fibromyalgia: Secondary | ICD-10-CM | POA: Diagnosis not present

## 2017-07-07 DIAGNOSIS — M542 Cervicalgia: Secondary | ICD-10-CM | POA: Diagnosis not present

## 2017-07-07 DIAGNOSIS — M545 Low back pain: Secondary | ICD-10-CM | POA: Diagnosis not present

## 2017-07-10 DIAGNOSIS — M542 Cervicalgia: Secondary | ICD-10-CM | POA: Diagnosis not present

## 2017-07-10 DIAGNOSIS — M545 Low back pain: Secondary | ICD-10-CM | POA: Diagnosis not present

## 2017-07-13 DIAGNOSIS — M545 Low back pain: Secondary | ICD-10-CM | POA: Diagnosis not present

## 2017-07-13 DIAGNOSIS — M542 Cervicalgia: Secondary | ICD-10-CM | POA: Diagnosis not present

## 2017-07-17 ENCOUNTER — Ambulatory Visit: Payer: 59 | Admitting: Neurology

## 2017-07-17 DIAGNOSIS — M542 Cervicalgia: Secondary | ICD-10-CM | POA: Diagnosis not present

## 2017-07-17 DIAGNOSIS — M545 Low back pain: Secondary | ICD-10-CM | POA: Diagnosis not present

## 2017-07-18 DIAGNOSIS — Z1231 Encounter for screening mammogram for malignant neoplasm of breast: Secondary | ICD-10-CM | POA: Diagnosis not present

## 2017-07-18 DIAGNOSIS — Z01419 Encounter for gynecological examination (general) (routine) without abnormal findings: Secondary | ICD-10-CM | POA: Diagnosis not present

## 2017-07-20 DIAGNOSIS — M545 Low back pain: Secondary | ICD-10-CM | POA: Diagnosis not present

## 2017-07-20 DIAGNOSIS — M542 Cervicalgia: Secondary | ICD-10-CM | POA: Diagnosis not present

## 2017-07-21 DIAGNOSIS — M545 Low back pain: Secondary | ICD-10-CM | POA: Diagnosis not present

## 2017-07-21 DIAGNOSIS — M542 Cervicalgia: Secondary | ICD-10-CM | POA: Diagnosis not present

## 2017-07-24 DIAGNOSIS — M545 Low back pain: Secondary | ICD-10-CM | POA: Diagnosis not present

## 2017-07-24 DIAGNOSIS — M542 Cervicalgia: Secondary | ICD-10-CM | POA: Diagnosis not present

## 2017-07-25 DIAGNOSIS — M545 Low back pain: Secondary | ICD-10-CM | POA: Diagnosis not present

## 2017-07-25 DIAGNOSIS — M542 Cervicalgia: Secondary | ICD-10-CM | POA: Diagnosis not present

## 2017-07-26 DIAGNOSIS — M545 Low back pain: Secondary | ICD-10-CM | POA: Diagnosis not present

## 2017-07-26 DIAGNOSIS — M79644 Pain in right finger(s): Secondary | ICD-10-CM | POA: Diagnosis not present

## 2017-07-26 DIAGNOSIS — M542 Cervicalgia: Secondary | ICD-10-CM | POA: Diagnosis not present

## 2017-08-01 DIAGNOSIS — M542 Cervicalgia: Secondary | ICD-10-CM | POA: Diagnosis not present

## 2017-08-01 DIAGNOSIS — M545 Low back pain: Secondary | ICD-10-CM | POA: Diagnosis not present

## 2017-08-03 DIAGNOSIS — M542 Cervicalgia: Secondary | ICD-10-CM | POA: Diagnosis not present

## 2017-08-03 DIAGNOSIS — M545 Low back pain: Secondary | ICD-10-CM | POA: Diagnosis not present

## 2017-08-07 DIAGNOSIS — M542 Cervicalgia: Secondary | ICD-10-CM | POA: Diagnosis not present

## 2017-08-07 DIAGNOSIS — M545 Low back pain: Secondary | ICD-10-CM | POA: Diagnosis not present

## 2017-08-09 DIAGNOSIS — M545 Low back pain: Secondary | ICD-10-CM | POA: Diagnosis not present

## 2017-08-09 DIAGNOSIS — M542 Cervicalgia: Secondary | ICD-10-CM | POA: Diagnosis not present

## 2017-08-18 DIAGNOSIS — E079 Disorder of thyroid, unspecified: Secondary | ICD-10-CM | POA: Diagnosis not present

## 2017-08-21 DIAGNOSIS — M542 Cervicalgia: Secondary | ICD-10-CM | POA: Diagnosis not present

## 2017-08-21 DIAGNOSIS — M545 Low back pain: Secondary | ICD-10-CM | POA: Diagnosis not present

## 2017-08-22 DIAGNOSIS — Z13220 Encounter for screening for lipoid disorders: Secondary | ICD-10-CM | POA: Diagnosis not present

## 2017-08-22 DIAGNOSIS — Z131 Encounter for screening for diabetes mellitus: Secondary | ICD-10-CM | POA: Diagnosis not present

## 2017-08-24 DIAGNOSIS — R350 Frequency of micturition: Secondary | ICD-10-CM | POA: Diagnosis not present

## 2017-08-29 DIAGNOSIS — M542 Cervicalgia: Secondary | ICD-10-CM | POA: Diagnosis not present

## 2017-08-29 DIAGNOSIS — M545 Low back pain: Secondary | ICD-10-CM | POA: Diagnosis not present

## 2017-08-30 DIAGNOSIS — M542 Cervicalgia: Secondary | ICD-10-CM | POA: Diagnosis not present

## 2017-08-30 DIAGNOSIS — M545 Low back pain: Secondary | ICD-10-CM | POA: Diagnosis not present

## 2017-09-12 DIAGNOSIS — Z23 Encounter for immunization: Secondary | ICD-10-CM | POA: Diagnosis not present

## 2017-09-12 DIAGNOSIS — Z7189 Other specified counseling: Secondary | ICD-10-CM | POA: Diagnosis not present

## 2017-09-14 DIAGNOSIS — M545 Low back pain: Secondary | ICD-10-CM | POA: Diagnosis not present

## 2017-09-14 DIAGNOSIS — M542 Cervicalgia: Secondary | ICD-10-CM | POA: Diagnosis not present

## 2017-09-21 DIAGNOSIS — M170 Bilateral primary osteoarthritis of knee: Secondary | ICD-10-CM | POA: Diagnosis not present

## 2017-09-21 DIAGNOSIS — M797 Fibromyalgia: Secondary | ICD-10-CM | POA: Diagnosis not present

## 2017-09-27 DIAGNOSIS — M545 Low back pain: Secondary | ICD-10-CM | POA: Diagnosis not present

## 2017-09-27 DIAGNOSIS — M542 Cervicalgia: Secondary | ICD-10-CM | POA: Diagnosis not present

## 2017-09-28 DIAGNOSIS — M542 Cervicalgia: Secondary | ICD-10-CM | POA: Diagnosis not present

## 2017-09-28 DIAGNOSIS — M545 Low back pain: Secondary | ICD-10-CM | POA: Diagnosis not present

## 2017-09-29 DIAGNOSIS — E669 Obesity, unspecified: Secondary | ICD-10-CM | POA: Insufficient documentation

## 2017-10-02 DIAGNOSIS — M542 Cervicalgia: Secondary | ICD-10-CM | POA: Diagnosis not present

## 2017-10-02 DIAGNOSIS — M545 Low back pain: Secondary | ICD-10-CM | POA: Diagnosis not present

## 2017-10-03 DIAGNOSIS — M542 Cervicalgia: Secondary | ICD-10-CM | POA: Diagnosis not present

## 2017-10-03 DIAGNOSIS — M545 Low back pain: Secondary | ICD-10-CM | POA: Diagnosis not present

## 2017-10-17 DIAGNOSIS — F410 Panic disorder [episodic paroxysmal anxiety] without agoraphobia: Secondary | ICD-10-CM

## 2017-10-17 DIAGNOSIS — F4310 Post-traumatic stress disorder, unspecified: Secondary | ICD-10-CM

## 2017-10-17 HISTORY — DX: Post-traumatic stress disorder, unspecified: F43.10

## 2017-10-17 HISTORY — DX: Panic disorder (episodic paroxysmal anxiety): F41.0

## 2017-11-01 DIAGNOSIS — M797 Fibromyalgia: Secondary | ICD-10-CM | POA: Diagnosis not present

## 2017-11-06 DIAGNOSIS — M797 Fibromyalgia: Secondary | ICD-10-CM | POA: Diagnosis not present

## 2017-11-15 ENCOUNTER — Emergency Department (HOSPITAL_COMMUNITY)
Admission: EM | Admit: 2017-11-15 | Discharge: 2017-11-15 | Discharge status: Against Medical Advice | Payer: 59 | Attending: Emergency Medicine | Admitting: Emergency Medicine

## 2017-11-15 ENCOUNTER — Other Ambulatory Visit: Payer: Self-pay

## 2017-11-15 ENCOUNTER — Encounter (HOSPITAL_COMMUNITY): Payer: Self-pay | Admitting: *Deleted

## 2017-11-15 ENCOUNTER — Emergency Department (HOSPITAL_COMMUNITY): Payer: 59

## 2017-11-15 DIAGNOSIS — R079 Chest pain, unspecified: Secondary | ICD-10-CM | POA: Diagnosis not present

## 2017-11-15 DIAGNOSIS — R110 Nausea: Secondary | ICD-10-CM | POA: Diagnosis not present

## 2017-11-15 DIAGNOSIS — R51 Headache: Secondary | ICD-10-CM | POA: Insufficient documentation

## 2017-11-15 DIAGNOSIS — Z79899 Other long term (current) drug therapy: Secondary | ICD-10-CM | POA: Diagnosis not present

## 2017-11-15 DIAGNOSIS — R05 Cough: Secondary | ICD-10-CM | POA: Insufficient documentation

## 2017-11-15 DIAGNOSIS — R000 Tachycardia, unspecified: Secondary | ICD-10-CM

## 2017-11-15 DIAGNOSIS — M25512 Pain in left shoulder: Secondary | ICD-10-CM | POA: Insufficient documentation

## 2017-11-15 DIAGNOSIS — R1030 Lower abdominal pain, unspecified: Secondary | ICD-10-CM | POA: Diagnosis not present

## 2017-11-15 DIAGNOSIS — R251 Tremor, unspecified: Secondary | ICD-10-CM | POA: Diagnosis not present

## 2017-11-15 DIAGNOSIS — R0602 Shortness of breath: Secondary | ICD-10-CM | POA: Diagnosis not present

## 2017-11-15 DIAGNOSIS — Z5329 Procedure and treatment not carried out because of patient's decision for other reasons: Secondary | ICD-10-CM | POA: Diagnosis not present

## 2017-11-15 DIAGNOSIS — R0789 Other chest pain: Secondary | ICD-10-CM | POA: Diagnosis not present

## 2017-11-15 LAB — BASIC METABOLIC PANEL
Anion gap: 14 (ref 5–15)
BUN: 10 mg/dL (ref 6–20)
CHLORIDE: 101 mmol/L (ref 101–111)
CO2: 22 mmol/L (ref 22–32)
CREATININE: 1.06 mg/dL — AB (ref 0.44–1.00)
Calcium: 9.3 mg/dL (ref 8.9–10.3)
GFR calc non Af Amer: >60 mL/min (ref >60)
Glucose, Bld: 111 mg/dL — ABNORMAL HIGH (ref 65–99)
POTASSIUM: 3.1 mmol/L — AB (ref 3.5–5.1)
Sodium: 137 mmol/L (ref 135–145)

## 2017-11-15 LAB — I-STAT TROPONIN, ED
TROPONIN I, POC: 0 ng/mL (ref 0.00–0.08)
Troponin i, poc: 0 ng/mL (ref 0.00–0.08)

## 2017-11-15 LAB — CBC
HEMATOCRIT: 44.2 % (ref 36.0–46.0)
HEMOGLOBIN: 14.8 g/dL (ref 12.0–15.0)
MCH: 30.1 pg (ref 26.0–34.0)
MCHC: 33.5 g/dL (ref 30.0–36.0)
MCV: 89.8 fL (ref 78.0–100.0)
PLATELETS: 306 10*3/uL (ref 150–400)
RBC: 4.92 MIL/uL (ref 3.87–5.11)
RDW: 12.6 % (ref 11.5–15.5)
WBC: 11.2 10*3/uL — ABNORMAL HIGH (ref 4.0–10.5)

## 2017-11-15 LAB — D-DIMER, QUANTITATIVE (NOT AT ARMC): D DIMER QUANT: 0.31 ug{FEU}/mL (ref 0.00–0.50)

## 2017-11-15 NOTE — ED Notes (Signed)
Pt gone from room before signature could be obtained

## 2017-11-15 NOTE — ED Triage Notes (Signed)
Pt reports having onset today of mid chest tightness. Has palpitations and mild sob. Mild left shoulder pain today and belching.  Also had recent cough. ekg done, no acute distress is noted at triage.

## 2017-11-15 NOTE — ED Notes (Signed)
Pt requesting to go home and follow up with doctor tomorrow.

## 2017-11-15 NOTE — ED Provider Notes (Signed)
[facility] EMERGENCY DEPARTMENT Provider Note   CSN: [id number] Arrival date & time: 11/15/17  1815     History   Chief Complaint Chief Complaint  Patient presents with  . Chest Pain    HPI [name] is a 47 y.o. female.  HPI Pt has a history of fibromyalgia.  Today she started having tightness in the chest around lunchtime.  The pain increased.  It went to her back.  She continued to feel nauseated.  She then had pain in her left shoulder and left groin.  She then had episodes of her hands and feet twitching.  Her feet started to get cold.  She has noticed white dots in her vision and she has noticed a slight headache.  Past Medical History:  Diagnosis Date  . ADD (attention deficit disorder)   . Anxiety   . Arthropathy, unspecified, site unspecified   . Chronic fatigue syndrome   . Depression   . Disc degeneration   . Disturbance of skin sensation   . Fibromyalgia   . History of kidney stones   . Sleep disorder 01/08/2014  . Sleep disturbance, unspecified   . Unspecified nonpsychotic mental disorder     Patient Active Problem List   Diagnosis Date Noted  . Sleep disorder 01/08/2014  . Fibroids 10/16/2013    Past Surgical History:  Procedure Laterality Date  . ABDOMINAL HYSTERECTOMY    . BILATERAL SALPINGECTOMY Bilateral 10/16/2013   Procedure: BILATERAL SALPINGECTOMY;  Surgeon: Lovenia Kim, MD;  Location: [facility];  Service: Gynecology;  Laterality: Bilateral;  . CESAREAN SECTION     x2  . CHOLECYSTECTOMY    . DIAGNOSTIC LAPAROSCOPY    . MANDIBLE SURGERY    . ROBOTIC ASSISTED TOTAL HYSTERECTOMY N/A 10/16/2013   Procedure: ROBOTIC ASSISTED TOTAL HYSTERECTOMY;  Surgeon: Lovenia Kim, MD;  Location: [facility];  Service: Gynecology;  Laterality: N/A;    OB History    No data available       Home Medications    Prior to Admission medications   Medication Sig Start Date End Date Taking? Authorizing Provider  ALPRAZolam Duanne Moron)  1 MG tablet Take 1 mg by mouth at bedtime as needed for anxiety or sleep.   Yes [provider]  buPROPion (WELLBUTRIN XL) 150 MG 24 hr tablet Take 150 mg by mouth daily. 10/30/17  Yes [provider]  busPIRone (BUSPAR) 5 MG tablet Take 5 mg by mouth daily. 09/05/17  Yes [provider]  DULoxetine (CYMBALTA) 60 MG capsule Take 60 mg by mouth daily.    Yes [provider]  furosemide (LASIX) 40 MG tablet Take 40 mg by mouth daily as needed.   Yes [provider]  SAXENDA 18 MG/3ML SOPN Inject 1.8 mg into the skin daily. 09/30/17  Yes [provider]  Vitamin D, Ergocalciferol, (DRISDOL) 50000 units CAPS capsule Take 50,000 Units by mouth daily.    Yes [provider]    Family History Family History  Problem Relation Age of Onset  . Heart attack Mother   . Hypertension Mother   . Neuropathy Father   . Diabetes Father   . Cancer Paternal Grandfather   . Stroke Maternal Grandmother     Social History Social History   Tobacco Use  . Smoking status: Never Smoker  . Smokeless tobacco: Never Used  Substance Use Topics  . Alcohol use: Yes    Comment: socially, once per month  . Drug use: No  Allergies   Mirabegron; Lyrica [pregabalin]; Sulfa antibiotics; and Elavil [amitriptyline hcl]   Review of Systems Review of Systems  Constitutional: Negative for fever.       Felt hot last night  Respiratory: Positive for cough.   Gastrointestinal:       Bloating, gas, belching a lot the last two weeks     Physical Exam Updated Vital Signs BP (!) 137/93   Pulse 96   Temp 98.4 F (36.9 C) (Oral)   Resp 16   Ht 1.6 m (5\' 3" )   Wt 90.3 kg (199 lb)   LMP 10/05/2013   SpO2 99%   BMI 35.25 kg/m   Physical Exam  Constitutional: She appears well-developed and well-nourished. No distress.  HENT:  Head: Normocephalic and atraumatic.  Right Ear: External ear normal.  Left Ear: External ear normal.  Eyes:  Conjunctivae are normal. Right eye exhibits no discharge. Left eye exhibits no discharge. No scleral icterus.  Neck: Neck supple. No tracheal deviation present.  Cardiovascular: Normal rate, regular rhythm and intact distal pulses.  Pulmonary/Chest: Effort normal and breath sounds normal. No stridor. No respiratory distress. She has no wheezes. She has no rales.  Abdominal: Soft. Bowel sounds are normal. She exhibits no distension. There is no tenderness. There is no rebound and no guarding.  Musculoskeletal: She exhibits no edema or tenderness.  Neurological: She is alert. She has normal strength. No cranial nerve deficit (no facial droop, extraocular movements intact, no slurred speech) or sensory deficit. She exhibits normal muscle tone. She displays no seizure activity. Coordination normal.  Skin: Skin is warm and dry. No rash noted.  Psychiatric: She has a normal mood and affect.  Nursing note and vitals reviewed.    ED Treatments / Results  Labs (all labs ordered are listed, but only abnormal results are displayed) Labs Reviewed  BASIC METABOLIC PANEL - Abnormal; Notable for the following components:      Result Value   Potassium 3.1 (*)    Glucose, Bld 111 (*)    Creatinine, Ser 1.06 (*)    All other components within normal limits  CBC - Abnormal; Notable for the following components:   WBC 11.2 (*)    All other components within normal limits  D-DIMER, QUANTITATIVE (NOT AT [facility])  I-STAT TROPONIN, ED  I-STAT TROPONIN, ED    EKG  EKG Interpretation  Date/Time:  Wednesday November 15 2017 18:21:29 EST Ventricular Rate:  129 PR Interval:  148 QRS Duration: 80 QT Interval:  296 QTC Calculation: 433 R Axis:   57 Text Interpretation:  Sinus tachycardia Anterior infarct , age undetermined ST & T wave abnormality, consider inferior ischemia , new since last tracing Abnormal ECG Since last tracing rate faster Confirmed by Dorie Rank [phone]) on 11/15/2017 9:43:26 PM        Radiology Dg Chest 2 View  Result Date: 11/15/2017 CLINICAL DATA:  Mid to left chest pain with right jaw pain last night. Today pain radiates into left shoulder and between shoulder blades EXAM: CHEST  2 VIEW COMPARISON:  Chest x-ray dated 01/11/2016. FINDINGS: Cardiomediastinal silhouette is normal in size and configuration. Lungs are clear. Lung volumes are normal. No evidence of pneumonia. No pleural effusion. No pneumothorax seen. Osseous and soft tissue structures about the chest are unremarkable. IMPRESSION: No active cardiopulmonary disease. Electronically Signed   By: Franki Cabot M.D.   On: 11/15/2017 19:03    Procedures Procedures (including critical care time)  Medications Ordered in ED Medications -  No data to display   Initial Impression / Assessment and Plan / ED Course  I have reviewed the triage vital signs and the nursing notes.  Pertinent labs & imaging results that were available during my care of the patient were reviewed by me and considered in my medical decision making (see chart for details).   Patient presented to the emergency room for evaluation of chest pain and shortness of breath.  Patient does not have history of heart disease but there is a family history.  She is overall low risk heart score and her symptoms are atypical.  Initial troponin was normal.  No signs of pneumonia on x-ray.  She did have some EKG changes associated with her tachycardia but possibly rate related.  Patient's pulse rate on my exam was in the 80s.  However she did clearly have tachycardia earlier as demonstrated on her EKG.  I added on a d-dimer test.  I explained to the patient that I wanted to repeat her troponin and check on the d-dimer.  I initially saw the patient at 9:38 PM.  Patient informed us at 10:38 PM that she was ready to leave.  I explained her that we did not have her test results back.  I would prefer to make sure her d-dimer and troponin are normal.  Patient was  adamant that she wanted to go home.  She said she would follow-up with her primary doctor.  I explained her that if her d-dimer or troponin were elevated we would likely recommend additional testing and possible hospital admission.  Patient understands and she is going to leave AMA.  Final Clinical Impressions(s) / ED Diagnoses   Final diagnoses:  Chest pain, unspecified type  Tachycardia    ED Discharge Orders    None       Dorie Rank, MD 11/15/17 2238

## 2017-11-15 NOTE — ED Notes (Signed)
Patient voiced frustrations/upset with nurse due to long wait at initial encounter , nurse explained delay /high census to pt. but not satisfied .

## 2017-11-30 NOTE — Progress Notes (Signed)
Follow-up Visit   Date: 12/01/17    [name] MRN: [id number] DOB: 04/01/1971   Interim History: [name] is a 47 y.o. right-handed Caucasian female with ADD, depression, bipolar disorder, fibromyalgia, chronic low back pain, and hypothyroidism returning to the clinic with new complaints of memory changes and slurred speech.  The patient was accompanied to the clinic by sister who also provides collateral information.    History of present illness: Starting around 2016, she has spells of shooting pain going through the chest, legs, and arms.  She complains of "heat" sensation of the arms and legs.  She has pin and needles of the soles of the feet and palms of the hands.  She was diagnosed with fibromyalgia around the same time. She was tried on gabapentin and Lyrica, which did not help.She currently takes Cymbalta 20mg  for mood and fibromyalgia written by her psychiatrist. These symptoms are intermittent and occurs about once per month, lasting 2 minutes. Stress always triggers these spells and recalls having worsening pain when her mother passed away last year.  She also complains of generalized fatigue and physically exhausted.  She goes to physical therapy and acupucture which is helping her stress management.  She takes topiramate 100mg  for weight loss for the past 6 months.    UPDATE 12/01/2017:  At her last visit, I suggested that she try stopping topiramate to see if her paresthesias would improve. She has discontinued this medication, but still gets sharp shooting pain throughout her body.  Today she is here for new episodes of slurred speech and memory loss.  She went to the ER for chest pain in January and left AMA.  Several days following this, she had 3-4 days of slurred speech and forgetfulness.  Speech was slurred, stuttering, and she was unable to get the words out and constant for 3-4 days.  Speech spontaneously improved, but she continues to have spells about once per  week where this still happens.  She also states that for a few days, she was unable to recall any details of her day and activities.  She was aware that she went to work, but does not know what she did.  She denies any lateralizing weakness or paresthesias. She was asked to follow-up with me because of concern of TIA.   She also complains of generalized muscle jerks of the arms and legs.  Her mood is worse than before due to personal stressors (she has been laid off work, she can't tolerate medications and unable to find something that is effective).   Medications:  Current Outpatient Medications on File Prior to Visit  Medication Sig Dispense Refill  . ALPRAZolam (XANAX) 1 MG tablet Take 1 mg by mouth at bedtime as needed for anxiety or sleep.    . busPIRone (BUSPAR) 5 MG tablet Take 5 mg by mouth daily.  3  . DULoxetine (CYMBALTA) 60 MG capsule Take 60 mg by mouth daily.     . furosemide (LASIX) 40 MG tablet Take 40 mg by mouth daily as needed.    Marland Kitchen SAXENDA 18 MG/3ML SOPN Inject 1.8 mg into the skin daily.  1  . Vitamin D, Ergocalciferol, (DRISDOL) 50000 units CAPS capsule Take 50,000 Units by mouth daily.      No current facility-administered medications on file prior to visit.     Allergies:  Allergies  Allergen Reactions  . Mirabegron Swelling  . Lyrica [Pregabalin]   . Sulfa Antibiotics Hives  All over body  . Elavil [Amitriptyline Hcl] Anxiety    Review of Systems:  CONSTITUTIONAL: No fevers, chills, night sweats, or weight loss.  EYES: No visual changes or eye pain ENT: No hearing changes.  No history of nose bleeds.   RESPIRATORY: No cough, wheezing and shortness of breath.   CARDIOVASCULAR: Negative for chest pain, and palpitations.   GI: Negative for abdominal discomfort, blood in stools or black stools.  No recent change in bowel habits.   GU:  No history of incontinence.   MUSCLOSKELETAL: No history of joint pain or swelling.  No myalgias.   SKIN: Negative for  lesions, rash, and itching.   ENDOCRINE: Negative for cold or heat intolerance, polydipsia or goiter.   PSYCH:  + depression +anxiety symptoms.   NEURO: As Above.   Vital Signs:  BP 110/74   Pulse 96   Ht 5\' 3"  (1.6 m)   Wt 193 lb (87.5 kg)   LMP 10/05/2013   SpO2 98%   BMI 34.19 kg/m  Montreal Cognitive Assessment  12/01/2017  Visuospatial/ Executive (0/5) 4  Naming (0/3) 3  Attention: Read list of digits (0/2) 2  Attention: Read list of letters (0/1) 1  Attention: Serial 7 subtraction starting at 100 (0/3) 0  Language: Repeat phrase (0/2) 0  Language : Fluency (0/1) 1  Abstraction (0/2) 2  Delayed Recall (0/5) 1  Orientation (0/6) 6  Total 20  Adjusted Score (based on education) 20  **Poor effort on MOCA assessment  General: Anxious-appearing, comfortable Neck: No carotid bruit CV: regular rate and rhythm Ext: No edema  Neurological Exam: MENTAL STATUS including orientation to time, place, person, recent and remote memory, attention span and concentration, language, and fund of knowledge is normal.  Speech is not dysarthric, there is no stuttering, naming and repetition intact.  CRANIAL NERVES: Normal fundoscopic exam.  No visual field defects.  Pupils equal round and reactive to light.  Normal conjugate, extra-ocular eye movements in all directions of gaze.  No ptosis. Normal facial sensation.  Face is symmetric. Palate elevates symmetrically.  Tongue is midline.  MOTOR:  Motor strength is 5/5 in all extremities.  No atrophy, fasciculations or abnormal movements.  No pronator drift.  Tone is normal.    MSRs:  Reflexes are 2+/4 throughout.  SENSORY:  Intact to vibration, temperature, and pin prick throughout.  COORDINATION/GAIT:  Normal finger-to- nose-finger and heel-to-shin.  Intact rapid alternating movements bilaterally.  Gait narrow based and stable.   Data: Labs 05/17/2017:  Vitamin B 416, folate 12.8, copper 154, MMA 163  IMPRESSION/PLAN: [name] is a 47  year-old female referred for evaluation of new complaints of slurred speech and memory loss.  She reports having dysarthria, stuttering, and aphasia for 3-4 days and continues to have this once per week.  Her speech today is normal, and neurological exam is also normal.  I reassured patient that with normal exam, it is very unlikely that she had a stroke as symptoms with cerebral infarction leave residual deficits, especially if symptoms were ongoing for several days.  Because she also complains of memory loss, generalized paresthesias, in addition to speech changes, I will order MRI brain wo contrast to exclude any intracranial pathology, but my overall suspicion is low.  Her cognitive testing shows very poor effort, missing points in all the domains suggesting that this is moreso related to underlying mood disorder.  She does endorse being under a great deal of stress lately especially after being laid off  from work and we again addressed that stress reaction can manifest with various symptoms which she understands.  She continues to work closely with her psychiatrist and counselor.  Thank you for allowing me to participate in patient's care.  If I can answer any additional questions, I would be pleased to do so.    Sincerely,    Anjulie Dipierro K. Allena Katz, DO

## 2017-12-01 ENCOUNTER — Encounter: Payer: Self-pay | Admitting: Neurology

## 2017-12-01 ENCOUNTER — Ambulatory Visit (INDEPENDENT_AMBULATORY_CARE_PROVIDER_SITE_OTHER): Payer: 59 | Admitting: Neurology

## 2017-12-01 VITALS — BP 110/74 | HR 96 | Ht 63.0 in | Wt 193.0 lb

## 2017-12-01 DIAGNOSIS — R4781 Slurred speech: Secondary | ICD-10-CM

## 2017-12-01 DIAGNOSIS — R413 Other amnesia: Secondary | ICD-10-CM

## 2017-12-01 NOTE — Patient Instructions (Signed)
MRI brain without contrast

## 2017-12-07 DIAGNOSIS — M797 Fibromyalgia: Secondary | ICD-10-CM | POA: Diagnosis not present

## 2017-12-13 DIAGNOSIS — M797 Fibromyalgia: Secondary | ICD-10-CM | POA: Diagnosis not present

## 2017-12-14 ENCOUNTER — Other Ambulatory Visit: Payer: Self-pay | Admitting: Urology

## 2017-12-14 DIAGNOSIS — H0012 Chalazion right lower eyelid: Secondary | ICD-10-CM | POA: Diagnosis not present

## 2017-12-14 DIAGNOSIS — L919 Hypertrophic disorder of the skin, unspecified: Secondary | ICD-10-CM | POA: Diagnosis not present

## 2017-12-14 DIAGNOSIS — D492 Neoplasm of unspecified behavior of bone, soft tissue, and skin: Secondary | ICD-10-CM | POA: Diagnosis not present

## 2017-12-21 NOTE — Progress Notes (Signed)
11-16-17 (Epic) EKG  11-15-17 (Epic) CXR

## 2017-12-21 NOTE — Patient Instructions (Addendum)
[name]  12/21/2017   Your procedure is scheduled on: 01-09-18   Report to [facility] Main  Entrance  ARRIVE AT 1 AM. Have a seat in the Main Lobby. Please note there is a phone at the [employer]. Please call [phone] on that phone. Someone from Short Stay will come and get you from the Main Lobby and take you to Short Stay.    Call this number if you have problems the morning of surgery [phone]   Remember: Do not eat food or drink liquids :After Midnight.     Take these medicines the morning of surgery with A SIP OF WATER: Duloxetine (Cymbalta), Adderall-XL                                You may not have any metal on your body including hair pins and              piercings  Do not wear jewelry, make-up, lotions, powders or perfumes, deodorant             Do not wear nail polish.  Do not shave  48 hours prior to surgery.                Do not bring valuables to the hospital. [place].  Contacts, dentures or bridgework may not be worn into surgery.      Patients discharged the day of surgery will not be allowed to drive home.  Name and phone number of your driver: [name] [phone]                Please read over the following fact sheets you were given: _____________________________________________________________________             [facility] - Preparing for Surgery Before surgery, you can play an important role.  Because skin is not sterile, your skin needs to be as free of germs as possible.  You can reduce the number of germs on your skin by washing with CHG (chlorahexidine gluconate) soap before surgery.  CHG is an antiseptic cleaner which kills germs and bonds with the skin to continue killing germs even after washing. Please DO NOT use if you have an allergy to CHG or antibacterial soaps.  If your skin becomes reddened/irritated stop using the CHG and inform  your nurse when you arrive at Short Stay. Do not shave (including legs and underarms) for at least 48 hours prior to the first CHG shower.  You may shave your face/neck. Please follow these instructions carefully:  1.  Shower with CHG Soap the night before surgery and the  morning of Surgery.  2.  If you choose to wash your hair, wash your hair first as usual with your  normal  shampoo.  3.  After you shampoo, rinse your hair and body thoroughly to remove the  shampoo.                           4.  Use CHG as you would any other liquid soap.  You can apply chg directly  to the skin and wash  Gently with a scrungie or clean washcloth.  5.  Apply the CHG Soap to your body ONLY FROM THE NECK DOWN.   Do not use on face/ open                           Wound or open sores. Avoid contact with eyes, ears mouth and genitals (private parts).                       Wash face,  Genitals (private parts) with your normal soap.             6.  Wash thoroughly, paying special attention to the area where your surgery  will be performed.  7.  Thoroughly rinse your body with warm water from the neck down.  8.  DO NOT shower/wash with your normal soap after using and rinsing off  the CHG Soap.                9.  Pat yourself dry with a clean towel.            10.  Wear clean pajamas.            11.  Place clean sheets on your bed the night of your first shower and do not  sleep with pets. Day of Surgery : Do not apply any lotions/deodorants the morning of surgery.  Please wear clean clothes to the hospital/surgery center.  FAILURE TO FOLLOW THESE INSTRUCTIONS MAY RESULT IN THE CANCELLATION OF YOUR SURGERY PATIENT SIGNATURE_________________________________  NURSE SIGNATURE__________________________________  ________________________________________________________________________

## 2017-12-22 ENCOUNTER — Encounter (HOSPITAL_COMMUNITY): Payer: Self-pay

## 2017-12-22 ENCOUNTER — Other Ambulatory Visit: Payer: Self-pay

## 2017-12-22 ENCOUNTER — Encounter (HOSPITAL_COMMUNITY)
Admission: RE | Admit: 2017-12-22 | Discharge: 2017-12-22 | Disposition: A | Discharge status: Home / Self Care | Payer: 59 | Source: Ambulatory Visit | Attending: Urology | Admitting: Urology

## 2017-12-22 DIAGNOSIS — Z01812 Encounter for preprocedural laboratory examination: Secondary | ICD-10-CM | POA: Insufficient documentation

## 2017-12-22 HISTORY — DX: Panic disorder (episodic paroxysmal anxiety): F41.0

## 2017-12-22 HISTORY — DX: Post-traumatic stress disorder, unspecified: F43.10

## 2017-12-22 LAB — BASIC METABOLIC PANEL
Anion gap: 9 (ref 5–15)
BUN: 12 mg/dL (ref 6–20)
CHLORIDE: 106 mmol/L (ref 101–111)
CO2: 26 mmol/L (ref 22–32)
CREATININE: 1.02 mg/dL — AB (ref 0.44–1.00)
Calcium: 9.4 mg/dL (ref 8.9–10.3)
GFR calc Af Amer: >60 mL/min (ref >60)
GFR calc non Af Amer: >60 mL/min (ref >60)
Glucose, Bld: 87 mg/dL (ref 65–99)
Potassium: 4 mmol/L (ref 3.5–5.1)
Sodium: 141 mmol/L (ref 135–145)

## 2017-12-22 LAB — CBC
HEMATOCRIT: 45.1 % (ref 36.0–46.0)
HEMOGLOBIN: 15.3 g/dL — AB (ref 12.0–15.0)
MCH: 30.6 pg (ref 26.0–34.0)
MCHC: 33.9 g/dL (ref 30.0–36.0)
MCV: 90.2 fL (ref 78.0–100.0)
Platelets: 297 10*3/uL (ref 150–400)
RBC: 5 MIL/uL (ref 3.87–5.11)
RDW: 12.8 % (ref 11.5–15.5)
WBC: 7.1 10*3/uL (ref 4.0–10.5)

## 2018-01-08 MED ORDER — GENTAMICIN SULFATE 40 MG/ML IJ SOLN
5.0000 mg/kg | INTRAVENOUS | Status: AC
  Administered 2018-01-09: 332.4 mg via INTRAVENOUS
  Filled 2018-01-08 (×2): qty 8.25

## 2018-01-08 NOTE — H&P (Signed)
The patient had a sling approximately 2 weeks ago. Her incontinence is much better. When she goes from a sitting to standing position she still can't have a little bit of urge associated incontinence. She said it is much better. She is not having stress incontinence. She has a little bit of soreness in the suprapubic area now getting better.   Incisions look good.   On pelvic examination she had a grade 1 hypermobility of bladder neck and negative cough test. Incision is healing well   The patient has done well on Myrbetriq in the past. She had a very overactive bladder prior to surgery. Overall she is doing much better. I would like to reassess her in 10 weeks and hopefully the overactive bladder wall quite down even more. She emptied well today and if she wants to go back on Myrbetriq that is fine   On further discussion the patient reminded me that she did well on the medications but she had a lot of leg swelling on all of them   Because of some lifting at work and a little bit of suprapubic soreness she will be put off work for approximately 28-30 days following surgery then she will return to work Nov 26/18   the patient in the shower last night felt something as if it was the mesh. She tolerated on something perhaps the suture. She still has had the same efficacy with some residual urgency. She was brought in for reassurance and for examination   On pelvic examination the patient had appropriate hypermobility of bladder neck. She had no sling extrusion. Sutures were identified. Incision was intact.   Today  Currently the patient has no stress incontinence. She is not having vaginal pain or discharge. The urgency is greatly improved and she can hold it greatly when out in public. She still feels which she thinks is mesh and her husband felt so was well   On physical examination she had appropriate hypermobility the bladder neck and negative cough test. Unfortunately she had a greater than 1  cm exposure by about 7 mm just to the left of the midline towards the left urethrovesical angle. It was not particularly tender. It did not look inflamed.   Picture drawn. I reviewed her operative note and went very well. She has had a nice functional result unfortunately she has a surprisingly reasonably large exposure.   I went over in detail exploration and covering over of the sling. It does not look infected but I would prefer this approach over excising the exposed area with closure. I recommend this to preserve continence. It does place her at higher risk of re-exposure. intraoperatively if the tissues did not look healthy then I would remove some of the sling. Pros cons and risks and injury to other structures and sequelae discussed. Her tissues did look healthy and I think I could develop flaps to cover over a period   A picture was drawn. Many questions answered. Patient was I believe pleased with our discussions. We talked about rare risks of rejection and infection and the fact that this generally does not occur. Having said that the findings were surprising. they understand that if it really Rhodes then local excision with coverage would be the next choice. Intraoperatively I will make a final decision and she agreed with this.   The patient had remembered her preoperative discussions on extrusion. They understand this is very early. We will do the surgery soon to accommodate her insurance which  ends the end of March     ALLERGIES: Detrol - Swelling Oxybutynin - Swelling Sulfa Drugs - Swelling, Hives Vesicare - Swelling    MEDICATIONS: Cymbalta 30 mg capsule,delayed release  Flexeril TABS 0 Oral  Klonopin  Saxenda  Trazodone Hcl  Xanax TABS 0 Oral     GU PSH: Complex cystometrogram, w/ void pressure and urethral pressure profile studies, any technique - 11/29/2016 Complex Uroflow - 11/29/2016 Cystoscopy - 12/05/2016 Emg surf Electrd - 11/29/2016 Inject For cystogram -  11/29/2016 Intrabd voidng Press - 11/29/2016 Sling - 08/11/2017      PSH Notes: No Surgical Problems   NON-GU PSH: None   GU PMH: Mixed incontinence - 10/24/2016 Nocturia - 10/24/2016 Urinary Frequency - 10/24/2016 Urinary Tract Inf, Unspec site - 10/24/2016 Pelvic/perineal pain, Female pelvic pain - 2013-01-23 Urinary incontinence, Unspec, Urinary incontinence - 01-23-13      PMH Notes:  1898-10-17 00:00:00 - Note: Normal Routine History And Physical Adult  2012-11-07 17:41:24 - Note: Stress Incontinence  2012-08-20 15:11:19 - Note: Arthritis   NON-GU PMH: Anxiety, Anxiety (Symptom) - Jan 23, 2013 Irritable bowel syndrome with diarrhea, Irritable Bowel Syndrome - 2013-01-23 Muscle weakness (generalized), Muscle weakness - January 23, 2013 Other lack of coordination, Other lack of coordination - Jan 23, 2013 Other muscle spasm, Muscle spasm - 2013/01/23 Personal history of other mental and behavioral disorders, History of depression - 01/23/2013 Unspecified dyspareunia, Dyspareunia, female - 23-Jan-2013    FAMILY HISTORY: Death In The Family Father - [place] Family Death In The Family Mother - [place] is 1 daughter and 1 - [place] _4__ Living Daughter - R[place] In Family nephrolithiasis - Father   SOCIAL HISTORY: Marital Status: Married Does not use smokeless tobacco. Drinks 1 drink per month.  Does not use drugs. Drinks 3 caffeinated drinks per day. Has not had a blood transfusion.     Notes: Caffeine Use, Never A Smoker, Being A Economist, Marital History - Currently Married, Occupation:, Exercise Habits, Living Independently With Spouse, Activities Of Daily Living, Self-reliant In Usual Daily Activities   REVIEW OF SYSTEMS:    GU Review Female:   Patient reports get up at night to urinate and stream starts and stops. Patient denies frequent urination, hard to postpone urination, burning /pain with urination, leakage of urine, trouble starting your stream, have to  strain to urinate, and being pregnant.  Gastrointestinal (Upper):   Patient reports indigestion/ heartburn. Patient denies nausea and vomiting.  Gastrointestinal (Lower):   Patient reports diarrhea and constipation.   Constitutional:   Patient reports fatigue. Patient denies fever, night sweats, and weight loss.  Skin:   Patient denies skin rash/ lesion and itching.  Eyes:   Patient reports blurred vision. Patient denies double vision.  Ears/ Nose/ Throat:   Patient denies sore throat and sinus problems.  Hematologic/Lymphatic:   Patient reports easy bruising. Patient denies swollen glands.  Cardiovascular:   Patient reports leg swelling. Patient denies chest pains.  Respiratory:   Patient reports cough. Patient denies shortness of breath.  Endocrine:   Patient denies excessive thirst.  Musculoskeletal:   Patient reports joint pain and back pain.   Neurological:   Patient reports dizziness. Patient denies headaches.  Psychologic:   Patient reports depression and anxiety.    VITAL SIGNS:      12/12/2017 03:31 PM  BP 123/89 mmHg  Pulse 121 /min  Temperature 97.9 F / 36.6 C   PAST DATA REVIEWED:  Source Of  History:  Patient   PROCEDURES: None   ASSESSMENT:      ICD-10 Details  1 GU:   Mixed incontinence - N39.46   2   Urinary Frequency - R35.0               Notes:   I drew her a picture and we talked about reconstructive surgery in detail. Pros, cons, general surgical and anesthetic risks, and other options including watchful waiting were discussed. She understands that surgery is successful in approximately 85% of cases. Failure rates and the risk of persistence/recurrence/and worsening of the problem were discussed. Surgical risks were described but not limited to the discussion of injury to neighboring structures including the bowel (with possible life-threatening sepsis and colostomy), bladder, urethra, vagina (all resulting in further surgery), and ureter (resulting in  re-implantation). We talked about injury to nerves/soft tissue leading to debilitating and intractable pelvic, abdominal, and lower extremity pain syndromes and neuropathies. The risks of dyspareunia, vaginal narrowing and shortening with sequelae were discussed. The risk of persistent, de novo, or worsening incontinence/dysfunction was discussed. Bleeding risks, transfusion rates, and infection were discussed. The usual post-operative course was described. The patient understands that she might not reach her treatment goal and that she might be worse following surgery.    PLAN:           Document Letter(s):  Created for Patient: Clinical Summary   After a thorough review of the management options for the patient's condition the patient  elected to proceed with surgical therapy as noted above. We have discussed the potential benefits and risks of the procedure, side effects of the proposed treatment, the likelihood of the patient achieving the goals of the procedure, and any potential problems that might occur during the procedure or recuperation. Informed consent has been obtained.

## 2018-01-09 ENCOUNTER — Encounter (HOSPITAL_COMMUNITY)
Admission: RE | Disposition: A | Discharge status: Home / Self Care | Payer: Self-pay | Source: Ambulatory Visit | Attending: Urology

## 2018-01-09 ENCOUNTER — Ambulatory Visit (HOSPITAL_COMMUNITY): Payer: 59 | Admitting: Certified Registered Nurse Anesthetist

## 2018-01-09 ENCOUNTER — Encounter (HOSPITAL_COMMUNITY): Payer: Self-pay | Admitting: *Deleted

## 2018-01-09 ENCOUNTER — Other Ambulatory Visit: Payer: Self-pay

## 2018-01-09 ENCOUNTER — Ambulatory Visit (HOSPITAL_COMMUNITY)
Admission: RE | Admit: 2018-01-09 | Discharge: 2018-01-09 | Disposition: A | Discharge status: Home / Self Care | Payer: 59 | Source: Ambulatory Visit | Attending: Urology | Admitting: Urology

## 2018-01-09 DIAGNOSIS — T83721A Exposure of implanted vaginal mesh and other prosthetic materials into vagina, initial encounter: Secondary | ICD-10-CM | POA: Diagnosis not present

## 2018-01-09 DIAGNOSIS — T83718A Erosion of other implanted mesh and other prosthetic materials to surrounding organ or tissue, initial encounter: Secondary | ICD-10-CM | POA: Diagnosis not present

## 2018-01-09 DIAGNOSIS — N3946 Mixed incontinence: Secondary | ICD-10-CM | POA: Insufficient documentation

## 2018-01-09 DIAGNOSIS — Z79899 Other long term (current) drug therapy: Secondary | ICD-10-CM | POA: Diagnosis not present

## 2018-01-09 DIAGNOSIS — R350 Frequency of micturition: Secondary | ICD-10-CM | POA: Insufficient documentation

## 2018-01-09 DIAGNOSIS — Z466 Encounter for fitting and adjustment of urinary device: Secondary | ICD-10-CM | POA: Diagnosis not present

## 2018-01-09 DIAGNOSIS — Y838 Other surgical procedures as the cause of abnormal reaction of the patient, or of later complication, without mention of misadventure at the time of the procedure: Secondary | ICD-10-CM | POA: Insufficient documentation

## 2018-01-09 HISTORY — PX: REMOVAL OF URINARY SLING: SHX6218

## 2018-01-09 SURGERY — REMOVAL, URETHRAL SLING, FOR STRESS INCONTINENCE
Anesthesia: General

## 2018-01-09 MED ORDER — FENTANYL CITRATE (PF) 100 MCG/2ML IJ SOLN
INTRAMUSCULAR | Status: AC
  Filled 2018-01-09: qty 2

## 2018-01-09 MED ORDER — EPHEDRINE SULFATE-NACL 50-0.9 MG/10ML-% IV SOSY
PREFILLED_SYRINGE | INTRAVENOUS | Status: DC | PRN
  Administered 2018-01-09: 5 mg via INTRAVENOUS
  Administered 2018-01-09 (×3): 10 mg via INTRAVENOUS
  Administered 2018-01-09: 15 mg via INTRAVENOUS

## 2018-01-09 MED ORDER — LACTATED RINGERS IV SOLN
INTRAVENOUS | Status: DC | PRN
  Administered 2018-01-09 (×2): via INTRAVENOUS

## 2018-01-09 MED ORDER — ONDANSETRON HCL 4 MG/2ML IJ SOLN: 4.0000 mg | Freq: Once | INTRAMUSCULAR | Status: DC | PRN

## 2018-01-09 MED ORDER — DEXAMETHASONE SODIUM PHOSPHATE 10 MG/ML IJ SOLN
INTRAMUSCULAR | Status: DC | PRN
  Administered 2018-01-09: 5 mg via INTRAVENOUS

## 2018-01-09 MED ORDER — PROPOFOL 10 MG/ML IV BOLUS
INTRAVENOUS | Status: AC
  Filled 2018-01-09: qty 20

## 2018-01-09 MED ORDER — CLINDAMYCIN PHOSPHATE 2 % VA CREA: 1.0000 | TOPICAL_CREAM | Freq: Every day | VAGINAL | Status: DC

## 2018-01-09 MED ORDER — FENTANYL CITRATE (PF) 100 MCG/2ML IJ SOLN
25.0000 ug | INTRAMUSCULAR | Status: DC | PRN
  Administered 2018-01-09 (×2): 25 ug via INTRAVENOUS

## 2018-01-09 MED ORDER — ESTRADIOL 0.1 MG/GM VA CREA
TOPICAL_CREAM | VAGINAL | Status: AC
  Filled 2018-01-09: qty 42.5

## 2018-01-09 MED ORDER — PHENYLEPHRINE 40 MCG/ML (10ML) SYRINGE FOR IV PUSH (FOR BLOOD PRESSURE SUPPORT)
PREFILLED_SYRINGE | INTRAVENOUS | Status: DC | PRN
  Administered 2018-01-09: 80 ug via INTRAVENOUS

## 2018-01-09 MED ORDER — MIDAZOLAM HCL 2 MG/2ML IJ SOLN
INTRAMUSCULAR | Status: AC
  Filled 2018-01-09: qty 2

## 2018-01-09 MED ORDER — PROPOFOL 10 MG/ML IV BOLUS
INTRAVENOUS | Status: DC | PRN
  Administered 2018-01-09: 180 mg via INTRAVENOUS

## 2018-01-09 MED ORDER — OXYCODONE HCL 5 MG PO TABS
5.0000 mg | ORAL_TABLET | Freq: Once | ORAL | Status: AC | PRN
  Administered 2018-01-09: 5 mg via ORAL

## 2018-01-09 MED ORDER — OXYCODONE HCL 5 MG PO TABS
ORAL_TABLET | ORAL | Status: AC
  Filled 2018-01-09: qty 1

## 2018-01-09 MED ORDER — EPHEDRINE 5 MG/ML INJ
INTRAVENOUS | Status: AC
  Filled 2018-01-09: qty 30

## 2018-01-09 MED ORDER — STERILE WATER FOR IRRIGATION IR SOLN
Status: DC | PRN
  Administered 2018-01-09: 3000 mL via INTRAVESICAL

## 2018-01-09 MED ORDER — HYDROCODONE-ACETAMINOPHEN 5-325 MG PO TABS: 1.0000 | ORAL_TABLET | Freq: Four times a day (QID) | ORAL | 0 refills | Status: DC | PRN

## 2018-01-09 MED ORDER — LIDOCAINE 2% (20 MG/ML) 5 ML SYRINGE
INTRAMUSCULAR | Status: DC | PRN
  Administered 2018-01-09: 60 mg via INTRAVENOUS

## 2018-01-09 MED ORDER — LIDOCAINE-EPINEPHRINE (PF) 1 %-1:200000 IJ SOLN
INTRAMUSCULAR | Status: AC
  Filled 2018-01-09: qty 30

## 2018-01-09 MED ORDER — PHENYLEPHRINE 40 MCG/ML (10ML) SYRINGE FOR IV PUSH (FOR BLOOD PRESSURE SUPPORT)
PREFILLED_SYRINGE | INTRAVENOUS | Status: AC
  Filled 2018-01-09: qty 10

## 2018-01-09 MED ORDER — CEFAZOLIN SODIUM-DEXTROSE 2-4 GM/100ML-% IV SOLN
2.0000 g | INTRAVENOUS | Status: AC
  Administered 2018-01-09: 2 g via INTRAVENOUS
  Filled 2018-01-09: qty 100

## 2018-01-09 MED ORDER — ESTRADIOL 0.1 MG/GM VA CREA
TOPICAL_CREAM | VAGINAL | Status: DC | PRN
  Administered 2018-01-09: 1 via VAGINAL

## 2018-01-09 MED ORDER — OXYCODONE HCL 5 MG/5ML PO SOLN
5.0000 mg | Freq: Once | ORAL | Status: AC | PRN
  Filled 2018-01-09: qty 5

## 2018-01-09 MED ORDER — FENTANYL CITRATE (PF) 100 MCG/2ML IJ SOLN
INTRAMUSCULAR | Status: DC | PRN
  Administered 2018-01-09: 50 ug via INTRAVENOUS

## 2018-01-09 MED ORDER — SODIUM CHLORIDE 0.9 % IV SOLN
INTRAVENOUS | Status: AC
  Filled 2018-01-09: qty 500000

## 2018-01-09 MED ORDER — 0.9 % SODIUM CHLORIDE (POUR BTL) OPTIME
TOPICAL | Status: DC | PRN
  Administered 2018-01-09: 1000 mL

## 2018-01-09 MED ORDER — MIDAZOLAM HCL 5 MG/5ML IJ SOLN
INTRAMUSCULAR | Status: DC | PRN
  Administered 2018-01-09: 2 mg via INTRAVENOUS

## 2018-01-09 MED ORDER — ONDANSETRON HCL 4 MG/2ML IJ SOLN
INTRAMUSCULAR | Status: DC | PRN
  Administered 2018-01-09: 4 mg via INTRAVENOUS

## 2018-01-09 SURGICAL SUPPLY — 54 items
ADH SKN CLS APL DERMABOND .7 (GAUZE/BANDAGES/DRESSINGS) ×2
BAG URINE DRAINAGE (UROLOGICAL SUPPLIES) ×1 IMPLANT
BLADE HEX COATED 2.75 (ELECTRODE) ×2 IMPLANT
BLADE SURG 15 STRL LF DISP TIS (BLADE) ×1 IMPLANT
BLADE SURG 15 STRL SS (BLADE) ×2
BRIEF STRETCH FOR OB PAD LRG (UNDERPADS AND DIAPERS) ×1 IMPLANT
CATH FOLEY 2WAY SLVR  5CC 14FR (CATHETERS) ×1
CATH FOLEY 2WAY SLVR 5CC 14FR (CATHETERS) ×1 IMPLANT
COVER MAYO STAND STRL (DRAPES) ×1 IMPLANT
DECANTER SPIKE VIAL GLASS SM (MISCELLANEOUS) ×2 IMPLANT
DERMABOND ADVANCED (GAUZE/BANDAGES/DRESSINGS) ×2
DERMABOND ADVANCED .7 DNX12 (GAUZE/BANDAGES/DRESSINGS) ×2 IMPLANT
DRAIN PENROSE 18X1/4 LTX STRL (WOUND CARE) ×1 IMPLANT
DRAPE SHEET LG 3/4 BI-LAMINATE (DRAPES) ×2 IMPLANT
ELECT PENCIL ROCKER SW 15FT (MISCELLANEOUS) ×2 IMPLANT
GAUZE PACKING 2X5 YD STRL (GAUZE/BANDAGES/DRESSINGS) ×2 IMPLANT
GAUZE SPONGE 4X4 16PLY XRAY LF (GAUZE/BANDAGES/DRESSINGS) ×4 IMPLANT
GLOVE BIO SURGEON STRL SZ 6.5 (GLOVE) ×1 IMPLANT
GLOVE BIOGEL M STRL SZ7.5 (GLOVE) ×6 IMPLANT
GLOVE ECLIPSE 8.0 STRL XLNG CF (GLOVE) ×3 IMPLANT
GOWN STRL REUS W/TWL LRG LVL3 (GOWN DISPOSABLE) ×1 IMPLANT
GOWN STRL REUS W/TWL XL LVL3 (GOWN DISPOSABLE) ×3 IMPLANT
HOLDER FOLEY CATH W/STRAP (MISCELLANEOUS) ×1 IMPLANT
KIT BASIN OR (CUSTOM PROCEDURE TRAY) ×2 IMPLANT
NDL MAYO 6 CRC TAPER PT (NEEDLE) IMPLANT
NEEDLE HYPO 22GX1.5 SAFETY (NEEDLE) ×1 IMPLANT
NEEDLE MAYO 6 CRC TAPER PT (NEEDLE) IMPLANT
PACK CYSTO (CUSTOM PROCEDURE TRAY) ×2 IMPLANT
PAD OB MATERNITY 4.3X12.25 (PERSONAL CARE ITEMS) ×1 IMPLANT
PLUG CATH AND CAP STER (CATHETERS) ×2 IMPLANT
RETRACTOR STAY HOOK 5MM (MISCELLANEOUS) ×1 IMPLANT
SHEET LAVH (DRAPES) ×2 IMPLANT
SUT CAPIO ETHIBPND (SUTURE) IMPLANT
SUT CAPIO POLYGLYCOLIC (SUTURE) IMPLANT
SUT ETHIBOND 0 (SUTURE) IMPLANT
SUT SILK 2 0 SH (SUTURE) IMPLANT
SUT SILK 3 0 SH CR/8 (SUTURE) ×1 IMPLANT
SUT VIC AB 0 CT1 27 (SUTURE)
SUT VIC AB 0 CT1 27XBRD ANTBC (SUTURE) IMPLANT
SUT VIC AB 2-0 CT1 27 (SUTURE)
SUT VIC AB 2-0 CT1 27XBRD (SUTURE) IMPLANT
SUT VIC AB 2-0 SH 27 (SUTURE) ×8
SUT VIC AB 2-0 SH 27X BRD (SUTURE) ×2 IMPLANT
SUT VIC AB 3-0 SH 27 (SUTURE)
SUT VIC AB 3-0 SH 27XBRD (SUTURE) IMPLANT
SUT VIC AB 4-0 PS2 27 (SUTURE) ×2 IMPLANT
SUT VICRYL 0 UR6 27IN ABS (SUTURE) IMPLANT
SYR 10ML LL (SYRINGE) ×2 IMPLANT
TOWEL NATURAL 10PK STERILE (DISPOSABLE) ×2 IMPLANT
TOWEL OR NON WOVEN STRL DISP B (DISPOSABLE) ×2 IMPLANT
UNDERPAD 30X30 (UNDERPADS AND DIAPERS) ×1 IMPLANT
WATER STERILE IRR 1000ML POUR (IV SOLUTION) ×1 IMPLANT
WATER STERILE IRR 500ML POUR (IV SOLUTION) ×1 IMPLANT
YANKAUER SUCT BULB TIP 10FT TU (MISCELLANEOUS) ×2 IMPLANT

## 2018-01-09 NOTE — Anesthesia Procedure Notes (Signed)
Procedure Name: LMA Insertion Performed by: Cheila Wickstrom J, CRNA Pre-anesthesia Checklist: Patient identified, Emergency Drugs available, Suction available, Patient being monitored and Timeout performed Patient Re-evaluated:Patient Re-evaluated prior to induction Oxygen Delivery Method: Circle system utilized Preoxygenation: Pre-oxygenation with 100% oxygen Induction Type: IV induction Ventilation: Mask ventilation without difficulty LMA: LMA inserted LMA Size: 4.0 Number of attempts: 1 Placement Confirmation: positive ETCO2,  CO2 detector and breath sounds checked- equal and bilateral Tube secured with: Tape Dental Injury: Teeth and Oropharynx as per pre-operative assessment        

## 2018-01-09 NOTE — Op Note (Signed)
Preoperative diagnosis: Mesh sling extrusion Postoperative diagnosis: Mesh sling extrusion Surgery: Excision of extruded vaginal mesh sling with primary closure and cystoscopy Surgeon: Dr. Nicki Reaper Cuthbert Turton Assistant: Debbrah Alar  The assistant was present and necessary for all steps of the operation described. The assistant played a critical role assisting during the operation  The patient has the above diagnosis and consented to the above procedure.  Preoperative antibiotics were given.  Extra care was taken with leg positioning to minimize the risk of compartment syndrome and neuropathy and deep vein thrombosis.  I used a double ring retractor and a Foley catheter and had excellent exposure.  I could see the extruded sling underneath the urethra at approximately the midline extending to the left approximately 1.2 cm.  I could place a hemostat or forceps underneath it easily and it laid nice and flat.  It was epithelialized underneath it.  It did not look inflamed or infected.  There was some mobility of vaginal wall tissues inferior to it with rugated folds.  In my opinion it appears that the midline incision had dehisced and then split mildly to the left side.  I did not feel that I could safely de-epithelialized the epithelium underneath the sling and leave the intact exposed sling then buried.  I felt that the safest and best procedure was to remove the exposed foreign body based upon its size and extent; remove some of the epithelialized roof strip; and close the defect with a mobilized vaginal epithelial flap.  I initially split the mesh in its middle aspect with a scalpel suture after placing DeBakey forceps around it.  I could then place a tonsil on each exposed edge.  I decided to use a full elliptical incision around the dehisced region and this was performed carefully.  It was especially careful on its upper aspect and medial since the urethra was just underneath.  Following the elliptical  incision and mobilization of the tissue circumferentially I excised each exposed mesh utilizing a tonsil and scissors.  Each was placed a little bit on tension so I could make certain I exposed and then cut the mesh allowing it to retract further to the left and to the right.  I was very pleased with this step.  I then removed the easy aspect of the roof strip of thin epithelium.  I then irrigated.  I then used multiple 2-0 Vicryl interrupted sutures starting at each apex and working towards the midline.  I was very pleased with the closure.  A vaginal pack with Estrace cream was applied and Foley catheter was left in place until recovery room.  When I actually cut the sling the bladder and urethra did not descend further under anesthesia and underneath the urethra was not under tension.  She will now have mesh on the left side attached to the pelvic floor and just beyond vaginally and mesh on her right side just to the right aspect of the urethra.  The suburethral component and several millimeters to the patient's left was removed.  Hopefully the patient's continence will remain.

## 2018-01-09 NOTE — Anesthesia Preprocedure Evaluation (Signed)
Anesthesia Evaluation  Patient identified by MRN, date of birth, ID band Patient awake    Reviewed: Allergy & Precautions, NPO status , Patient's Chart, lab work & pertinent test results  Airway Mallampati: II  TM Distance: >3 FB Neck ROM: Full    Dental  (+) Teeth Intact, Dental Advisory Given   Pulmonary    breath sounds clear to auscultation       Cardiovascular  Rhythm:Regular Rate:Normal     Neuro/Psych    GI/Hepatic   Endo/Other    Renal/GU      Musculoskeletal   Abdominal   Peds  Hematology   Anesthesia Other Findings   Reproductive/Obstetrics                             Anesthesia Physical Anesthesia Plan  ASA: II  Anesthesia Plan: General   Post-op Pain Management:    Induction: Intravenous  PONV Risk Score and Plan: 1 and Ondansetron and Dexamethasone  Airway Management Planned: LMA  Additional Equipment:   Intra-op Plan:   Post-operative Plan:   Informed Consent: I have reviewed the patients History and Physical, chart, labs and discussed the procedure including the risks, benefits and alternatives for the proposed anesthesia with the patient or authorized representative who has indicated his/her understanding and acceptance.   Dental advisory given  Plan Discussed with: CRNA and Anesthesiologist  Anesthesia Plan Comments:         Anesthesia Quick Evaluation

## 2018-01-09 NOTE — Transfer of Care (Signed)
Immediate Anesthesia Transfer of Care Note  Patient: MATALYNN GRAFF  Procedure(s) Performed: REMOVAL OF MESH URINARY SLING EXTRUSION  CYSTOSTOPY (N/A )  Patient Location: PACU  Anesthesia Type:General  Level of Consciousness: awake, alert  and oriented  Airway & Oxygen Therapy: Patient Spontanous Breathing and Patient connected to face mask oxygen  Post-op Assessment: Report given to RN and Post -op Vital signs reviewed and stable  Post vital signs: Reviewed and stable  Last Vitals:  Vitals Value Taken Time  BP 116/99 01/09/2018  9:26 AM  Temp    Pulse 94 01/09/2018  9:28 AM  Resp 10 01/09/2018  9:28 AM  SpO2 100 % 01/09/2018  9:28 AM  Vitals shown include unvalidated device data.  Last Pain:  Vitals:   01/09/18 0614  TempSrc: Oral      Patients Stated Pain Goal: 4 (34/03/70 [id number])  Complications: No apparent anesthesia complications

## 2018-01-09 NOTE — Anesthesia Postprocedure Evaluation (Signed)
Anesthesia Post Note  Patient: [name]  Procedure(s) Performed: REMOVAL OF MESH URINARY SLING EXTRUSION  CYSTOSTOPY (N/A )     Patient location during evaluation: PACU Anesthesia Type: General Level of consciousness: awake and alert Pain management: pain level controlled Vital Signs Assessment: post-procedure vital signs reviewed and stable Respiratory status: spontaneous breathing, nonlabored ventilation, respiratory function stable and patient connected to nasal cannula oxygen Cardiovascular status: blood pressure returned to baseline and stable Postop Assessment: no apparent nausea or vomiting Anesthetic complications: no    Last Vitals:  Vitals:   01/09/18 1026 01/09/18 1057  BP: 110/60 109/67  Pulse: 95 100  Resp:  18  Temp: (!) 36.4 C (!) 36.1 C  SpO2: 96% 98%    Last Pain:  Vitals:   01/09/18 1057  TempSrc:   PainSc: 5                  Bo Teicher COKER

## 2018-01-09 NOTE — Discharge Instructions (Signed)
CYSTOSCOPY HOME CARE INSTRUCTIONS  Activity: Rest for the remainder of the day.  Do not drive or operate equipment today.  You may resume normal activities in one to two days as instructed by your physician.   Meals: Drink plenty of liquids and eat light foods such as gelatin or soup this evening.  You may return to a normal meal plan tomorrow.  Return to Work: You may return to work in one to two days or as instructed by your physician.  Special Instructions / Symptoms: Call your physician if any of these symptoms occur:   -persistent or heavy bleeding  -bleeding which continues after first few urination  -large blood clots that are difficult to pass  -urine stream diminishes or stops completely  -fever equal to or higher than 101 degrees Farenheit.  -cloudy urine with a strong, foul odor  -severe pain  Females should always wipe from front to back after elimination.  You may feel some burning pain when you urinate.  This should disappear with time.  Applying moist heat to the lower abdomen or a hot tub bath may help relieve the pain. \  Follow-Up / Date of Return Visit to Your Physician: as instructed     Post Anesthesia Home Care Instructions  Activity: Get plenty of rest for the remainder of the day. A responsible individual must stay with you for 24 hours following the procedure.  For the next 24 hours, DO NOT: -Drive a car -Paediatric nurse -Drink alcoholic beverages -Take any medication unless instructed by your physician -Make any legal decisions or sign important papers.  Meals: Start with liquid foods such as gelatin or soup. Progress to regular foods as tolerated. Avoid greasy, spicy, heavy foods. If nausea and/or vomiting occur, drink only clear liquids until the nausea and/or vomiting subsides. Call your physician if vomiting continues.  Special Instructions/Symptoms: Your throat may feel dry or sore from the anesthesia or the breathing tube placed in your  throat during surgery. If this causes discomfort, gargle with warm salt water. The discomfort should disappear within 24 hours.  If you had a scopolamine patch placed behind your ear for the management of post- operative nausea and/or vomiting:  1. The medication in the patch is effective for 72 hours, after which it should be removed.  Wrap patch in a tissue and discard in the trash. Wash hands thoroughly with soap and water. 2. You may remove the patch earlier than 72 hours if you experience unpleasant side effects which may include dry mouth, dizziness or visual disturbances. 3. Avoid touching the patch. Wash your hands with soap and water after contact with the patch.    Call for an appointment to arrange follow-up.  Patient Signature:  ________________________________________________________  Nurse's Signature:  ________________________________________________________ I have reviewed discharge instructions in detail with the patient. They will follow-up with me or their physician as scheduled. My nurse will also be calling the patients as per protocol. As discussed with Dr. Matilde Sprang.

## 2018-01-09 NOTE — Interval H&P Note (Signed)
History and Physical Interval Note:  01/09/2018 7:14 AM  [name]  has presented today for surgery, with the diagnosis of EXTRUDED MASH SLING  The various methods of treatment have been discussed with the patient and family. After consideration of risks, benefits and other options for treatment, the patient has consented to  Procedure(s): REMOVAL OF MESH URINARY SLING EXTRUSION  CYSTOSTOPY (N/Scott) as Scott surgical intervention .  The patient's history has been reviewed, patient examined, no change in status, stable for surgery.  I have reviewed the patient's chart and labs.  Questions were answered to the patient's satisfaction.     [name]

## 2018-03-16 DIAGNOSIS — G43909 Migraine, unspecified, not intractable, without status migrainosus: Secondary | ICD-10-CM | POA: Diagnosis not present

## 2018-03-16 DIAGNOSIS — R112 Nausea with vomiting, unspecified: Secondary | ICD-10-CM | POA: Diagnosis not present

## 2018-03-16 DIAGNOSIS — M797 Fibromyalgia: Secondary | ICD-10-CM | POA: Diagnosis not present

## 2018-03-21 ENCOUNTER — Encounter

## 2018-03-23 ENCOUNTER — Encounter

## 2018-03-26 IMAGING — DX DG CHEST 2V
2 series · 2 of 2 positions shown · non-contrast
Comparison: 05/05/2015

CLINICAL DATA: Sudden onset right upper quadrant pain

EXAM:
CHEST  2 VIEW

[w chest pa]
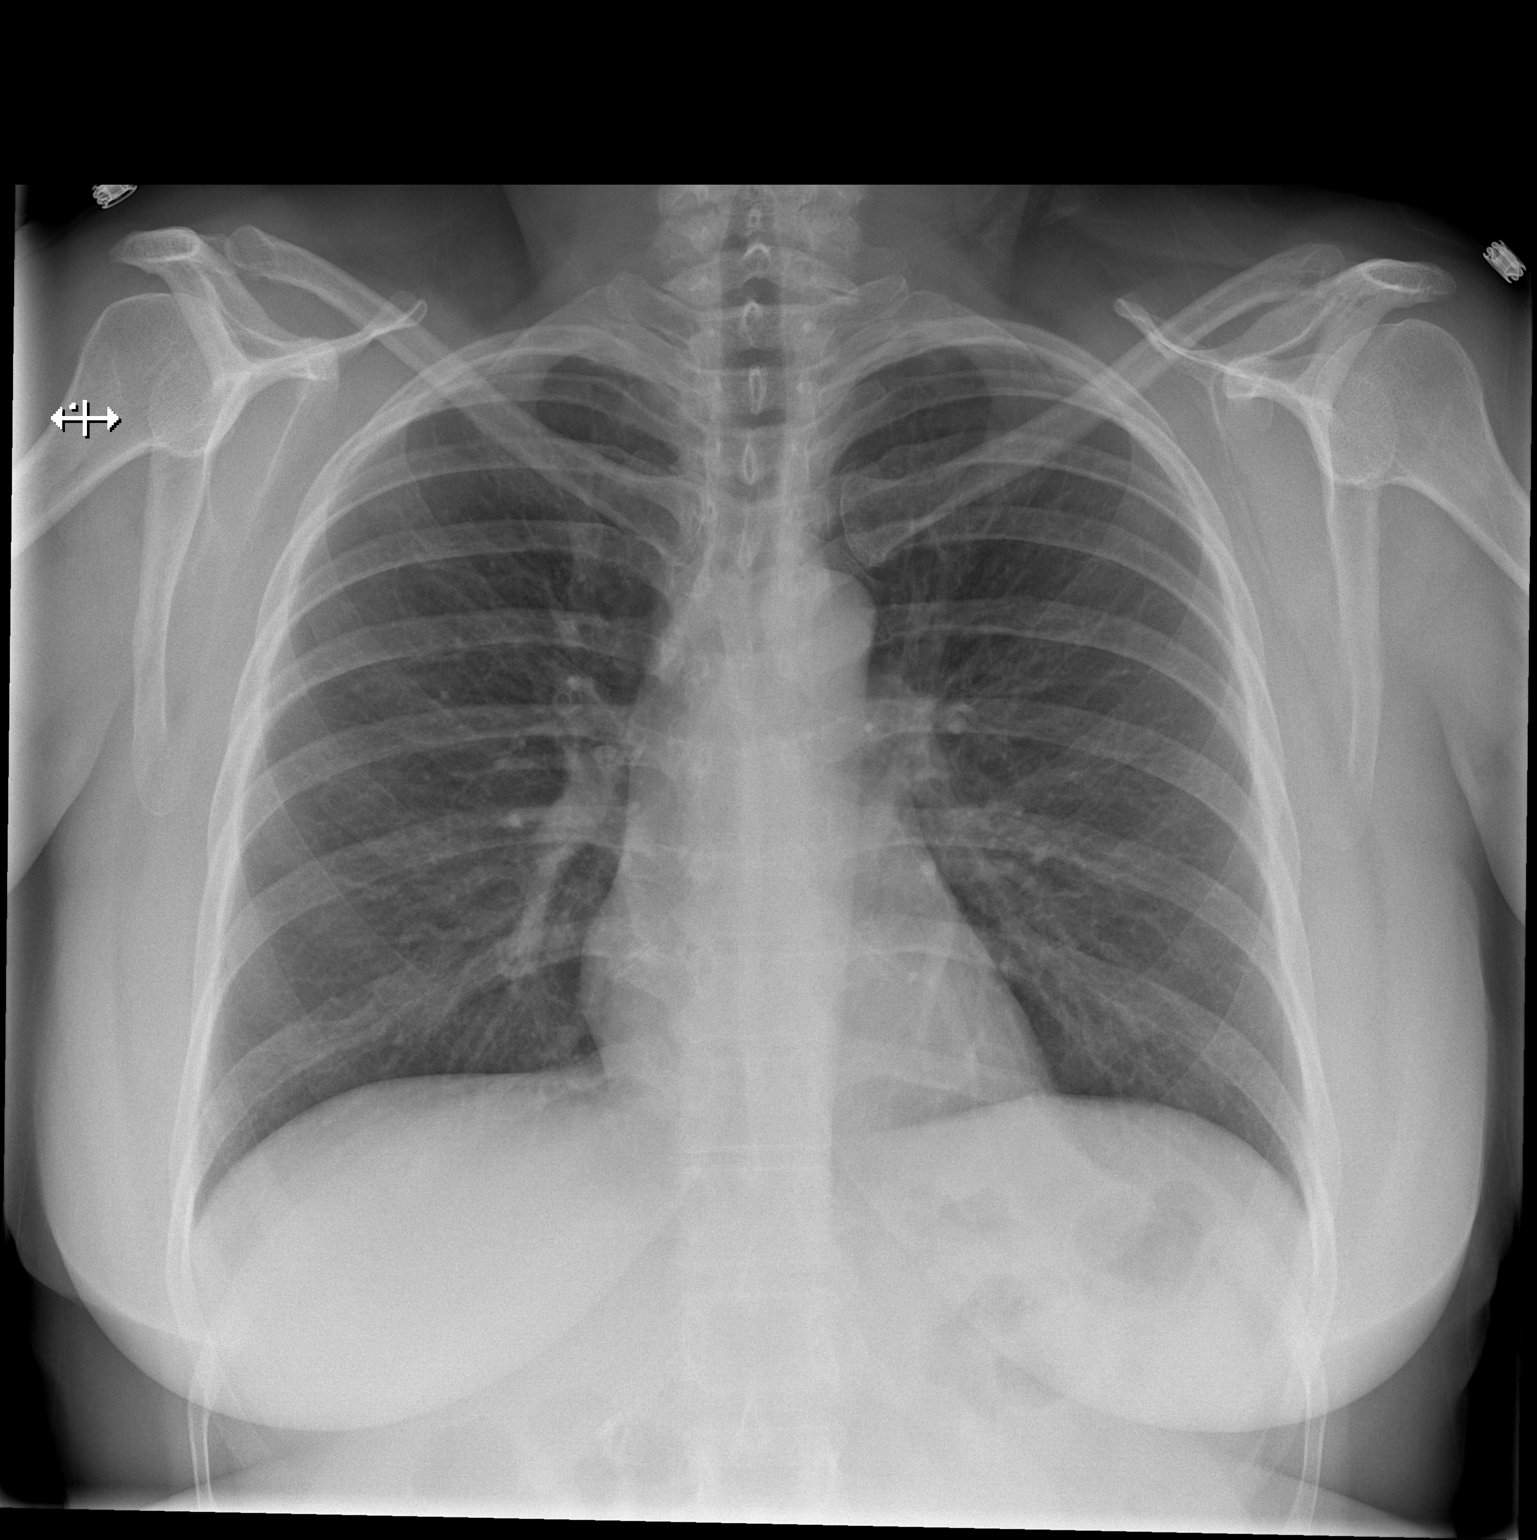

[w chest lat]
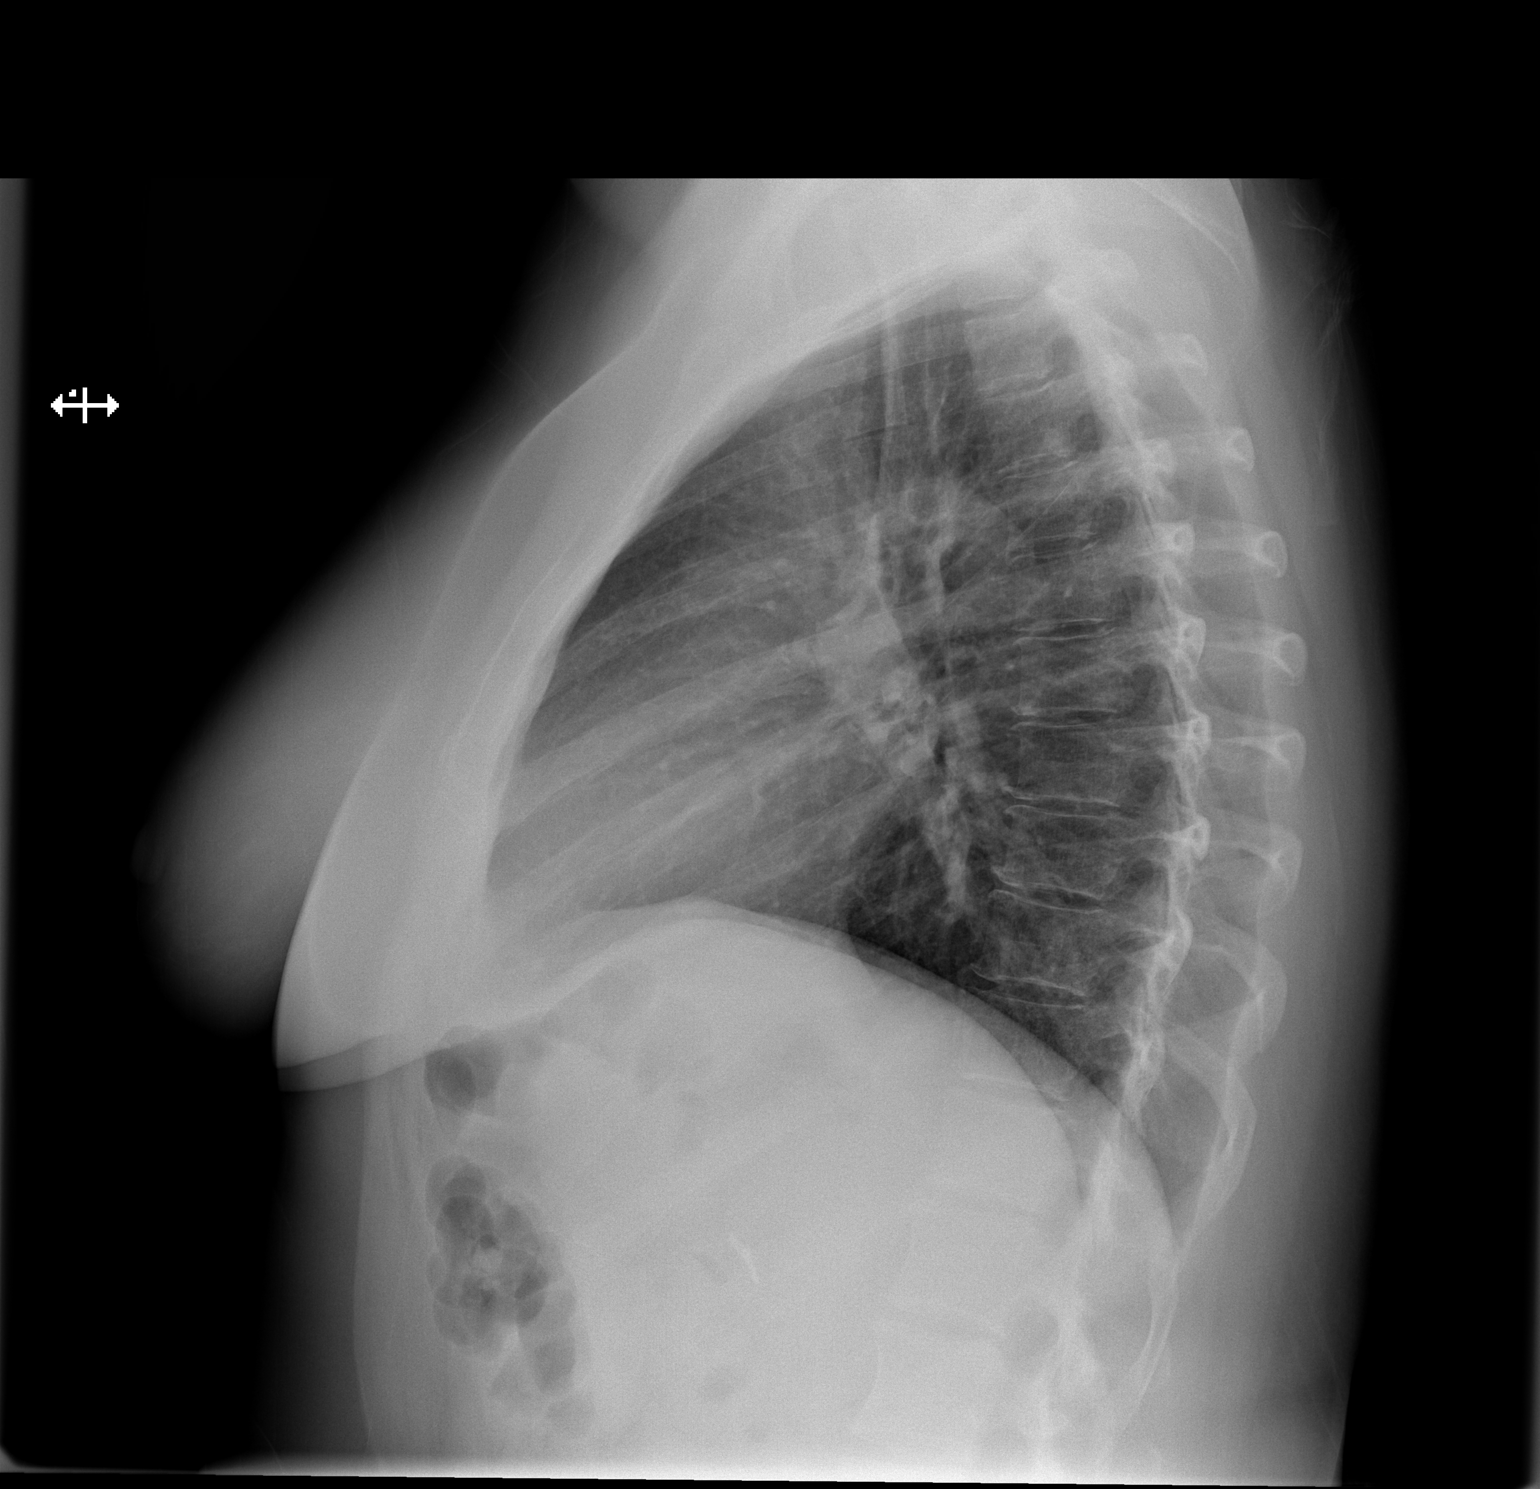

[2 of 2 positions shown; findings below may reference images not displayed]

FINDINGS: The heart size and mediastinal contours are within normal limits.
Both lungs are clear. The visualized skeletal structures are
unremarkable.
IMPRESSION: No active cardiopulmonary disease.

## 2018-04-03 DIAGNOSIS — R350 Frequency of micturition: Secondary | ICD-10-CM | POA: Diagnosis not present

## 2018-04-11 ENCOUNTER — Ambulatory Visit: Payer: 59 | Admitting: Neurology

## 2018-06-11 DIAGNOSIS — M797 Fibromyalgia: Secondary | ICD-10-CM | POA: Diagnosis not present

## 2018-06-25 DIAGNOSIS — M797 Fibromyalgia: Secondary | ICD-10-CM | POA: Diagnosis not present

## 2018-07-04 DIAGNOSIS — M797 Fibromyalgia: Secondary | ICD-10-CM | POA: Diagnosis not present

## 2018-07-09 DIAGNOSIS — M797 Fibromyalgia: Secondary | ICD-10-CM | POA: Diagnosis not present

## 2018-07-16 DIAGNOSIS — J329 Chronic sinusitis, unspecified: Secondary | ICD-10-CM | POA: Diagnosis not present

## 2018-07-16 DIAGNOSIS — M797 Fibromyalgia: Secondary | ICD-10-CM | POA: Diagnosis not present

## 2018-07-16 DIAGNOSIS — R000 Tachycardia, unspecified: Secondary | ICD-10-CM | POA: Diagnosis not present

## 2018-07-16 DIAGNOSIS — R05 Cough: Secondary | ICD-10-CM | POA: Diagnosis not present

## 2018-07-25 DIAGNOSIS — M797 Fibromyalgia: Secondary | ICD-10-CM | POA: Diagnosis not present

## 2018-08-02 DIAGNOSIS — R32 Unspecified urinary incontinence: Secondary | ICD-10-CM | POA: Diagnosis not present

## 2018-08-07 DIAGNOSIS — H1013 Acute atopic conjunctivitis, bilateral: Secondary | ICD-10-CM | POA: Diagnosis not present

## 2018-08-08 DIAGNOSIS — M797 Fibromyalgia: Secondary | ICD-10-CM | POA: Diagnosis not present

## 2018-08-17 ENCOUNTER — Ambulatory Visit (INDEPENDENT_AMBULATORY_CARE_PROVIDER_SITE_OTHER): Payer: 59 | Admitting: Plastic Surgery

## 2018-08-17 DIAGNOSIS — M546 Pain in thoracic spine: Secondary | ICD-10-CM | POA: Diagnosis not present

## 2018-08-17 DIAGNOSIS — M542 Cervicalgia: Secondary | ICD-10-CM

## 2018-08-17 DIAGNOSIS — N62 Hypertrophy of breast: Secondary | ICD-10-CM

## 2018-08-17 DIAGNOSIS — G8929 Other chronic pain: Secondary | ICD-10-CM

## 2018-08-18 ENCOUNTER — Encounter: Payer: Self-pay | Admitting: Plastic Surgery

## 2018-08-18 DIAGNOSIS — M542 Cervicalgia: Secondary | ICD-10-CM | POA: Insufficient documentation

## 2018-08-18 DIAGNOSIS — N62 Hypertrophy of breast: Secondary | ICD-10-CM | POA: Insufficient documentation

## 2018-08-18 DIAGNOSIS — M549 Dorsalgia, unspecified: Secondary | ICD-10-CM | POA: Insufficient documentation

## 2018-08-18 NOTE — Progress Notes (Signed)
Patient ID: [name], female    DOB: 28-Sep-1971, 47 y.o.   MRN: [id number]   Chief Complaint  Patient presents with  . Breast Problem  . Skin Problem    Mammary Hyperplasia: The patient is a 47 y.o. female with a history of mammary hyperplasia for several years.  She has extremely large breasts causing symptoms that include the following: Back pain (upper and lower) and neck pain.  Notices relief when holding breast up in her hands. Shoulder straps causing grooves. Pain medication is sometimes required with motrin and tylenol.  Activities that are hindered by enlarged breasts include: exercise.  She has been in PT for her back pain without improvement.  She has grade III ptosis bilaterally.   Her breasts are extremely large and fairly symmetric.  She has hyperpigmentation of the inframammary area on both sides.  The sternal to nipple distance on the right is 27 cm and the left is 27 cm.  The IMF distance is 22 cm. NAC to inframammary fold = 10 and NAC size = 4 cm.  She is 5 feet 2 inches tall and weighs 180 pounds.  Preoperative bra size = 38 DD cup.  States she does not care what size she ends up. The estimated excess breast tissue to be removed at the time of surgery = 300 grams on the left and 300 grams on the right.  She has an extensive medical history noted below.  She is also interested in an abdominoplasty.  She may need more urologic surgery and would like to coordinate if possible.  She has severe fibromyalgia.  The patient has had anesthesia or sedation in the past.   The patient has not had problems with anesthesia.   Review of Systems  Constitutional: Negative.   HENT: Negative.   Eyes: Negative.   Respiratory: Negative.   Cardiovascular: Negative.   Gastrointestinal: Negative.   Endocrine: Negative.   Genitourinary: Positive for dysuria.  Musculoskeletal: Positive for back pain, myalgias and neck pain.  Skin: Negative.  Negative for color change and wound.    Neurological: Negative for speech difficulty.  Psychiatric/Behavioral: The patient is nervous/anxious.     Past Medical History:  Diagnosis Date  . ADD (attention deficit disorder)   . Anxiety   . Arthropathy, unspecified, site unspecified   . Chronic fatigue syndrome   . Depression   . Disc degeneration   . Disturbance of skin sensation   . Endometriosis   . Endometriosis   . Fibromyalgia   . History of kidney stones   . Panic attack 2019  . PTSD (post-traumatic stress disorder) 2019  . Sleep disorder 01/08/2014  . Sleep disturbance, unspecified   . TMJ (temporomandibular joint disorder)   . Unspecified nonpsychotic mental disorder     Past Surgical History:  Procedure Laterality Date  . ABDOMINAL HYSTERECTOMY    . BILATERAL SALPINGECTOMY Bilateral 10/16/2013   Procedure: BILATERAL SALPINGECTOMY;  Surgeon: Lenoard Aden, MD;  Location: [facility];  Service: Gynecology;  Laterality: Bilateral;  . CESAREAN SECTION     x2  . CHOLECYSTECTOMY    . DIAGNOSTIC LAPAROSCOPY    . MANDIBLE SURGERY    . REMOVAL OF URINARY SLING N/A 01/09/2018   Procedure: REMOVAL OF MESH URINARY SLING EXTRUSION  CYSTOSTOPY;  Surgeon: Alfredo Martinez, MD;  Location: [facility];  Service: Urology;  Laterality: N/A;  . ROBOTIC ASSISTED TOTAL HYSTERECTOMY N/A 10/16/2013   Procedure: ROBOTIC ASSISTED TOTAL HYSTERECTOMY;  Surgeon: Herschell Dimes  Billy Coast, MD;  Location: [facility];  Service: Gynecology;  Laterality: N/A;      Current Outpatient Medications:  .  ALPRAZolam (XANAX) 0.5 MG tablet, Take 1 mg by mouth 3 (three) times daily as needed for anxiety or sleep. , Disp: , Rfl:  .  amphetamine-dextroamphetamine (ADDERALL XR) 30 MG 24 hr capsule, Take 30 mg by mouth 2 (two) times daily., Disp: , Rfl:  .  Cholecalciferol (VITAMIN D3) 10000 units TABS, Take 10,000 Units by mouth every other day., Disp: , Rfl:  .  DULoxetine (CYMBALTA) 60 MG capsule, Take 60 mg by mouth daily. , Disp: , Rfl:  .  furosemide (LASIX) 40  MG tablet, Take 40 mg by mouth daily as needed (for swelling). , Disp: , Rfl:  .  neomycin-polymyxin-dexameth (MAXITROL) 0.1 % OINT, Place 1 application into both eyes 2 (two) times daily., Disp: , Rfl:  .  prednisoLONE acetate (PRED FORTE) 1 % ophthalmic suspension, Place 1 drop into both eyes daily as needed (for eye irritation.)., Disp: , Rfl:  .  SAXENDA 18 MG/3ML SOPN, Inject 1.8 mg into the skin daily., Disp: , Rfl: 1 .  traZODone (DESYREL) 150 MG tablet, Take 150 mg by mouth at bedtime as needed for sleep., Disp: , Rfl:  .  HYDROcodone-acetaminophen (NORCO) 5-325 MG tablet, Take 1-2 tablets by mouth every 6 (six) hours as needed for moderate pain or severe pain., Disp: 30 tablet, Rfl: 0   Objective:   Vitals:   08/18/18 1000  BP: 130/88  Pulse: 73  Resp: 16  SpO2: 94%    Physical Exam  Constitutional: She is oriented to person, place, and time. She appears well-developed and well-nourished.  HENT:  Head: Normocephalic and atraumatic.  Eyes: Pupils are equal, round, and reactive to light. EOM are normal.  Cardiovascular: Normal rate.  Pulmonary/Chest: Effort normal. No respiratory distress.  Abdominal: Soft. She exhibits no distension. There is no tenderness. There is no guarding.  Neurological: She is alert and oriented to person, place, and time.  Skin: Skin is warm.  Psychiatric: She has a normal mood and affect. Her behavior is normal.    Assessment & Plan:  Neck pain  Chronic bilateral thoracic back pain  Symptomatic mammary hypertrophy  We had a long discussion about the surgical indication for the breast reduction and the abdominoplasty.  I expressed that the amount of weight removed in her case is likely minimal on the abdomen (1-2 pounds).  She would like a quote for the abdominoplasty.  She is interested in a breast reduction.  This is reasonable.  I can not provide any prognosis to her pain issues.  Only that the surgery can reduce the weight on her shoulders.   She will need to get her mammogram report to [name] and notes from PT and PCP.  Alena Bills Dillingham, DO

## 2018-08-22 DIAGNOSIS — M797 Fibromyalgia: Secondary | ICD-10-CM | POA: Diagnosis not present

## 2018-08-29 DIAGNOSIS — M545 Low back pain: Secondary | ICD-10-CM | POA: Diagnosis not present

## 2018-08-29 DIAGNOSIS — M546 Pain in thoracic spine: Secondary | ICD-10-CM | POA: Diagnosis not present

## 2018-09-06 DIAGNOSIS — H0259 Other disorders affecting eyelid function: Secondary | ICD-10-CM | POA: Diagnosis not present

## 2018-09-16 DIAGNOSIS — N62 Hypertrophy of breast: Secondary | ICD-10-CM

## 2018-09-16 HISTORY — DX: Hypertrophy of breast: N62

## 2018-09-17 DIAGNOSIS — M797 Fibromyalgia: Secondary | ICD-10-CM | POA: Diagnosis not present

## 2018-09-18 ENCOUNTER — Telehealth: Payer: Self-pay | Admitting: Plastic Surgery

## 2018-09-18 NOTE — Telephone Encounter (Signed)
Spoke with Market researcher from health insurance.   Plan to take 400+ gm. Approved for surgery.

## 2018-09-21 DIAGNOSIS — H04123 Dry eye syndrome of bilateral lacrimal glands: Secondary | ICD-10-CM | POA: Diagnosis not present

## 2018-09-24 DIAGNOSIS — M797 Fibromyalgia: Secondary | ICD-10-CM | POA: Diagnosis not present

## 2018-09-28 ENCOUNTER — Other Ambulatory Visit: Payer: Self-pay | Admitting: Obstetrics and Gynecology

## 2018-09-28 ENCOUNTER — Ambulatory Visit (INDEPENDENT_AMBULATORY_CARE_PROVIDER_SITE_OTHER): Payer: Self-pay | Admitting: Plastic Surgery

## 2018-09-28 ENCOUNTER — Encounter: Payer: Self-pay | Admitting: Plastic Surgery

## 2018-09-28 VITALS — BP 110/80 | HR 100 | Ht 62.0 in | Wt 181.8 lb

## 2018-09-28 DIAGNOSIS — Z1231 Encounter for screening mammogram for malignant neoplasm of breast: Secondary | ICD-10-CM

## 2018-09-28 DIAGNOSIS — M542 Cervicalgia: Secondary | ICD-10-CM

## 2018-09-28 DIAGNOSIS — G8929 Other chronic pain: Secondary | ICD-10-CM

## 2018-09-28 DIAGNOSIS — M546 Pain in thoracic spine: Secondary | ICD-10-CM

## 2018-09-28 DIAGNOSIS — N62 Hypertrophy of breast: Secondary | ICD-10-CM

## 2018-09-28 MED ORDER — ONDANSETRON HCL 4 MG PO TABS: 4.0000 mg | ORAL_TABLET | Freq: Every day | ORAL | 0 refills | Status: DC | PRN

## 2018-09-28 MED ORDER — HYDROCODONE-ACETAMINOPHEN 5-325 MG PO TABS: 2.0000 | ORAL_TABLET | Freq: Four times a day (QID) | ORAL | 0 refills | Status: DC | PRN

## 2018-09-28 MED ORDER — CEPHALEXIN 500 MG PO CAPS: 500.0000 mg | ORAL_CAPSULE | Freq: Four times a day (QID) | ORAL | 0 refills | Status: DC

## 2018-09-28 MED ORDER — CEPHALEXIN 500 MG PO CAPS: 500.0000 mg | ORAL_CAPSULE | Freq: Four times a day (QID) | ORAL | 0 refills | Status: AC

## 2018-09-28 MED ORDER — ONDANSETRON HCL 4 MG PO TABS: 4.0000 mg | ORAL_TABLET | Freq: Every day | ORAL | 1 refills | Status: DC | PRN

## 2018-09-28 NOTE — Progress Notes (Signed)
Patient ID: [name], female    DOB: 03-Dec-1970, 47 y.o.   MRN: [id number]   Chief Complaint  Patient presents with  . Advice Only    to discuss breast reduction  . Breast Problem    [name] is a 47 year old white female here for her history and physical for a breast reduction.  [name] has suffered with neck and back pain for several years.  [name] is interested in the reduction and has been treated multiple times in the past for the neck and the back pain her last mammogram was October 2018 and was negative.  [name] has several medical conditions that are currently under control and has done very well with weight reduction.  [name] is currently doing well with a high-protein diet and not smoking.   Review of Systems  Constitutional: Negative.   HENT: Negative.   Eyes: Negative.   Respiratory: Negative.   Cardiovascular: Negative.   Gastrointestinal: Negative.   Endocrine: Negative.   Genitourinary: Negative.   Musculoskeletal: Positive for back pain, neck pain and neck stiffness.  Skin: Negative.   Neurological: Negative.   Hematological: Negative.     Past Medical History:  Diagnosis Date  . ADD (attention deficit disorder)   . Anxiety   . Arthropathy, unspecified, site unspecified   . Chronic fatigue syndrome   . Depression   . Disc degeneration   . Disturbance of skin sensation   . Endometriosis   . Endometriosis   . Fibromyalgia   . History of kidney stones   . Panic attack 2019  . PTSD (post-traumatic stress disorder) 2019  . Sleep disorder 01/08/2014  . Sleep disturbance, unspecified   . TMJ (temporomandibular joint disorder)   . Unspecified nonpsychotic mental disorder     Past Surgical History:  Procedure Laterality Date  . ABDOMINAL HYSTERECTOMY    . BILATERAL SALPINGECTOMY Bilateral 10/16/2013   Procedure: BILATERAL SALPINGECTOMY;  Surgeon: Lenoard Aden, MD;  Location: [facility];  Service: Gynecology;  Laterality: Bilateral;  . CESAREAN SECTION     x2  . CHOLECYSTECTOMY    . DIAGNOSTIC LAPAROSCOPY    . MANDIBLE SURGERY    . REMOVAL OF URINARY SLING N/A 01/09/2018   Procedure: REMOVAL OF MESH URINARY SLING EXTRUSION  CYSTOSTOPY;  Surgeon: Alfredo Martinez, MD;  Location: [facility];  Service: Urology;  Laterality: N/A;  . ROBOTIC ASSISTED TOTAL HYSTERECTOMY N/A 10/16/2013   Procedure: ROBOTIC ASSISTED TOTAL HYSTERECTOMY;  Surgeon: Lenoard Aden, MD;  Location: [facility];  Service: Gynecology;  Laterality: N/A;      Current Outpatient Medications:  .  ALPRAZolam (XANAX) 1 MG tablet, As directed., Disp: , Rfl:  .  Cholecalciferol (VITAMIN D3) 10000 units TABS, Take 10,000 Units by mouth every other day., Disp: , Rfl:  .  DULoxetine (CYMBALTA) 60 MG capsule, Take 60 mg by mouth daily. , Disp: , Rfl:  .  SAXENDA 18 MG/3ML SOPN, Inject 1.8 mg into the skin daily., Disp: , Rfl: 1 .  traZODone (DESYREL) 150 MG tablet, Take 150 mg by mouth at bedtime as needed for sleep., Disp: , Rfl:  .  VYVANSE 70 MG capsule, TAKE 1 CAPSULE BY MOUTH IN THE MORNING, Disp: , Rfl: 0   Objective:   Vitals:   09/28/18 1544  BP: 110/80  Pulse: 100  SpO2: 97%    Physical Exam Vitals signs and nursing note reviewed.  Constitutional:      Appearance: Normal appearance.  HENT:  Head: Normocephalic and atraumatic.     Nose: Nose normal.  Neck:     Musculoskeletal: Normal range of motion.  Cardiovascular:     Rate and Rhythm: Normal rate.  Pulmonary:     Effort: Pulmonary effort is normal.  Abdominal:     General: Abdomen is flat.  Neurological:     General: No focal deficit present.     Mental Status: [name] is alert.  Psychiatric:        Mood and Affect: Mood normal.        Behavior: Behavior normal.        Thought Content: Thought content normal.     Assessment & Plan:  Symptomatic mammary hypertrophy  Neck pain  Chronic bilateral thoracic back pain  Plan for bilateral breast reduction.   The risk that can be encountered with breast  reduction were discussed and include the following but not limited to these:  Breast asymmetry, fluid accumulation, firmness of the breast, inability to breast feed, loss of nipple or areola, skin loss, decrease or no nipple sensation, fat necrosis of the breast tissue, bleeding, infection, healing delay.  There are risks of anesthesia, changes to skin sensation and injury to nerves or blood vessels.  The muscle can be temporarily or permanently injured.  You may have an allergic reaction to tape, suture, glue, blood products which can result in skin discoloration, swelling, pain, skin lesions, poor healing.  Any of these can lead to the need for revisonal surgery or stage procedures.  A reduction has potential to interfere with diagnostic procedures.  Nipple or breast piercing can increase risks of infection.  This procedure is best done when the breast is fully developed.  Changes in the breast will continue to occur over time.  Pregnancy can alter the outcomes of previous breast reduction surgery, weight gain and weigh loss can also effect the long term appearance.   Alena Bills Dillingham, DO

## 2018-09-28 NOTE — H&P (View-Only) (Signed)
Patient ID: [name], female    DOB: 03-Dec-1970, 47 y.o.   MRN: [id number]   Chief Complaint  Patient presents with  . Advice Only    to discuss breast reduction  . Breast Problem    [name] is a 47 year old white female here for her history and physical for a breast reduction.  She has suffered with neck and back pain for several years.  She is interested in the reduction and has been treated multiple times in the past for the neck and the back pain her last mammogram was October 2018 and was negative.  She has several medical conditions that are currently under control and has done very well with weight reduction.  She is currently doing well with a high-protein diet and not smoking.   Review of Systems  Constitutional: Negative.   HENT: Negative.   Eyes: Negative.   Respiratory: Negative.   Cardiovascular: Negative.   Gastrointestinal: Negative.   Endocrine: Negative.   Genitourinary: Negative.   Musculoskeletal: Positive for back pain, neck pain and neck stiffness.  Skin: Negative.   Neurological: Negative.   Hematological: Negative.     Past Medical History:  Diagnosis Date  . ADD (attention deficit disorder)   . Anxiety   . Arthropathy, unspecified, site unspecified   . Chronic fatigue syndrome   . Depression   . Disc degeneration   . Disturbance of skin sensation   . Endometriosis   . Endometriosis   . Fibromyalgia   . History of kidney stones   . Panic attack 2019  . PTSD (post-traumatic stress disorder) 2019  . Sleep disorder 01/08/2014  . Sleep disturbance, unspecified   . TMJ (temporomandibular joint disorder)   . Unspecified nonpsychotic mental disorder     Past Surgical History:  Procedure Laterality Date  . ABDOMINAL HYSTERECTOMY    . BILATERAL SALPINGECTOMY Bilateral 10/16/2013   Procedure: BILATERAL SALPINGECTOMY;  Surgeon: Tracey Aden, MD;  Location: [facility];  Service: Gynecology;  Laterality: Bilateral;  . CESAREAN SECTION     x2  . CHOLECYSTECTOMY    . DIAGNOSTIC LAPAROSCOPY    . MANDIBLE SURGERY    . REMOVAL OF URINARY SLING N/A 01/09/2018   Procedure: REMOVAL OF MESH URINARY SLING EXTRUSION  CYSTOSTOPY;  Surgeon: Tracey Martinez, MD;  Location: [facility];  Service: Urology;  Laterality: N/A;  . ROBOTIC ASSISTED TOTAL HYSTERECTOMY N/A 10/16/2013   Procedure: ROBOTIC ASSISTED TOTAL HYSTERECTOMY;  Surgeon: Tracey Aden, MD;  Location: [facility];  Service: Gynecology;  Laterality: N/A;      Current Outpatient Medications:  .  ALPRAZolam (XANAX) 1 MG tablet, As directed., Disp: , Rfl:  .  Cholecalciferol (VITAMIN D3) 10000 units TABS, Take 10,000 Units by mouth every other day., Disp: , Rfl:  .  DULoxetine (CYMBALTA) 60 MG capsule, Take 60 mg by mouth daily. , Disp: , Rfl:  .  SAXENDA 18 MG/3ML SOPN, Inject 1.8 mg into the skin daily., Disp: , Rfl: 1 .  traZODone (DESYREL) 150 MG tablet, Take 150 mg by mouth at bedtime as needed for sleep., Disp: , Rfl:  .  VYVANSE 70 MG capsule, TAKE 1 CAPSULE BY MOUTH IN THE MORNING, Disp: , Rfl: 0   Objective:   Vitals:   09/28/18 1544  BP: 110/80  Pulse: 100  SpO2: 97%    Physical Exam Vitals signs and nursing note reviewed.  Constitutional:      Appearance: Normal appearance.  HENT:  Head: Normocephalic and atraumatic.     Nose: Nose normal.  Neck:     Musculoskeletal: Normal range of motion.  Cardiovascular:     Rate and Rhythm: Normal rate.  Pulmonary:     Effort: Pulmonary effort is normal.  Abdominal:     General: Abdomen is flat.  Neurological:     General: No focal deficit present.     Mental Status: She is alert.  Psychiatric:        Mood and Affect: Mood normal.        Behavior: Behavior normal.        Thought Content: Thought content normal.     Assessment & Plan:  Symptomatic mammary hypertrophy  Neck pain  Chronic bilateral thoracic back pain  Plan for bilateral breast reduction.   The risk that can be encountered with breast  reduction were discussed and include the following but not limited to these:  Breast asymmetry, fluid accumulation, firmness of the breast, inability to breast feed, loss of nipple or areola, skin loss, decrease or no nipple sensation, fat necrosis of the breast tissue, bleeding, infection, healing delay.  There are risks of anesthesia, changes to skin sensation and injury to nerves or blood vessels.  The muscle can be temporarily or permanently injured.  You may have an allergic reaction to tape, suture, glue, blood products which can result in skin discoloration, swelling, pain, skin lesions, poor healing.  Any of these can lead to the need for revisonal surgery or stage procedures.  A reduction has potential to interfere with diagnostic procedures.  Nipple or breast piercing can increase risks of infection.  This procedure is best done when the breast is fully developed.  Changes in the breast will continue to occur over time.  Pregnancy can alter the outcomes of previous breast reduction surgery, weight gain and weigh loss can also effect the long term appearance.   [name] , DO

## 2018-10-01 ENCOUNTER — Ambulatory Visit
Admission: RE | Admit: 2018-10-01 | Discharge: 2018-10-01 | Disposition: A | Discharge status: Home / Self Care | Payer: 59 | Source: Ambulatory Visit | Attending: Obstetrics and Gynecology | Admitting: Obstetrics and Gynecology

## 2018-10-01 ENCOUNTER — Encounter (HOSPITAL_BASED_OUTPATIENT_CLINIC_OR_DEPARTMENT_OTHER): Payer: Self-pay | Admitting: *Deleted

## 2018-10-01 ENCOUNTER — Other Ambulatory Visit: Payer: Self-pay

## 2018-10-01 DIAGNOSIS — Z1231 Encounter for screening mammogram for malignant neoplasm of breast: Secondary | ICD-10-CM

## 2018-10-03 ENCOUNTER — Encounter (HOSPITAL_BASED_OUTPATIENT_CLINIC_OR_DEPARTMENT_OTHER)
Admission: RE | Disposition: A | Discharge status: Home / Self Care | Payer: Self-pay | Source: Home / Self Care | Attending: Plastic Surgery

## 2018-10-03 ENCOUNTER — Ambulatory Visit (HOSPITAL_BASED_OUTPATIENT_CLINIC_OR_DEPARTMENT_OTHER): Payer: 59 | Admitting: Certified Registered Nurse Anesthetist

## 2018-10-03 ENCOUNTER — Ambulatory Visit: Payer: 59

## 2018-10-03 ENCOUNTER — Encounter (HOSPITAL_BASED_OUTPATIENT_CLINIC_OR_DEPARTMENT_OTHER): Payer: Self-pay

## 2018-10-03 ENCOUNTER — Ambulatory Visit (HOSPITAL_BASED_OUTPATIENT_CLINIC_OR_DEPARTMENT_OTHER)
Admission: RE | Admit: 2018-10-03 | Discharge: 2018-10-03 | Disposition: A | Discharge status: Home / Self Care | Payer: 59 | Attending: Plastic Surgery | Admitting: Plastic Surgery

## 2018-10-03 ENCOUNTER — Other Ambulatory Visit: Payer: Self-pay

## 2018-10-03 DIAGNOSIS — F4310 Post-traumatic stress disorder, unspecified: Secondary | ICD-10-CM | POA: Insufficient documentation

## 2018-10-03 DIAGNOSIS — M542 Cervicalgia: Secondary | ICD-10-CM | POA: Diagnosis not present

## 2018-10-03 DIAGNOSIS — F988 Other specified behavioral and emotional disorders with onset usually occurring in childhood and adolescence: Secondary | ICD-10-CM | POA: Insufficient documentation

## 2018-10-03 DIAGNOSIS — N6011 Diffuse cystic mastopathy of right breast: Secondary | ICD-10-CM | POA: Diagnosis not present

## 2018-10-03 DIAGNOSIS — G479 Sleep disorder, unspecified: Secondary | ICD-10-CM | POA: Diagnosis not present

## 2018-10-03 DIAGNOSIS — M546 Pain in thoracic spine: Secondary | ICD-10-CM

## 2018-10-03 DIAGNOSIS — M545 Low back pain: Secondary | ICD-10-CM | POA: Diagnosis not present

## 2018-10-03 DIAGNOSIS — F410 Panic disorder [episodic paroxysmal anxiety] without agoraphobia: Secondary | ICD-10-CM | POA: Diagnosis not present

## 2018-10-03 DIAGNOSIS — F329 Major depressive disorder, single episode, unspecified: Secondary | ICD-10-CM | POA: Insufficient documentation

## 2018-10-03 DIAGNOSIS — N6012 Diffuse cystic mastopathy of left breast: Secondary | ICD-10-CM | POA: Diagnosis not present

## 2018-10-03 DIAGNOSIS — Z79899 Other long term (current) drug therapy: Secondary | ICD-10-CM | POA: Insufficient documentation

## 2018-10-03 DIAGNOSIS — N62 Hypertrophy of breast: Secondary | ICD-10-CM

## 2018-10-03 HISTORY — PX: BREAST REDUCTION SURGERY: SHX8

## 2018-10-03 HISTORY — DX: Hypertrophy of breast: N62

## 2018-10-03 HISTORY — DX: Dorsopathy, unspecified: M53.9

## 2018-10-03 HISTORY — DX: Migraine, unspecified, not intractable, without status migrainosus: G43.909

## 2018-10-03 SURGERY — BREAST REDUCTION WITH LIPOSUCTION
Anesthesia: General | Site: Breast | Laterality: Bilateral

## 2018-10-03 MED ORDER — LIDOCAINE-EPINEPHRINE 1 %-1:100000 IJ SOLN
INTRAMUSCULAR | Status: AC
  Filled 2018-10-03: qty 1

## 2018-10-03 MED ORDER — SODIUM CHLORIDE 0.9% FLUSH: 3.0000 mL | INTRAVENOUS | Status: DC | PRN

## 2018-10-03 MED ORDER — MIDAZOLAM HCL 2 MG/2ML IJ SOLN
1.0000 mg | INTRAMUSCULAR | Status: DC | PRN
  Administered 2018-10-03: 2 mg via INTRAVENOUS

## 2018-10-03 MED ORDER — BUPIVACAINE-EPINEPHRINE (PF) 0.25% -1:200000 IJ SOLN
INTRAMUSCULAR | Status: AC
  Filled 2018-10-03: qty 30

## 2018-10-03 MED ORDER — SODIUM CHLORIDE 0.9 % IV SOLN: 250.0000 mL | INTRAVENOUS | Status: DC | PRN

## 2018-10-03 MED ORDER — PROPOFOL 10 MG/ML IV BOLUS
INTRAVENOUS | Status: DC | PRN
  Administered 2018-10-03: 200 mg via INTRAVENOUS
  Administered 2018-10-03: 40 mg via INTRAVENOUS

## 2018-10-03 MED ORDER — ONDANSETRON HCL 4 MG/2ML IJ SOLN
INTRAMUSCULAR | Status: DC | PRN
  Administered 2018-10-03: 4 mg via INTRAVENOUS

## 2018-10-03 MED ORDER — HYDROMORPHONE HCL 1 MG/ML IJ SOLN
0.2500 mg | INTRAMUSCULAR | Status: DC | PRN
  Administered 2018-10-03 (×2): 0.5 mg via INTRAVENOUS

## 2018-10-03 MED ORDER — CEFAZOLIN SODIUM-DEXTROSE 2-4 GM/100ML-% IV SOLN
INTRAVENOUS | Status: AC
  Filled 2018-10-03: qty 100

## 2018-10-03 MED ORDER — SODIUM CHLORIDE (PF) 0.9 % IJ SOLN
INTRAMUSCULAR | Status: DC | PRN
  Administered 2018-10-03: 60 mL

## 2018-10-03 MED ORDER — ACETAMINOPHEN 650 MG RE SUPP: 650.0000 mg | RECTAL | Status: DC | PRN

## 2018-10-03 MED ORDER — FENTANYL CITRATE (PF) 100 MCG/2ML IJ SOLN
50.0000 ug | INTRAMUSCULAR | Status: AC | PRN
  Administered 2018-10-03: 50 ug via INTRAVENOUS
  Administered 2018-10-03: 100 ug via INTRAVENOUS
  Administered 2018-10-03: 50 ug via INTRAVENOUS

## 2018-10-03 MED ORDER — OXYCODONE HCL 5 MG PO TABS
5.0000 mg | ORAL_TABLET | ORAL | Status: DC | PRN
  Administered 2018-10-03: 5 mg via ORAL

## 2018-10-03 MED ORDER — MEPERIDINE HCL 25 MG/ML IJ SOLN: 6.2500 mg | INTRAMUSCULAR | Status: DC | PRN

## 2018-10-03 MED ORDER — FENTANYL CITRATE (PF) 100 MCG/2ML IJ SOLN
INTRAMUSCULAR | Status: AC
  Filled 2018-10-03: qty 4

## 2018-10-03 MED ORDER — SODIUM CHLORIDE 0.9% FLUSH: 3.0000 mL | Freq: Two times a day (BID) | INTRAVENOUS | Status: DC

## 2018-10-03 MED ORDER — OXYCODONE HCL 5 MG PO TABS: 5.0000 mg | ORAL_TABLET | Freq: Once | ORAL | Status: DC | PRN

## 2018-10-03 MED ORDER — ROCURONIUM BROMIDE 100 MG/10ML IV SOLN
INTRAVENOUS | Status: DC | PRN
  Administered 2018-10-03: 50 mg via INTRAVENOUS
  Administered 2018-10-03: 10 mg via INTRAVENOUS

## 2018-10-03 MED ORDER — MIDAZOLAM HCL 2 MG/2ML IJ SOLN
INTRAMUSCULAR | Status: AC
  Filled 2018-10-03: qty 2

## 2018-10-03 MED ORDER — SCOPOLAMINE 1 MG/3DAYS TD PT72: 1.0000 | MEDICATED_PATCH | Freq: Once | TRANSDERMAL | Status: DC | PRN

## 2018-10-03 MED ORDER — PHENYLEPHRINE HCL 10 MG/ML IJ SOLN
INTRAMUSCULAR | Status: DC | PRN
  Administered 2018-10-03 (×3): 80 ug via INTRAVENOUS

## 2018-10-03 MED ORDER — OXYCODONE HCL 5 MG PO TABS
ORAL_TABLET | ORAL | Status: AC
  Filled 2018-10-03: qty 1

## 2018-10-03 MED ORDER — HYDROMORPHONE HCL 1 MG/ML IJ SOLN
INTRAMUSCULAR | Status: AC
  Filled 2018-10-03: qty 0.5

## 2018-10-03 MED ORDER — OXYCODONE HCL 5 MG/5ML PO SOLN: 5.0000 mg | Freq: Once | ORAL | Status: DC | PRN

## 2018-10-03 MED ORDER — CEFAZOLIN SODIUM-DEXTROSE 2-4 GM/100ML-% IV SOLN
2.0000 g | INTRAVENOUS | Status: AC
  Administered 2018-10-03: 2 g via INTRAVENOUS

## 2018-10-03 MED ORDER — EPHEDRINE SULFATE 50 MG/ML IJ SOLN
INTRAMUSCULAR | Status: DC | PRN
  Administered 2018-10-03 (×3): 10 mg via INTRAVENOUS

## 2018-10-03 MED ORDER — SODIUM CHLORIDE 0.9 % IV SOLN
INTRAVENOUS | Status: DC | PRN
  Administered 2018-10-03 (×2): 500 mL

## 2018-10-03 MED ORDER — DEXAMETHASONE SODIUM PHOSPHATE 4 MG/ML IJ SOLN
INTRAMUSCULAR | Status: DC | PRN
  Administered 2018-10-03: 10 mg via INTRAVENOUS

## 2018-10-03 MED ORDER — SUGAMMADEX SODIUM 200 MG/2ML IV SOLN
INTRAVENOUS | Status: DC | PRN
  Administered 2018-10-03: 250 mg via INTRAVENOUS

## 2018-10-03 MED ORDER — LIDOCAINE-EPINEPHRINE 1 %-1:100000 IJ SOLN
INTRAMUSCULAR | Status: DC | PRN
  Administered 2018-10-03: 20 mL

## 2018-10-03 MED ORDER — LIDOCAINE HCL (CARDIAC) PF 100 MG/5ML IV SOSY
PREFILLED_SYRINGE | INTRAVENOUS | Status: DC | PRN
  Administered 2018-10-03: 100 mg via INTRAVENOUS

## 2018-10-03 MED ORDER — LACTATED RINGERS IV SOLN
INTRAVENOUS | Status: DC
  Administered 2018-10-03 (×3): via INTRAVENOUS

## 2018-10-03 MED ORDER — BUPIVACAINE-EPINEPHRINE 0.25% -1:200000 IJ SOLN
INTRAMUSCULAR | Status: DC | PRN
  Administered 2018-10-03: 20 mL

## 2018-10-03 MED ORDER — ACETAMINOPHEN 325 MG PO TABS: 650.0000 mg | ORAL_TABLET | ORAL | Status: DC | PRN

## 2018-10-03 MED ORDER — PROMETHAZINE HCL 25 MG/ML IJ SOLN: 6.2500 mg | INTRAMUSCULAR | Status: DC | PRN

## 2018-10-03 MED ORDER — SODIUM CHLORIDE (PF) 0.9 % IJ SOLN
INTRAMUSCULAR | Status: AC
  Filled 2018-10-03: qty 60

## 2018-10-03 SURGICAL SUPPLY — 64 items
ADH SKN CLS APL DERMABOND .7 (GAUZE/BANDAGES/DRESSINGS) ×4
BAG DECANTER FOR FLEXI CONT (MISCELLANEOUS) ×4 IMPLANT
BINDER BREAST LRG (GAUZE/BANDAGES/DRESSINGS) IMPLANT
BINDER BREAST MEDIUM (GAUZE/BANDAGES/DRESSINGS) IMPLANT
BINDER BREAST XLRG (GAUZE/BANDAGES/DRESSINGS) ×2 IMPLANT
BINDER BREAST XXLRG (GAUZE/BANDAGES/DRESSINGS) IMPLANT
BLADE HEX COATED 2.75 (ELECTRODE) ×3 IMPLANT
BLADE SURG 10 STRL SS (BLADE) ×7 IMPLANT
BLADE SURG 15 STRL LF DISP TIS (BLADE) ×1 IMPLANT
BLADE SURG 15 STRL SS (BLADE)
BNDG GAUZE ELAST 4 BULKY (GAUZE/BANDAGES/DRESSINGS) ×2 IMPLANT
CANISTER SUCT 1200ML W/VALVE (MISCELLANEOUS) ×3 IMPLANT
CHLORAPREP W/TINT 26ML (MISCELLANEOUS) ×5 IMPLANT
COVER BACK TABLE 60X90IN (DRAPES) ×3 IMPLANT
COVER MAYO STAND STRL (DRAPES) ×3 IMPLANT
COVER WAND RF STERILE (DRAPES) IMPLANT
DECANTER SPIKE VIAL GLASS SM (MISCELLANEOUS) IMPLANT
DERMABOND ADVANCED (GAUZE/BANDAGES/DRESSINGS) ×2
DERMABOND ADVANCED .7 DNX12 (GAUZE/BANDAGES/DRESSINGS) ×2 IMPLANT
DRAIN CHANNEL 19F RND (DRAIN) IMPLANT
DRAPE LAPAROSCOPIC ABDOMINAL (DRAPES) ×3 IMPLANT
DRSG PAD ABDOMINAL 8X10 ST (GAUZE/BANDAGES/DRESSINGS) ×6 IMPLANT
ELECT BLADE 4.0 EZ CLEAN MEGAD (MISCELLANEOUS) ×3
ELECT REM PT RETURN 9FT ADLT (ELECTROSURGICAL) ×3
ELECTRODE BLDE 4.0 EZ CLN MEGD (MISCELLANEOUS) ×1 IMPLANT
ELECTRODE REM PT RTRN 9FT ADLT (ELECTROSURGICAL) ×2 IMPLANT
EVACUATOR SILICONE 100CC (DRAIN) IMPLANT
GAUZE SPONGE 4X4 12PLY STRL (GAUZE/BANDAGES/DRESSINGS) IMPLANT
GAUZE SPONGE 4X4 12PLY STRL LF (GAUZE/BANDAGES/DRESSINGS) IMPLANT
GLOVE BIO SURGEON STRL SZ 6.5 (GLOVE) ×9 IMPLANT
GLOVE BIOGEL PI IND STRL 6.5 (GLOVE) ×1 IMPLANT
GLOVE BIOGEL PI IND STRL 7.0 (GLOVE) ×1 IMPLANT
GLOVE BIOGEL PI INDICATOR 6.5 (GLOVE) ×1
GLOVE BIOGEL PI INDICATOR 7.0 (GLOVE) ×1
GLOVE SURG SS PI 6.5 STRL IVOR (GLOVE) ×2 IMPLANT
GOWN STRL REUS W/ TWL LRG LVL3 (GOWN DISPOSABLE) ×7 IMPLANT
GOWN STRL REUS W/TWL LRG LVL3 (GOWN DISPOSABLE) ×15
NDL HYPO 25X1 1.5 SAFETY (NEEDLE) ×1 IMPLANT
NEEDLE HYPO 25X1 1.5 SAFETY (NEEDLE) ×3 IMPLANT
NS IRRIG 1000ML POUR BTL (IV SOLUTION) IMPLANT
PACK BASIN DAY SURGERY FS (CUSTOM PROCEDURE TRAY) ×3 IMPLANT
PENCIL BUTTON HOLSTER BLD 10FT (ELECTRODE) ×3 IMPLANT
PIN SAFETY STERILE (MISCELLANEOUS) IMPLANT
SLEEVE SCD COMPRESS KNEE MED (MISCELLANEOUS) ×3 IMPLANT
SPONGE LAP 18X18 RF (DISPOSABLE) ×6 IMPLANT
STRIP CLOSURE SKIN 1/2X4 (GAUZE/BANDAGES/DRESSINGS) ×1 IMPLANT
STRIP SUTURE WOUND CLOSURE 1/2 (SUTURE) ×7 IMPLANT
SUT MNCRL AB 4-0 PS2 18 (SUTURE) ×13 IMPLANT
SUT MON AB 3-0 SH 27 (SUTURE) ×12
SUT MON AB 3-0 SH27 (SUTURE) ×5 IMPLANT
SUT MON AB 4-0 PS1 27 (SUTURE) ×1 IMPLANT
SUT MON AB 5-0 PS2 18 (SUTURE) ×9 IMPLANT
SUT SILK 3 0 PS 1 (SUTURE) IMPLANT
SUT VIC AB 3-0 SH 27 (SUTURE)
SUT VIC AB 3-0 SH 27X BRD (SUTURE) ×1 IMPLANT
SYR 50ML LL SCALE MARK (SYRINGE) ×2 IMPLANT
SYR BULB IRRIGATION 50ML (SYRINGE) ×3 IMPLANT
SYR CONTROL 10ML LL (SYRINGE) ×3 IMPLANT
TAPE MEASURE VINYL STERILE (MISCELLANEOUS) ×1 IMPLANT
TOWEL GREEN STERILE FF (TOWEL DISPOSABLE) ×6 IMPLANT
TUBE CONNECTING 20X1/4 (TUBING) ×3 IMPLANT
TUBING SET GRADUATE ASPIR 12FT (MISCELLANEOUS) ×2 IMPLANT
UNDERPAD 30X30 (UNDERPADS AND DIAPERS) ×6 IMPLANT
YANKAUER SUCT BULB TIP NO VENT (SUCTIONS) ×3 IMPLANT

## 2018-10-03 NOTE — Op Note (Addendum)
Breast Reduction Op note:    DATE OF PROCEDURE: 10/03/2018  LOCATION: [facility]  SURGEON: Alan Ripper Sanger , DO  ASSISTANT: Anette Riedel, PA, Enedina Finner, RNFA  PREOPERATIVE DIAGNOSIS 1. Macromastia 2. Neck Pain 3. Back Pain  POSTOPERATIVE DIAGNOSIS 1. Macromastia 2. Neck Pain 3. Back Pain  PROCEDURES 1. Bilateral breast reduction.  Right reduction 791g, Left reduction 657 g  COMPLICATIONS: None.  DRAINS: none  INDICATIONS FOR PROCEDURE Oyinkansola Dronet is a 47 y.o. year-old female born on 1971-04-24,with a history of symptomatic macromastia with concominant back pain, neck pain, shoulder grooving from her bra.   MRN: [id number]  CONSENT Informed consent was obtained directly from the patient. The risks, benefits and alternatives were fully discussed. Specific risks including but not limited to bleeding, infection, hematoma, seroma, scarring, pain, nipple necrosis, asymmetry, poor cosmetic results, and need for further surgery were discussed. The patient had ample opportunity to have her questions answered to her satisfaction.  DESCRIPTION OF PROCEDURE  Patient was brought into the operating room and placed in a supine position.  SCDs were placed and appropriate padding was performed.  Antibiotics were given. The patient underwent general anesthesia and the chest was prepped and draped in a sterile fashion.  A timeout was performed and all information was confirmed to be correct. Tumescent was placed in the lower lateral portion of each breast.    Right side: Preoperative markings were confirmed.  Incision lines were injected with 1% Xylocaine with epinephrine.  After waiting for vasoconstriction, the marked lines were incised.  A Wise-pattern superomedial breast reduction was performed by de-epithelializing the pedicle, using bovie to create the superomedial pedicle, and removing breast tissue from the superior, lateral, and inferior portions of the  breast.  Care was taken to not undermine the breast pedicle. Hemostasis was achieved.  The nipple was gently rotated into position and the soft tissue closed with 4-0 Monocryl.   Liposuction was done laterally to improve contour. Prior to lipo the total 433 gm. The pocket was irrigated and hemostasis confirmed.  The deep tissues were approximated with 3-0 Monocryl sutures and the skin was closed with deep dermal and subcuticular 4-0 Monocryl sutures.  The nipple and skin flaps had good capillary refill at the end of the procedure.    Left side: Preoperative markings were confirmed.  Incision lines were injected with 1% Xylocaine with epinephrine.  After waiting for vasoconstriction, the marked lines were incised.  A Wise-pattern superomedial breast reduction was performed by de-epithelializing the pedicle, using bovie to create the superomedial pedicle, and removing breast tissue from the superior, lateral, and inferior portions of the breast.  Care was taken to not undermine the breast pedicle. Hemostasis was achieved.  The nipple was gently rotated into position and the soft tissue was closed with 4-0 Monocryl.  The patient was sat upright and size and shape symmetry was confirmed.  The pocket was irrigated and hemostasis confirmed.  Liposuction was done laterally to improve contour. Prior to lipo the total was 307 gm.The deep tissues were approximated with 3-0 monocryl sutures and the skin was closed with deep dermal and subcuticular 4-0 Monocryl sutures.  Dermabond was applied. The RN first assistant assisted throughout the case.  The RN first assistant was essential in retraction and counter traction when needed to make the case progress smoothly.  This retraction and assistance made it possible to see the tissue plans for the procedure.  The assistance was needed for blood control, tissue re-approximation and  assisted with closure of the incision site.  A breast binder and ABDs were placed.  The nipple and  skin flaps had good capillary refill at the end of the procedure.  The patient tolerated the procedure well. The patient was allowed to wake from anesthesia and taken to the recovery room in satisfactory condition

## 2018-10-03 NOTE — Anesthesia Procedure Notes (Signed)
Procedure Name: Intubation Date/Time: 10/03/2018 12:34 PM Performed by: Maryella Shivers, CRNA Pre-anesthesia Checklist: Patient identified, Emergency Drugs available, Suction available and Patient being monitored Patient Re-evaluated:Patient Re-evaluated prior to induction Oxygen Delivery Method: Circle system utilized Preoxygenation: Pre-oxygenation with 100% oxygen Induction Type: IV induction Ventilation: Mask ventilation without difficulty Laryngoscope Size: Mac and 3 Grade View: Grade I Tube type: Oral Tube size: 7.0 mm Number of attempts: 1 Airway Equipment and Method: Stylet and Oral airway Placement Confirmation: ETT inserted through vocal cords under direct vision,  positive ETCO2 and breath sounds checked- equal and bilateral Secured at: 20 cm Tube secured with: Tape Dental Injury: Teeth and Oropharynx as per pre-operative assessment

## 2018-10-03 NOTE — Transfer of Care (Signed)
Immediate Anesthesia Transfer of Care Note  Patient: [name] Post  Procedure(s) Performed: BILATERAL BREAST REDUCTION (Bilateral Breast)  Patient Location: PACU  Anesthesia Type:General  Level of Consciousness: awake, alert  and oriented  Airway & Oxygen Therapy: Patient Spontanous Breathing and Patient connected to face mask oxygen  Post-op Assessment: Report given to RN and Post -op Vital signs reviewed and stable  Post vital signs: Reviewed and stable  Last Vitals:  Vitals Value Taken Time  BP 116/68 10/03/2018  3:09 PM  Temp    Pulse 119 10/03/2018  3:12 PM  Resp 22 10/03/2018  3:12 PM  SpO2 100 % 10/03/2018  3:12 PM  Vitals shown include unvalidated device data.  Last Pain:  Vitals:   10/03/18 1142  TempSrc: Oral  PainSc: 0-No pain         Complications: No apparent anesthesia complications

## 2018-10-03 NOTE — Discharge Instructions (Addendum)
INSTRUCTIONS FOR AFTER SURGERY ° ° °You are having surgery.  You will likely have some questions about what to expect following your operation.  The following information will help you and your family understand what to expect when you are discharged from the hospital.  Following these guidelines will help ensure a smooth recovery and reduce risks of complications.   °Postoperative instructions include information on: diet, wound care, medications and physical activity. ° °AFTER SURGERY °Expect to go home after the procedure.  In some cases, you may need to spend one night in the hospital for observation. ° °DIET °This surgery does not require a specific diet.  However, I have to mention that the healthier you eat the better your body can start healing. It is important to increasing your protein intake.  This means limiting the foods with sugar and carbohydrates.  Focus on vegetables and some meat.  If you have any liposuction during your procedure be sure to drink water.  If your urine is bright yellow, then it is concentrated, and you need to drink more water.  As a general rule after surgery, you should have 8 ounces of water every hour while awake.  If you find you are persistently nauseated or unable to take in liquids let us know.  NO TOBACCO USE or EXPOSURE.  This will slow your healing process and increase the risk of a wound. ° °WOUND CARE °You can shower the day after surgery if you don't have a drain.  Use fragrance free soap.  Dial, Dove and Ivory are usually mild on the skin. If you have a drain clean with baby wipes until the drain is removed.  If you have steri-strips / tape directly attached to your skin leave them in place. It is OK to get these wet.  No baths, pools or hot tubs for two weeks. °We close your incision to leave the smallest and best-looking scar. No ointment or creams on your incisions until given the go ahead.  Especially not Neosporin (Too many skin reactions with this one).  A few  weeks after surgery you can use Mederma and start massaging the scar. °We ask you to wear your binder or sports bra for the first 6 weeks around the clock, including while sleeping. This provides added comfort and helps reduce the fluid accumulation at the surgery site. ° °ACTIVITY °No heavy lifting until cleared by the doctor.  It is OK to walk and climb stairs. In fact, moving your legs is very important to decrease your risk of a blood clot.  It will also help keep you from getting deconditioned.  Every 1 to 2 hours get up and walk for 5 minutes. This will help with a quicker recovery back to normal.  Let pain be your guide so you don't do too much.  NO, you cannot do the spring cleaning and don't plan on taking care of anyone else.  This is your time for TLC.  °You will be more comfortable if you sleep and rest with your head elevated either with a few pillows under you or in a recliner.  No stomach sleeping for a few months. ° °WORK °Everyone returns to work at different times. As a rough guide, most people take at least 1 - 2 weeks off prior to returning to work. If you need documentation for your job, bring the forms to your postoperative follow up visit. ° °DRIVING °Arrange for someone to bring you home from the hospital.  You   may be able to drive a few days after surgery but not while taking any narcotics or valium. ° °BOWEL MOVEMENTS °Constipation can occur after anesthesia and while taking pain medication.  It is important to stay ahead for your comfort.  We recommend taking Milk of Magnesia (2 tablespoons; twice a day) while taking the pain pills. ° °SEROMA °This is fluid your body tried to put in the surgical site.  This is normal but if it creates tight skinny skin let us know.  It usually decreases in a few weeks. ° °WHEN TO CALL °Call your surgeon's office if any of the following occur: °• Fever 101 degrees F or greater °• Excessive bleeding or fluid from the incision site. °• Pain that increases  over time without aid from the medications °• Redness, warmth, or pus draining from incision sites °• Persistent nausea or inability to take in liquids °• Severe misshapen area that underwent the operation. ° °] ° ° ° ° ° °Post Anesthesia Home Care Instructions ° °Activity: °Get plenty of rest for the remainder of the day. A responsible individual must stay with you for 24 hours following the procedure.  °For the next 24 hours, DO NOT: °-Drive a car °-Operate machinery °-Drink alcoholic beverages °-Take any medication unless instructed by your physician °-Make any legal decisions or sign important papers. ° °Meals: °Start with liquid foods such as gelatin or soup. Progress to regular foods as tolerated. Avoid greasy, spicy, heavy foods. If nausea and/or vomiting occur, drink only clear liquids until the nausea and/or vomiting subsides. Call your physician if vomiting continues. ° °Special Instructions/Symptoms: °Your throat may feel dry or sore from the anesthesia or the breathing tube placed in your throat during surgery. If this causes discomfort, gargle with warm salt water. The discomfort should disappear within 24 hours. ° °If you had a scopolamine patch placed behind your ear for the management of post- operative nausea and/or vomiting: ° °1. The medication in the patch is effective for 72 hours, after which it should be removed.  Wrap patch in a tissue and discard in the trash. Wash hands thoroughly with soap and water. °2. You may remove the patch earlier than 72 hours if you experience unpleasant side effects which may include dry mouth, dizziness or visual disturbances. °3. Avoid touching the patch. Wash your hands with soap and water after contact with the patch. °   ° °

## 2018-10-03 NOTE — Interval H&P Note (Signed)
History and Physical Interval Note:  10/03/2018 11:29 AM  [name]  has presented today for surgery, with the diagnosis of Neck pain; Chronic bilateral thoracic back pain; Symptomatic mammary hypertrophy  The various methods of treatment have been discussed with the patient and family. After consideration of risks, benefits and other options for treatment, the patient has consented to  Procedure(s): BILATERAL BREAST REDUCTION (Bilateral) as a surgical intervention .  The patient's history has been reviewed, patient examined, no change in status, stable for surgery.  I have reviewed the patient's chart and labs.  Questions were answered to the patient's satisfaction.     Loel Lofty Ladainian Therien

## 2018-10-03 NOTE — Anesthesia Preprocedure Evaluation (Signed)
Anesthesia Evaluation  Patient identified by MRN, date of birth, ID band Patient awake    Reviewed: Allergy & Precautions, NPO status , Patient's Chart, lab work & pertinent test results  Airway Mallampati: II  TM Distance: >3 FB Neck ROM: Full    Dental  (+) Teeth Intact, Dental Advisory Given   Pulmonary neg pulmonary ROS,    breath sounds clear to auscultation       Cardiovascular negative cardio ROS   Rhythm:Regular Rate:Normal     Neuro/Psych  Headaches, PSYCHIATRIC DISORDERS Anxiety Depression  Neuromuscular disease    GI/Hepatic negative GI ROS, Neg liver ROS,   Endo/Other  negative endocrine ROS  Renal/GU negative Renal ROS     Musculoskeletal negative musculoskeletal ROS (+) Arthritis , Fibromyalgia -  Abdominal   Peds  Hematology negative hematology ROS (+)   Anesthesia Other Findings   Reproductive/Obstetrics negative OB ROS                             Anesthesia Physical  Anesthesia Plan  ASA: II  Anesthesia Plan: General   Post-op Pain Management:    Induction: Intravenous  PONV Risk Score and Plan: 4 or greater and Ondansetron, Dexamethasone, Midazolam and Treatment may vary due to age or medical condition  Airway Management Planned: Oral ETT  Additional Equipment: None  Intra-op Plan:   Post-operative Plan: Extubation in OR  Informed Consent: I have reviewed the patients History and Physical, chart, labs and discussed the procedure including the risks, benefits and alternatives for the proposed anesthesia with the patient or authorized representative who has indicated his/her understanding and acceptance.   Dental advisory given  Plan Discussed with: CRNA  Anesthesia Plan Comments:         Anesthesia Quick Evaluation

## 2018-10-04 ENCOUNTER — Other Ambulatory Visit: Payer: Self-pay | Admitting: Obstetrics and Gynecology

## 2018-10-04 ENCOUNTER — Encounter (HOSPITAL_BASED_OUTPATIENT_CLINIC_OR_DEPARTMENT_OTHER): Payer: Self-pay | Admitting: Plastic Surgery

## 2018-10-04 DIAGNOSIS — R928 Other abnormal and inconclusive findings on diagnostic imaging of breast: Secondary | ICD-10-CM

## 2018-10-05 NOTE — Anesthesia Postprocedure Evaluation (Signed)
Anesthesia Scott Note  Patient: [name]  Procedure(s) Performed: BILATERAL BREAST REDUCTION (Bilateral Breast)     Patient location during evaluation: PACU Anesthesia Type: General Level of consciousness: sedated and patient cooperative Pain management: pain level controlled Vital Signs Assessment: Scott-procedure vital signs reviewed and stable Respiratory status: spontaneous breathing Cardiovascular status: stable Anesthetic complications: no    Last Vitals:  Vitals:   10/03/18 1545 10/03/18 1605  BP: 133/89 132/90  Pulse: (!) 108 (!) 112  Resp: 11 20  Temp:  36.8 C  SpO2: 99% 98%    Last Pain:  Vitals:   10/04/18 1117  TempSrc:   PainSc: [place]

## 2018-10-09 ENCOUNTER — Encounter: Payer: Self-pay | Admitting: Plastic Surgery

## 2018-10-09 ENCOUNTER — Ambulatory Visit (INDEPENDENT_AMBULATORY_CARE_PROVIDER_SITE_OTHER): Payer: 59 | Admitting: Plastic Surgery

## 2018-10-09 VITALS — BP 96/62 | HR 104 | Ht 62.0 in | Wt 176.2 lb

## 2018-10-09 DIAGNOSIS — N62 Hypertrophy of breast: Secondary | ICD-10-CM

## 2018-10-09 MED ORDER — HYDROCODONE-ACETAMINOPHEN 5-325 MG PO TABS: 1.0000 | ORAL_TABLET | Freq: Four times a day (QID) | ORAL | 0 refills | Status: AC | PRN

## 2018-10-09 NOTE — Progress Notes (Signed)
Subjective:    Patient ID: [name], female    DOB: 04/24/1971, 47 y.o.   MRN: [id number]  [name] is a 47 year old female here for a postoperative follow-up on her breast reduction bilaterally.  She is pleased with the results.  She does not have signs of a hematoma.  The nipple areaola has good color.  She has noticed some itching around the area.  No sign of infection.  She is pleased with the size she has.  Bruising laterally and at the axilla which is as expected for the liposuction.  Her pain is well controlled and she is doing pretty good with eating healthy.   Review of Systems  Constitutional: Negative.   HENT: Negative.   Respiratory: Negative.   Gastrointestinal: Negative.   Genitourinary: Negative.   Musculoskeletal: Negative.        Objective:   Physical Exam Vitals signs and nursing note reviewed.  HENT:     Head: Normocephalic.  Cardiovascular:     Rate and Rhythm: Normal rate.  Pulmonary:     Effort: Pulmonary effort is normal.  Neurological:     General: No focal deficit present.     Mental Status: She is alert.  Psychiatric:        Mood and Affect: Mood normal.        Thought Content: Thought content normal.        Judgment: Judgment normal.        Assessment & Plan:  Symptomatic mammary hypertrophy Recommend wearing a sports bra.  Can shower.  Light activity and no repetitive movement.

## 2018-10-11 ENCOUNTER — Telehealth: Payer: Self-pay | Admitting: Plastic Surgery

## 2018-10-11 NOTE — Telephone Encounter (Signed)
Patient called inquiring about results from mammogram prior to surgery. Patient is wanting to know if she should still have diagnostic mammogram and ultrasound. Patient is going out of town tomorrow and will not be back until the beginning of the year, which is when her insurance changes. Patient is looking for a direction to go if she needs the additional imaging. Please advise.

## 2018-10-18 NOTE — Telephone Encounter (Signed)
Patient called again about possible pathology from mammogram prior to her surgery. Patient has appointment on Friday January 10th for follow up. Patient requesting call back.

## 2018-10-26 ENCOUNTER — Ambulatory Visit (INDEPENDENT_AMBULATORY_CARE_PROVIDER_SITE_OTHER): Payer: BLUE CROSS/BLUE SHIELD | Admitting: Physician Assistant

## 2018-10-26 ENCOUNTER — Encounter: Payer: Self-pay | Admitting: Physician Assistant

## 2018-10-26 VITALS — BP 132/86 | HR 98 | Temp 98.7°F | Ht 62.0 in | Wt 176.0 lb

## 2018-10-26 DIAGNOSIS — N62 Hypertrophy of breast: Secondary | ICD-10-CM

## 2018-10-26 NOTE — Progress Notes (Signed)
Subjective:     Patient ID: [name], female   DOB: 1971-04-12, 48 y.o.   MRN: [id number]  HPI Very pleasant 48 year old female pt presents to the clinic s/p breast reduction on 10/03/18.  The pt is overall very pleased about her outcome both aesthetically and function.  The pt says she has already noted a decrease in neck and back pain.  She reports she is walking straighter.  She has noted a "pulling" sensation and some discomfort of her right axilla. She did return to work last week but has not noted the discomfort has increased or decreased since. The MRI prior to surgery showed some distortion in the right breast and recommended follow up.  Review of Systems  Constitutional: Negative.   Respiratory: Negative.   Cardiovascular: Negative.   Gastrointestinal: Negative.   Psychiatric/Behavioral: Negative.        Objective:   Physical Exam Constitutional:      Appearance: Normal appearance. She is normal weight.  Pulmonary:     Effort: Pulmonary effort is normal.  Abdominal:     Palpations: Abdomen is soft.  Skin:    General: Skin is warm and dry.  Neurological:     Mental Status: She is alert and oriented to person, place, and time.  Psychiatric:        Mood and Affect: Mood normal.        Behavior: Behavior normal.        Thought Content: Thought content normal.        Judgment: Judgment normal.   no ecchymosis noted, no edema, incisions healing well, no sign of infection     Assessment:     S/p Bilateral breast reduction     Plan:     Our office will make the appt for the pt to have the [name] in follow up of the pre-op MRI We will see the pt back after the [name] The pt will return to [facility].  She has a relationship with their therapist and will request exercises to address the pectoralis discomfort

## 2018-10-27 DIAGNOSIS — J069 Acute upper respiratory infection, unspecified: Secondary | ICD-10-CM | POA: Diagnosis not present

## 2018-10-27 DIAGNOSIS — R05 Cough: Secondary | ICD-10-CM | POA: Diagnosis not present

## 2018-10-30 ENCOUNTER — Ambulatory Visit
Admission: RE | Admit: 2018-10-30 | Discharge: 2018-10-30 | Disposition: A | Discharge status: Home / Self Care | Payer: BLUE CROSS/BLUE SHIELD | Source: Ambulatory Visit | Attending: Obstetrics and Gynecology | Admitting: Obstetrics and Gynecology

## 2018-10-30 ENCOUNTER — Other Ambulatory Visit: Payer: Self-pay | Admitting: Obstetrics and Gynecology

## 2018-10-30 DIAGNOSIS — R928 Other abnormal and inconclusive findings on diagnostic imaging of breast: Secondary | ICD-10-CM

## 2018-10-30 DIAGNOSIS — N6311 Unspecified lump in the right breast, upper outer quadrant: Secondary | ICD-10-CM | POA: Diagnosis not present

## 2018-10-30 DIAGNOSIS — N6310 Unspecified lump in the right breast, unspecified quadrant: Secondary | ICD-10-CM

## 2018-11-02 DIAGNOSIS — F3342 Major depressive disorder, recurrent, in full remission: Secondary | ICD-10-CM | POA: Diagnosis not present

## 2018-11-02 DIAGNOSIS — F900 Attention-deficit hyperactivity disorder, predominantly inattentive type: Secondary | ICD-10-CM | POA: Diagnosis not present

## 2018-11-04 DIAGNOSIS — J209 Acute bronchitis, unspecified: Secondary | ICD-10-CM | POA: Diagnosis not present

## 2018-11-05 DIAGNOSIS — M797 Fibromyalgia: Secondary | ICD-10-CM | POA: Diagnosis not present

## 2018-11-05 IMAGING — US US ABDOMEN COMPLETE
1 series · 14 of 25 positions shown · non-contrast
Comparison: Abdomen CT dated 01/11/2016 and abdomen ultrasound
dated 10/08/2009.

CLINICAL DATA: Diffuse abdominal pain. Interval bowel syndrome. Low
back pain. Previous cholecystectomy. History of nephrolithiasis.

EXAM:
ABDOMEN ULTRASOUND COMPLETE

[Series 1: us abdomen complete · 0.20mm/px · 14 of 66 slices shown]
[im 1/66]
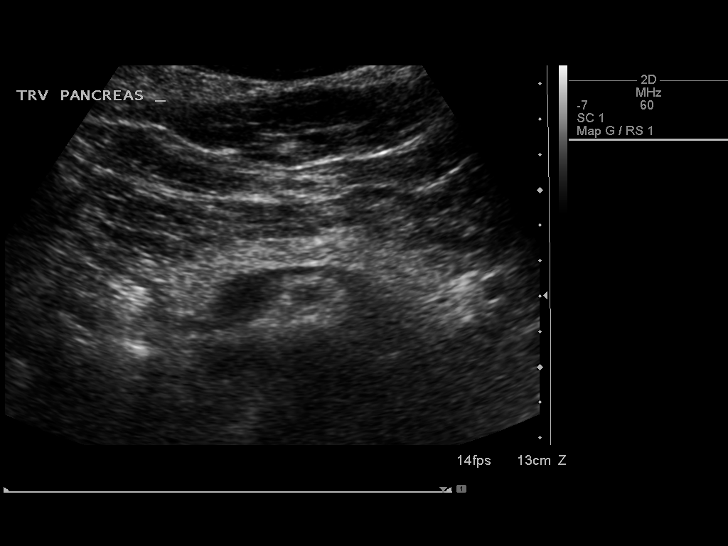
[im 6/66]
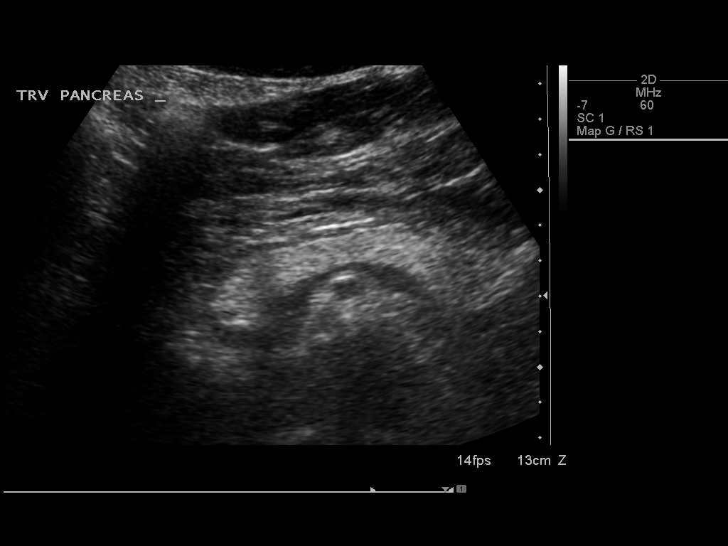
[im 11/66]
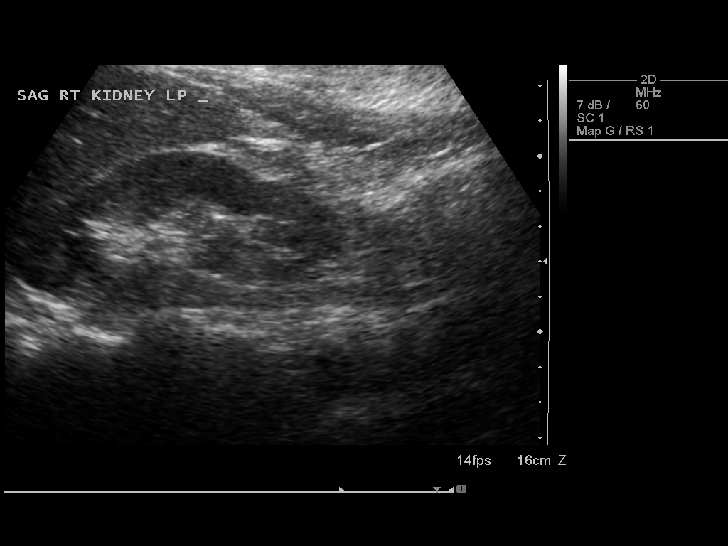
[im 17/66]
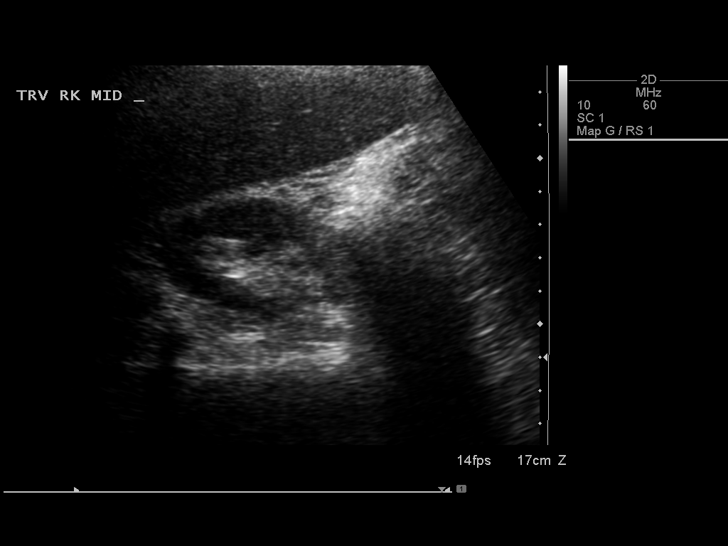
[im 22/66]
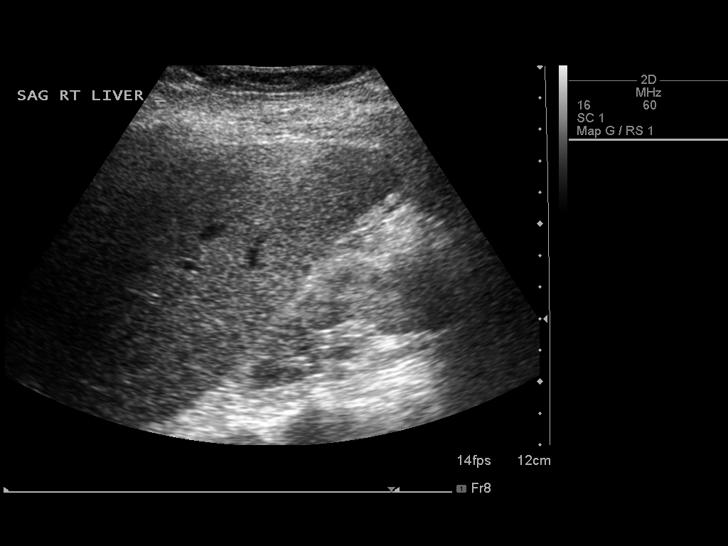
[im 25/66]
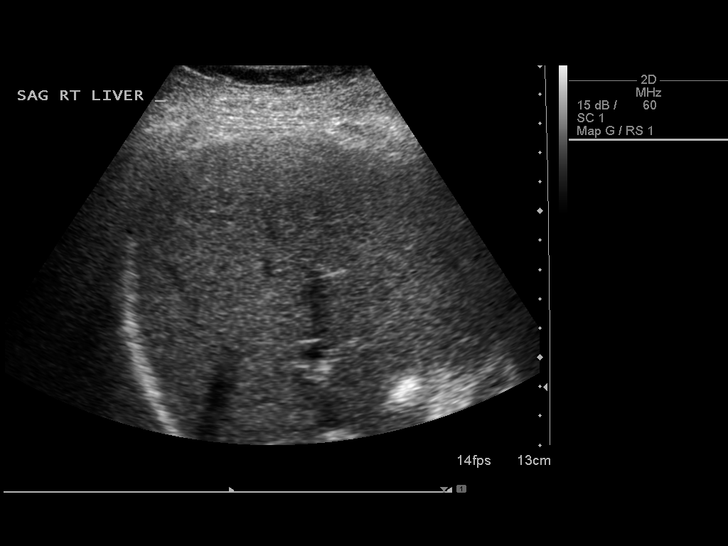
[im 30/66]
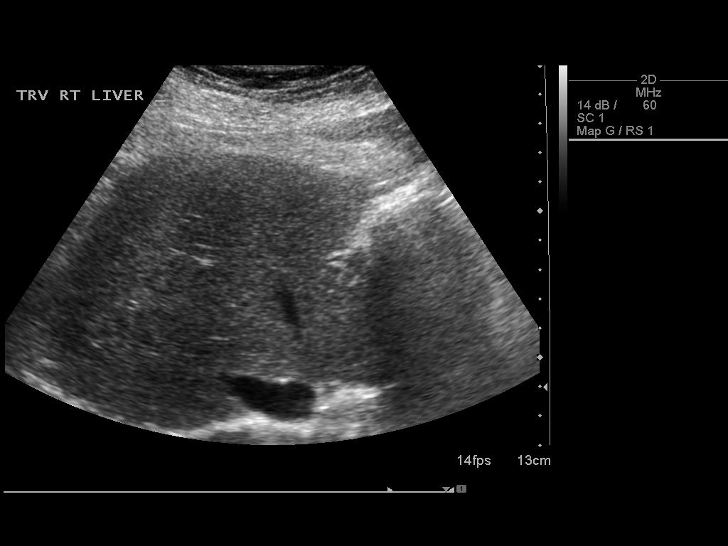
[im 36/66]
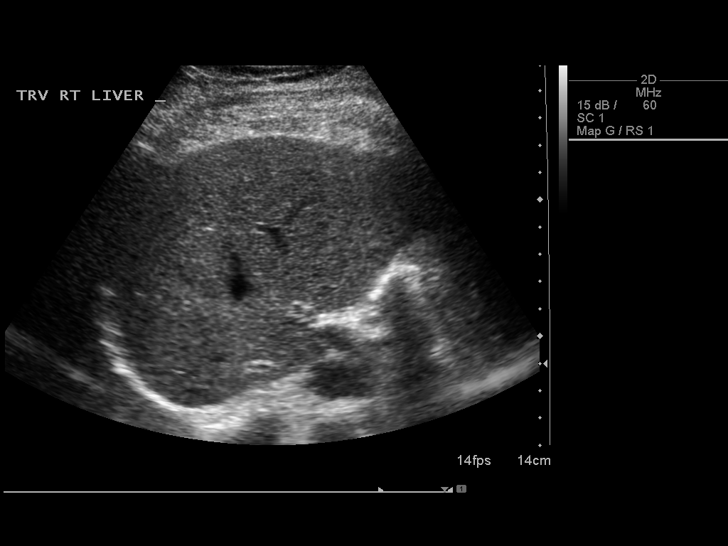
[im 41/66]
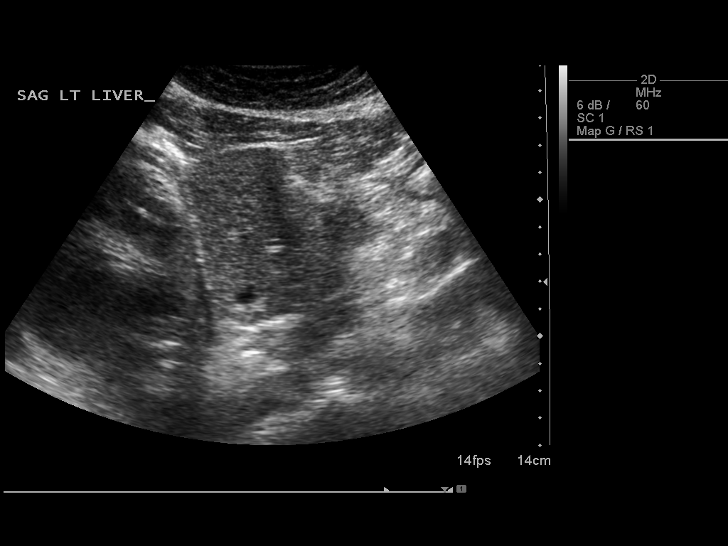
[im 44/66]
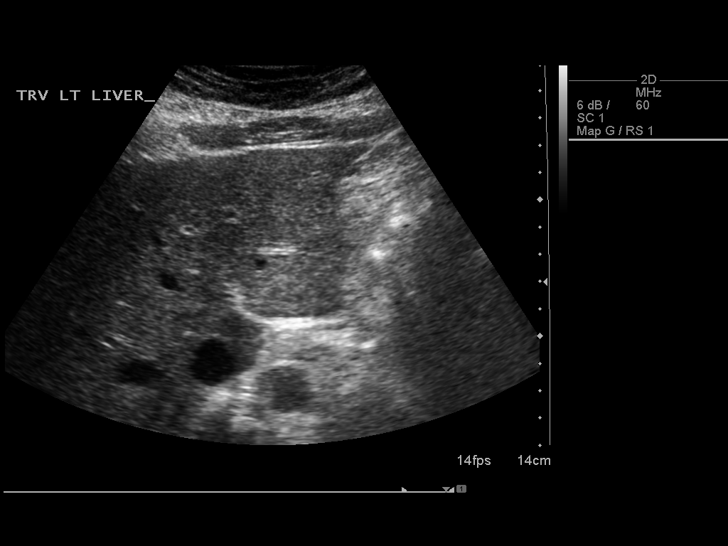
[im 49/66]
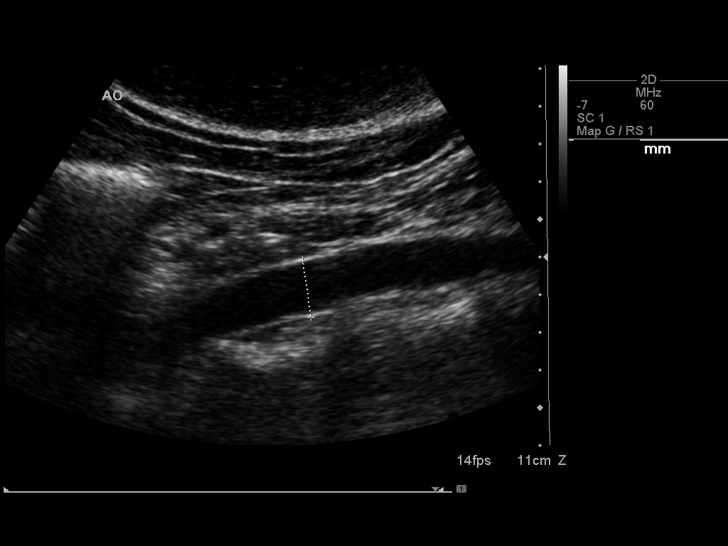
[im 55/66]
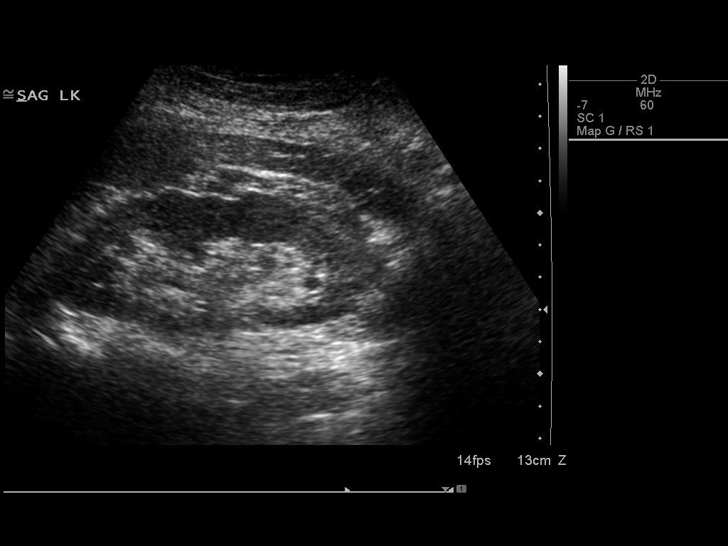
[im 60/66]
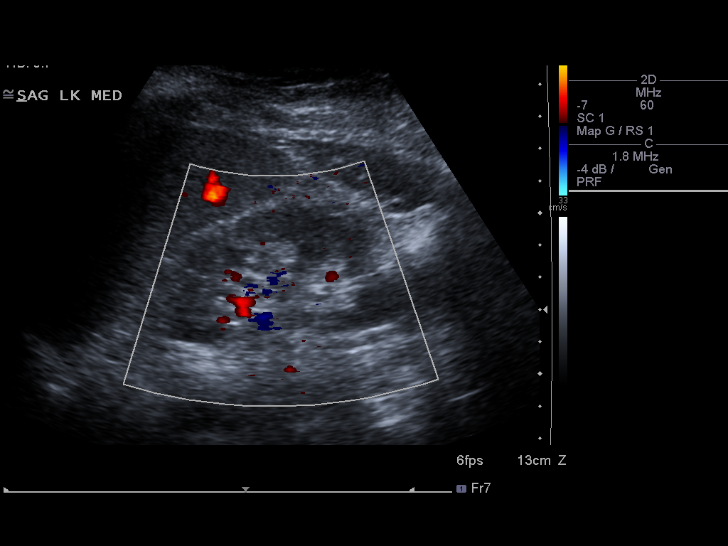
[im 66/66]
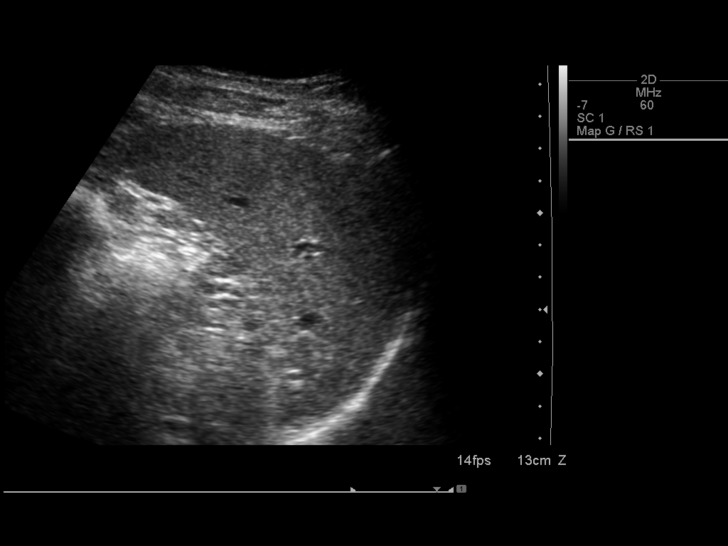

[14 of 25 positions shown; findings below may reference images not displayed]

FINDINGS: Gallbladder: Surgically absent.

Common bile duct: Diameter: 5.9 mm

Liver: No focal lesion identified. Within normal limits in
parenchymal echogenicity.

IVC: No abnormality visualized.

Pancreas: Visualized portion unremarkable.

Spleen: Size and appearance within normal limits.

Right Kidney: Length: 9.6 cm. Echogenicity within normal limits. No
mass or hydronephrosis visualized.

Left Kidney: Length: 10.4 cm. Echogenicity within normal limits. No
mass or hydronephrosis visualized.

Abdominal aorta: No aneurysm visualized.

Other findings: None.
IMPRESSION: Normal examination, status post cholecystectomy.

## 2018-11-08 DIAGNOSIS — E559 Vitamin D deficiency, unspecified: Secondary | ICD-10-CM | POA: Diagnosis not present

## 2018-11-08 DIAGNOSIS — R030 Elevated blood-pressure reading, without diagnosis of hypertension: Secondary | ICD-10-CM | POA: Diagnosis not present

## 2018-11-08 DIAGNOSIS — M797 Fibromyalgia: Secondary | ICD-10-CM | POA: Diagnosis not present

## 2018-11-08 DIAGNOSIS — Z0000 Encounter for general adult medical examination without abnormal findings: Secondary | ICD-10-CM | POA: Diagnosis not present

## 2018-11-08 DIAGNOSIS — R5383 Other fatigue: Secondary | ICD-10-CM | POA: Diagnosis not present

## 2018-11-12 DIAGNOSIS — M797 Fibromyalgia: Secondary | ICD-10-CM | POA: Diagnosis not present

## 2018-11-13 DIAGNOSIS — F418 Other specified anxiety disorders: Secondary | ICD-10-CM | POA: Diagnosis not present

## 2018-11-13 DIAGNOSIS — Z23 Encounter for immunization: Secondary | ICD-10-CM | POA: Diagnosis not present

## 2018-11-13 DIAGNOSIS — F325 Major depressive disorder, single episode, in full remission: Secondary | ICD-10-CM | POA: Diagnosis not present

## 2018-11-13 DIAGNOSIS — R413 Other amnesia: Secondary | ICD-10-CM | POA: Diagnosis not present

## 2018-11-13 DIAGNOSIS — Z0000 Encounter for general adult medical examination without abnormal findings: Secondary | ICD-10-CM | POA: Diagnosis not present

## 2018-11-13 DIAGNOSIS — F319 Bipolar disorder, unspecified: Secondary | ICD-10-CM | POA: Diagnosis not present

## 2018-11-13 DIAGNOSIS — Z1389 Encounter for screening for other disorder: Secondary | ICD-10-CM | POA: Diagnosis not present

## 2018-11-14 DIAGNOSIS — Z1212 Encounter for screening for malignant neoplasm of rectum: Secondary | ICD-10-CM | POA: Diagnosis not present

## 2018-11-21 DIAGNOSIS — M797 Fibromyalgia: Secondary | ICD-10-CM | POA: Diagnosis not present

## 2018-11-22 DIAGNOSIS — H04123 Dry eye syndrome of bilateral lacrimal glands: Secondary | ICD-10-CM | POA: Diagnosis not present

## 2018-11-27 DIAGNOSIS — F331 Major depressive disorder, recurrent, moderate: Secondary | ICD-10-CM | POA: Diagnosis not present

## 2018-11-27 DIAGNOSIS — F410 Panic disorder [episodic paroxysmal anxiety] without agoraphobia: Secondary | ICD-10-CM | POA: Diagnosis not present

## 2018-11-27 DIAGNOSIS — F411 Generalized anxiety disorder: Secondary | ICD-10-CM | POA: Diagnosis not present

## 2018-11-28 DIAGNOSIS — M797 Fibromyalgia: Secondary | ICD-10-CM | POA: Diagnosis not present

## 2018-12-04 ENCOUNTER — Encounter: Payer: Self-pay | Admitting: Plastic Surgery

## 2018-12-04 ENCOUNTER — Ambulatory Visit (INDEPENDENT_AMBULATORY_CARE_PROVIDER_SITE_OTHER): Payer: BLUE CROSS/BLUE SHIELD | Admitting: Plastic Surgery

## 2018-12-04 VITALS — BP 122/89 | HR 79 | Temp 97.6°F | Ht 62.0 in | Wt 172.0 lb

## 2018-12-04 DIAGNOSIS — N62 Hypertrophy of breast: Secondary | ICD-10-CM

## 2018-12-04 NOTE — Progress Notes (Signed)
Subjective:    Patient ID: [name], female    DOB: [birth year], 48 y.o.   MRN: [id number]  The patient is a 48 year old female here for follow-up after her bilateral breast reduction.  She had a concerning area on her mammogram that we have been following.  Her most recent mammogram and ultrasound from January was negative for malignancy.  There were some seromas noted as expected for after surgery.  All incisions are healing very well. There is no sign of infection and no seroma to drain.  Review of Systems  Constitutional: Negative.   HENT: Negative.   Eyes: Negative.   Respiratory: Negative.   Cardiovascular: Negative.   Gastrointestinal: Negative.   Endocrine: Negative.   Genitourinary: Negative.   Musculoskeletal: Negative.   Skin: Negative.  Negative for wound.       Objective:   Physical Exam Vitals signs and nursing note reviewed.  Constitutional:      Appearance: Normal appearance.  HENT:     Head: Normocephalic and atraumatic.  Cardiovascular:     Rate and Rhythm: Normal rate.  Pulmonary:     Effort: Pulmonary effort is normal.  Abdominal:     General: Abdomen is flat. There is no distension.  Neurological:     General: No focal deficit present.     Mental Status: She is alert.  Psychiatric:        Mood and Affect: Mood normal.        Thought Content: Thought content normal.        Judgment: Judgment normal.       Assessment & Plan:  Symptomatic mammary hypertrophy The patient is doing very well.  Take Continue with physical therapy.  Follow-up in the next few months

## 2018-12-05 DIAGNOSIS — M797 Fibromyalgia: Secondary | ICD-10-CM | POA: Diagnosis not present

## 2018-12-06 ENCOUNTER — Encounter: Payer: Self-pay | Admitting: Plastic Surgery

## 2018-12-12 DIAGNOSIS — M797 Fibromyalgia: Secondary | ICD-10-CM | POA: Diagnosis not present

## 2018-12-19 DIAGNOSIS — M797 Fibromyalgia: Secondary | ICD-10-CM | POA: Diagnosis not present

## 2018-12-24 DIAGNOSIS — F331 Major depressive disorder, recurrent, moderate: Secondary | ICD-10-CM | POA: Diagnosis not present

## 2018-12-24 DIAGNOSIS — F411 Generalized anxiety disorder: Secondary | ICD-10-CM | POA: Diagnosis not present

## 2018-12-24 DIAGNOSIS — F410 Panic disorder [episodic paroxysmal anxiety] without agoraphobia: Secondary | ICD-10-CM | POA: Diagnosis not present

## 2018-12-26 DIAGNOSIS — M797 Fibromyalgia: Secondary | ICD-10-CM | POA: Diagnosis not present

## 2019-01-01 DIAGNOSIS — F411 Generalized anxiety disorder: Secondary | ICD-10-CM | POA: Diagnosis not present

## 2019-01-01 DIAGNOSIS — F331 Major depressive disorder, recurrent, moderate: Secondary | ICD-10-CM | POA: Diagnosis not present

## 2019-01-01 DIAGNOSIS — F410 Panic disorder [episodic paroxysmal anxiety] without agoraphobia: Secondary | ICD-10-CM | POA: Diagnosis not present

## 2019-01-02 DIAGNOSIS — M797 Fibromyalgia: Secondary | ICD-10-CM | POA: Diagnosis not present

## 2019-01-08 DIAGNOSIS — F411 Generalized anxiety disorder: Secondary | ICD-10-CM | POA: Diagnosis not present

## 2019-01-08 DIAGNOSIS — F410 Panic disorder [episodic paroxysmal anxiety] without agoraphobia: Secondary | ICD-10-CM | POA: Diagnosis not present

## 2019-01-08 DIAGNOSIS — F331 Major depressive disorder, recurrent, moderate: Secondary | ICD-10-CM | POA: Diagnosis not present

## 2019-01-14 DIAGNOSIS — F331 Major depressive disorder, recurrent, moderate: Secondary | ICD-10-CM | POA: Diagnosis not present

## 2019-01-22 DIAGNOSIS — F411 Generalized anxiety disorder: Secondary | ICD-10-CM | POA: Diagnosis not present

## 2019-01-22 DIAGNOSIS — F331 Major depressive disorder, recurrent, moderate: Secondary | ICD-10-CM | POA: Diagnosis not present

## 2019-01-22 DIAGNOSIS — F410 Panic disorder [episodic paroxysmal anxiety] without agoraphobia: Secondary | ICD-10-CM | POA: Diagnosis not present

## 2019-01-30 DIAGNOSIS — F410 Panic disorder [episodic paroxysmal anxiety] without agoraphobia: Secondary | ICD-10-CM | POA: Diagnosis not present

## 2019-01-30 DIAGNOSIS — F331 Major depressive disorder, recurrent, moderate: Secondary | ICD-10-CM | POA: Diagnosis not present

## 2019-01-30 DIAGNOSIS — F411 Generalized anxiety disorder: Secondary | ICD-10-CM | POA: Diagnosis not present

## 2019-02-05 DIAGNOSIS — F411 Generalized anxiety disorder: Secondary | ICD-10-CM | POA: Diagnosis not present

## 2019-02-05 DIAGNOSIS — F331 Major depressive disorder, recurrent, moderate: Secondary | ICD-10-CM | POA: Diagnosis not present

## 2019-02-05 DIAGNOSIS — F410 Panic disorder [episodic paroxysmal anxiety] without agoraphobia: Secondary | ICD-10-CM | POA: Diagnosis not present

## 2019-02-12 DIAGNOSIS — F411 Generalized anxiety disorder: Secondary | ICD-10-CM | POA: Diagnosis not present

## 2019-02-12 DIAGNOSIS — F410 Panic disorder [episodic paroxysmal anxiety] without agoraphobia: Secondary | ICD-10-CM | POA: Diagnosis not present

## 2019-02-12 DIAGNOSIS — F331 Major depressive disorder, recurrent, moderate: Secondary | ICD-10-CM | POA: Diagnosis not present

## 2019-02-19 DIAGNOSIS — F411 Generalized anxiety disorder: Secondary | ICD-10-CM | POA: Diagnosis not present

## 2019-02-19 DIAGNOSIS — F331 Major depressive disorder, recurrent, moderate: Secondary | ICD-10-CM | POA: Diagnosis not present

## 2019-02-19 DIAGNOSIS — F410 Panic disorder [episodic paroxysmal anxiety] without agoraphobia: Secondary | ICD-10-CM | POA: Diagnosis not present

## 2019-02-26 DIAGNOSIS — F331 Major depressive disorder, recurrent, moderate: Secondary | ICD-10-CM | POA: Diagnosis not present

## 2019-02-26 DIAGNOSIS — F411 Generalized anxiety disorder: Secondary | ICD-10-CM | POA: Diagnosis not present

## 2019-02-26 DIAGNOSIS — F410 Panic disorder [episodic paroxysmal anxiety] without agoraphobia: Secondary | ICD-10-CM | POA: Diagnosis not present

## 2019-03-05 DIAGNOSIS — F331 Major depressive disorder, recurrent, moderate: Secondary | ICD-10-CM | POA: Diagnosis not present

## 2019-03-05 DIAGNOSIS — F410 Panic disorder [episodic paroxysmal anxiety] without agoraphobia: Secondary | ICD-10-CM | POA: Diagnosis not present

## 2019-03-05 DIAGNOSIS — F411 Generalized anxiety disorder: Secondary | ICD-10-CM | POA: Diagnosis not present

## 2019-03-12 DIAGNOSIS — F411 Generalized anxiety disorder: Secondary | ICD-10-CM | POA: Diagnosis not present

## 2019-03-12 DIAGNOSIS — F410 Panic disorder [episodic paroxysmal anxiety] without agoraphobia: Secondary | ICD-10-CM | POA: Diagnosis not present

## 2019-03-12 DIAGNOSIS — F331 Major depressive disorder, recurrent, moderate: Secondary | ICD-10-CM | POA: Diagnosis not present

## 2019-03-19 DIAGNOSIS — F411 Generalized anxiety disorder: Secondary | ICD-10-CM | POA: Diagnosis not present

## 2019-03-19 DIAGNOSIS — F331 Major depressive disorder, recurrent, moderate: Secondary | ICD-10-CM | POA: Diagnosis not present

## 2019-03-19 DIAGNOSIS — F410 Panic disorder [episodic paroxysmal anxiety] without agoraphobia: Secondary | ICD-10-CM | POA: Diagnosis not present

## 2019-03-26 DIAGNOSIS — F410 Panic disorder [episodic paroxysmal anxiety] without agoraphobia: Secondary | ICD-10-CM | POA: Diagnosis not present

## 2019-03-26 DIAGNOSIS — F331 Major depressive disorder, recurrent, moderate: Secondary | ICD-10-CM | POA: Diagnosis not present

## 2019-03-26 DIAGNOSIS — F411 Generalized anxiety disorder: Secondary | ICD-10-CM | POA: Diagnosis not present

## 2019-04-02 DIAGNOSIS — F410 Panic disorder [episodic paroxysmal anxiety] without agoraphobia: Secondary | ICD-10-CM | POA: Diagnosis not present

## 2019-04-02 DIAGNOSIS — F411 Generalized anxiety disorder: Secondary | ICD-10-CM | POA: Diagnosis not present

## 2019-04-02 DIAGNOSIS — F331 Major depressive disorder, recurrent, moderate: Secondary | ICD-10-CM | POA: Diagnosis not present

## 2019-04-10 DIAGNOSIS — F410 Panic disorder [episodic paroxysmal anxiety] without agoraphobia: Secondary | ICD-10-CM | POA: Diagnosis not present

## 2019-04-10 DIAGNOSIS — F411 Generalized anxiety disorder: Secondary | ICD-10-CM | POA: Diagnosis not present

## 2019-04-10 DIAGNOSIS — F331 Major depressive disorder, recurrent, moderate: Secondary | ICD-10-CM | POA: Diagnosis not present

## 2019-04-22 DIAGNOSIS — F314 Bipolar disorder, current episode depressed, severe, without psychotic features: Secondary | ICD-10-CM | POA: Diagnosis not present

## 2019-04-22 DIAGNOSIS — F331 Major depressive disorder, recurrent, moderate: Secondary | ICD-10-CM | POA: Diagnosis not present

## 2019-04-22 DIAGNOSIS — F900 Attention-deficit hyperactivity disorder, predominantly inattentive type: Secondary | ICD-10-CM | POA: Diagnosis not present

## 2019-04-23 DIAGNOSIS — F411 Generalized anxiety disorder: Secondary | ICD-10-CM | POA: Diagnosis not present

## 2019-04-23 DIAGNOSIS — F410 Panic disorder [episodic paroxysmal anxiety] without agoraphobia: Secondary | ICD-10-CM | POA: Diagnosis not present

## 2019-04-23 DIAGNOSIS — F331 Major depressive disorder, recurrent, moderate: Secondary | ICD-10-CM | POA: Diagnosis not present

## 2019-04-24 DIAGNOSIS — F332 Major depressive disorder, recurrent severe without psychotic features: Secondary | ICD-10-CM | POA: Diagnosis not present

## 2019-05-08 ENCOUNTER — Other Ambulatory Visit: Payer: BLUE CROSS/BLUE SHIELD

## 2019-05-14 DIAGNOSIS — F332 Major depressive disorder, recurrent severe without psychotic features: Secondary | ICD-10-CM | POA: Diagnosis not present

## 2019-05-16 DIAGNOSIS — F332 Major depressive disorder, recurrent severe without psychotic features: Secondary | ICD-10-CM | POA: Diagnosis not present

## 2019-05-20 DIAGNOSIS — F332 Major depressive disorder, recurrent severe without psychotic features: Secondary | ICD-10-CM | POA: Diagnosis not present

## 2019-05-27 DIAGNOSIS — F332 Major depressive disorder, recurrent severe without psychotic features: Secondary | ICD-10-CM | POA: Diagnosis not present

## 2019-05-28 DIAGNOSIS — F331 Major depressive disorder, recurrent, moderate: Secondary | ICD-10-CM | POA: Diagnosis not present

## 2019-05-28 DIAGNOSIS — F410 Panic disorder [episodic paroxysmal anxiety] without agoraphobia: Secondary | ICD-10-CM | POA: Diagnosis not present

## 2019-05-28 DIAGNOSIS — F411 Generalized anxiety disorder: Secondary | ICD-10-CM | POA: Diagnosis not present

## 2019-05-30 DIAGNOSIS — F332 Major depressive disorder, recurrent severe without psychotic features: Secondary | ICD-10-CM | POA: Diagnosis not present

## 2019-06-04 DIAGNOSIS — F332 Major depressive disorder, recurrent severe without psychotic features: Secondary | ICD-10-CM | POA: Diagnosis not present

## 2019-06-06 DIAGNOSIS — F332 Major depressive disorder, recurrent severe without psychotic features: Secondary | ICD-10-CM | POA: Diagnosis not present

## 2019-06-17 DIAGNOSIS — F332 Major depressive disorder, recurrent severe without psychotic features: Secondary | ICD-10-CM | POA: Diagnosis not present

## 2019-06-25 DIAGNOSIS — F332 Major depressive disorder, recurrent severe without psychotic features: Secondary | ICD-10-CM | POA: Diagnosis not present

## 2019-06-27 DIAGNOSIS — F411 Generalized anxiety disorder: Secondary | ICD-10-CM | POA: Diagnosis not present

## 2019-06-27 DIAGNOSIS — F331 Major depressive disorder, recurrent, moderate: Secondary | ICD-10-CM | POA: Diagnosis not present

## 2019-06-27 DIAGNOSIS — F410 Panic disorder [episodic paroxysmal anxiety] without agoraphobia: Secondary | ICD-10-CM | POA: Diagnosis not present

## 2019-07-02 DIAGNOSIS — F411 Generalized anxiety disorder: Secondary | ICD-10-CM | POA: Diagnosis not present

## 2019-07-02 DIAGNOSIS — F331 Major depressive disorder, recurrent, moderate: Secondary | ICD-10-CM | POA: Diagnosis not present

## 2019-07-02 DIAGNOSIS — F410 Panic disorder [episodic paroxysmal anxiety] without agoraphobia: Secondary | ICD-10-CM | POA: Diagnosis not present

## 2019-07-08 DIAGNOSIS — F411 Generalized anxiety disorder: Secondary | ICD-10-CM | POA: Diagnosis not present

## 2019-07-08 DIAGNOSIS — F410 Panic disorder [episodic paroxysmal anxiety] without agoraphobia: Secondary | ICD-10-CM | POA: Diagnosis not present

## 2019-07-08 DIAGNOSIS — F331 Major depressive disorder, recurrent, moderate: Secondary | ICD-10-CM | POA: Diagnosis not present

## 2019-07-11 DIAGNOSIS — F341 Dysthymic disorder: Secondary | ICD-10-CM | POA: Diagnosis not present

## 2019-07-11 DIAGNOSIS — F331 Major depressive disorder, recurrent, moderate: Secondary | ICD-10-CM | POA: Diagnosis not present

## 2019-07-22 DIAGNOSIS — M545 Low back pain: Secondary | ICD-10-CM | POA: Diagnosis not present

## 2019-07-22 DIAGNOSIS — M797 Fibromyalgia: Secondary | ICD-10-CM | POA: Diagnosis not present

## 2019-07-22 DIAGNOSIS — M25511 Pain in right shoulder: Secondary | ICD-10-CM | POA: Diagnosis not present

## 2019-07-22 DIAGNOSIS — M25512 Pain in left shoulder: Secondary | ICD-10-CM | POA: Diagnosis not present

## 2019-07-23 DIAGNOSIS — F332 Major depressive disorder, recurrent severe without psychotic features: Secondary | ICD-10-CM | POA: Diagnosis not present

## 2019-07-24 DIAGNOSIS — F331 Major depressive disorder, recurrent, moderate: Secondary | ICD-10-CM | POA: Diagnosis not present

## 2019-07-24 DIAGNOSIS — F411 Generalized anxiety disorder: Secondary | ICD-10-CM | POA: Diagnosis not present

## 2019-07-24 DIAGNOSIS — F410 Panic disorder [episodic paroxysmal anxiety] without agoraphobia: Secondary | ICD-10-CM | POA: Diagnosis not present

## 2019-07-29 DIAGNOSIS — F332 Major depressive disorder, recurrent severe without psychotic features: Secondary | ICD-10-CM | POA: Diagnosis not present

## 2019-07-30 DIAGNOSIS — F332 Major depressive disorder, recurrent severe without psychotic features: Secondary | ICD-10-CM | POA: Diagnosis not present

## 2019-07-31 DIAGNOSIS — F331 Major depressive disorder, recurrent, moderate: Secondary | ICD-10-CM | POA: Diagnosis not present

## 2019-07-31 DIAGNOSIS — F411 Generalized anxiety disorder: Secondary | ICD-10-CM | POA: Diagnosis not present

## 2019-07-31 DIAGNOSIS — F410 Panic disorder [episodic paroxysmal anxiety] without agoraphobia: Secondary | ICD-10-CM | POA: Diagnosis not present

## 2019-08-01 DIAGNOSIS — M797 Fibromyalgia: Secondary | ICD-10-CM | POA: Diagnosis not present

## 2019-08-01 DIAGNOSIS — M545 Low back pain: Secondary | ICD-10-CM | POA: Diagnosis not present

## 2019-08-01 DIAGNOSIS — M25511 Pain in right shoulder: Secondary | ICD-10-CM | POA: Diagnosis not present

## 2019-08-01 DIAGNOSIS — M25512 Pain in left shoulder: Secondary | ICD-10-CM | POA: Diagnosis not present

## 2019-08-05 DIAGNOSIS — M25512 Pain in left shoulder: Secondary | ICD-10-CM | POA: Diagnosis not present

## 2019-08-05 DIAGNOSIS — M25511 Pain in right shoulder: Secondary | ICD-10-CM | POA: Diagnosis not present

## 2019-08-05 DIAGNOSIS — M545 Low back pain: Secondary | ICD-10-CM | POA: Diagnosis not present

## 2019-08-05 DIAGNOSIS — M797 Fibromyalgia: Secondary | ICD-10-CM | POA: Diagnosis not present

## 2019-08-06 DIAGNOSIS — F332 Major depressive disorder, recurrent severe without psychotic features: Secondary | ICD-10-CM | POA: Diagnosis not present

## 2019-08-07 DIAGNOSIS — M25511 Pain in right shoulder: Secondary | ICD-10-CM | POA: Diagnosis not present

## 2019-08-07 DIAGNOSIS — M25512 Pain in left shoulder: Secondary | ICD-10-CM | POA: Diagnosis not present

## 2019-08-07 DIAGNOSIS — M797 Fibromyalgia: Secondary | ICD-10-CM | POA: Diagnosis not present

## 2019-08-07 DIAGNOSIS — M545 Low back pain: Secondary | ICD-10-CM | POA: Diagnosis not present

## 2019-08-12 DIAGNOSIS — M545 Low back pain: Secondary | ICD-10-CM | POA: Diagnosis not present

## 2019-08-12 DIAGNOSIS — M25512 Pain in left shoulder: Secondary | ICD-10-CM | POA: Diagnosis not present

## 2019-08-12 DIAGNOSIS — M797 Fibromyalgia: Secondary | ICD-10-CM | POA: Diagnosis not present

## 2019-08-12 DIAGNOSIS — M25511 Pain in right shoulder: Secondary | ICD-10-CM | POA: Diagnosis not present

## 2019-08-13 DIAGNOSIS — F332 Major depressive disorder, recurrent severe without psychotic features: Secondary | ICD-10-CM | POA: Diagnosis not present

## 2019-08-19 DIAGNOSIS — M25512 Pain in left shoulder: Secondary | ICD-10-CM | POA: Diagnosis not present

## 2019-08-19 DIAGNOSIS — M545 Low back pain: Secondary | ICD-10-CM | POA: Diagnosis not present

## 2019-08-19 DIAGNOSIS — M797 Fibromyalgia: Secondary | ICD-10-CM | POA: Diagnosis not present

## 2019-08-19 DIAGNOSIS — M25511 Pain in right shoulder: Secondary | ICD-10-CM | POA: Diagnosis not present

## 2019-08-20 ENCOUNTER — Institutional Professional Consult (permissible substitution): Payer: Self-pay | Admitting: Psychiatry

## 2019-08-21 DIAGNOSIS — F411 Generalized anxiety disorder: Secondary | ICD-10-CM | POA: Diagnosis not present

## 2019-08-21 DIAGNOSIS — F410 Panic disorder [episodic paroxysmal anxiety] without agoraphobia: Secondary | ICD-10-CM | POA: Diagnosis not present

## 2019-08-21 DIAGNOSIS — F331 Major depressive disorder, recurrent, moderate: Secondary | ICD-10-CM | POA: Diagnosis not present

## 2019-08-26 ENCOUNTER — Ambulatory Visit (INDEPENDENT_AMBULATORY_CARE_PROVIDER_SITE_OTHER): Payer: BC Managed Care – PPO | Admitting: Psychiatry

## 2019-08-26 ENCOUNTER — Other Ambulatory Visit: Payer: Self-pay

## 2019-08-26 ENCOUNTER — Encounter: Payer: Self-pay | Admitting: Psychiatry

## 2019-08-26 DIAGNOSIS — F332 Major depressive disorder, recurrent severe without psychotic features: Secondary | ICD-10-CM | POA: Diagnosis not present

## 2019-08-26 NOTE — Progress Notes (Signed)
This is a ECT consultation for this 48 year old woman referred by her outpatient psychiatrist.  Patient was reached by telephone.  Patient was on time and appropriate and attentive throughout the full conversation.  Patient is a client of Dr.Kaur in [place] and currently is receiving treatment for depression from her.  Patient reports longstanding symptoms of depression going back over 30 years.  She has been seeing her current psychiatrist for multiple years and has seen other doctors before that.  Current symptoms include chronic low mood and general sadness, chronic difficulty sleeping at night with frequent awakenings and difficulty falling asleep, lack of any definite enjoyment of anything in life, extremely passive suicidal thoughts but with no intent or thought of ever acting on it.  She does not report any psychotic symptoms.  She also has chronic fibromyalgia with multiple pain complaints.  Patient is currently taking Cymbalta 120 mg/day, Vyvanse daily and is also receiving ketamine nasal treatments.  She has not found these to be particularly effective although she has been getting them now for at least a couple months.  Patient was referred because of persistent symptoms despite multiple medications.  Patient said states that she has been on a wide range of medications.  He recalls Prozac Wellbutrin multiple mood stabilizers and antipsychotics as well as her current medicines.  Had many side effects and complaints and cannot tolerate antipsychotics.  States that she really is never found any medicine to be definitely helpful.  Currently Cymbalta possibly helps with some pain issues.  Patient has never had a hospitalization never had a suicide attempt no history of mania and no history of psychosis.  Patient is married lives with husband and has 2 children ages 56 and 16.  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  Good relationship with family.  Patient  states she drinks very infrequently and has never had a problem with it.  Does admit that she uses cannabis edibles when she can get them on a semiregular basis.  Finds that they actually seem to provide more benefit to her mood than anything else that she takes.  Patient was alert and oriented and appropriate in the conversation.  Provided coherent history.  Able to answer questions lucidly.  Affect sounded euthymic and appropriate and reactive.  Thoughts appear to be lucid without any sign of loosening of associations or delusional thinking.  Denies any current suicidal or homicidal thought.  Shows good insight and judgment about her illness and is able to ask appropriate questions and understand the treatment plan.  Short and long-term memory are described as impaired by her but every evidence of the conversation suggest that she has pretty appropriate short-term memory and long-term understanding of her illness.  Patient has had multiple surgeries in the past but does not have any chronic cardiac or pulmonary disease or other contraindications to ECT.  She has had multiple surgeries with general anesthesia without any complication.  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  She is being referred for ECT because of multiple trials of appropriate antidepressants as well as other biological treatments without any sustained improvement.  Patient continues to have significant impairment to her happiness and functioning.  Based on all this she is an appropriate candidate.  I described the specifics of our treatment to the patient.  Described in general the potential benefits and side effects including short term memory impairment.  Patient was given  opportunity to ask questions and ask appropriate questions showing good understanding of the proposed treatment plan.  At this time she is uncertain whether ECT would be appropriate for her based largely  on practical concerns such as work.  Her plan is to discuss this with her husband and then call back to my office within the next couple days to decide whether she wants to pursue treatment.  This seems like an entirely appropriate response to me.  I will look forward to hearing from her.  I have not put any orders in for any of the work-up labs yet at this point as we have not yet scheduled the time.

## 2019-08-27 DIAGNOSIS — N898 Other specified noninflammatory disorders of vagina: Secondary | ICD-10-CM | POA: Diagnosis not present

## 2019-08-27 DIAGNOSIS — L659 Nonscarring hair loss, unspecified: Secondary | ICD-10-CM | POA: Diagnosis not present

## 2019-08-28 DIAGNOSIS — F411 Generalized anxiety disorder: Secondary | ICD-10-CM | POA: Diagnosis not present

## 2019-08-28 DIAGNOSIS — F331 Major depressive disorder, recurrent, moderate: Secondary | ICD-10-CM | POA: Diagnosis not present

## 2019-08-28 DIAGNOSIS — F410 Panic disorder [episodic paroxysmal anxiety] without agoraphobia: Secondary | ICD-10-CM | POA: Diagnosis not present

## 2019-08-29 DIAGNOSIS — M797 Fibromyalgia: Secondary | ICD-10-CM | POA: Diagnosis not present

## 2019-08-29 DIAGNOSIS — M25511 Pain in right shoulder: Secondary | ICD-10-CM | POA: Diagnosis not present

## 2019-08-29 DIAGNOSIS — M545 Low back pain: Secondary | ICD-10-CM | POA: Diagnosis not present

## 2019-08-29 DIAGNOSIS — M25512 Pain in left shoulder: Secondary | ICD-10-CM | POA: Diagnosis not present

## 2019-09-10 DIAGNOSIS — M25511 Pain in right shoulder: Secondary | ICD-10-CM | POA: Diagnosis not present

## 2019-09-10 DIAGNOSIS — M25512 Pain in left shoulder: Secondary | ICD-10-CM | POA: Diagnosis not present

## 2019-09-10 DIAGNOSIS — M797 Fibromyalgia: Secondary | ICD-10-CM | POA: Diagnosis not present

## 2019-09-10 DIAGNOSIS — M545 Low back pain: Secondary | ICD-10-CM | POA: Diagnosis not present

## 2019-09-16 DIAGNOSIS — M25512 Pain in left shoulder: Secondary | ICD-10-CM | POA: Diagnosis not present

## 2019-09-16 DIAGNOSIS — M25511 Pain in right shoulder: Secondary | ICD-10-CM | POA: Diagnosis not present

## 2019-09-16 DIAGNOSIS — M797 Fibromyalgia: Secondary | ICD-10-CM | POA: Diagnosis not present

## 2019-09-16 DIAGNOSIS — M545 Low back pain: Secondary | ICD-10-CM | POA: Diagnosis not present

## 2019-09-25 ENCOUNTER — Ambulatory Visit
Admission: EM | Admit: 2019-09-25 | Discharge: 2019-09-25 | Disposition: A | Discharge status: Home / Self Care | Payer: BC Managed Care – PPO | Attending: Emergency Medicine | Admitting: Emergency Medicine

## 2019-09-25 ENCOUNTER — Encounter: Payer: Self-pay | Admitting: Emergency Medicine

## 2019-09-25 DIAGNOSIS — Z20828 Contact with and (suspected) exposure to other viral communicable diseases: Secondary | ICD-10-CM

## 2019-09-25 DIAGNOSIS — R05 Cough: Secondary | ICD-10-CM | POA: Diagnosis not present

## 2019-09-25 DIAGNOSIS — R059 Cough, unspecified: Secondary | ICD-10-CM

## 2019-09-25 DIAGNOSIS — R0981 Nasal congestion: Secondary | ICD-10-CM

## 2019-09-25 MED ORDER — BENZONATATE 100 MG PO CAPS: 100.0000 mg | ORAL_CAPSULE | Freq: Three times a day (TID) | ORAL | 0 refills | Status: DC

## 2019-09-25 NOTE — ED Provider Notes (Signed)
[facility]    CSN: [id number] Arrival date & time: 09/25/19  [id number]      History   Chief Complaint Chief Complaint  Patient presents with  . Cough    HPI [name] Still Kilmer is a 48 y.o. female with history of migraines, depression, anxiety presenting for nonproductive cough, nasal congestion, myalgias since Monday.  Denies known sick/Covid contact.  No fever, decreased appetite, chest pain, shortness of breath.  States that her employer is requiring Covid test prior to returning.  Has been taking daytime allergy medication, OTC cold medications with adequate relief of symptoms.    Past Medical History:  Diagnosis Date  . ADD (attention deficit disorder)   . Anxiety   . Arthropathy, unspecified, site unspecified   . Chronic fatigue syndrome   . Depression   . Disc degeneration   . Fibromyalgia   . History of kidney stones    last stone in 2010  . Migraines   . Multilevel degenerative disc disease    cervical and lumbar, has had injections/epidural  . Panic attack 2019  . PTSD (post-traumatic stress disorder) 2019  . Sleep disorder 01/08/2014  . Sleep disturbance, unspecified   . 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 Unspecified nonpsychotic mental disorder     Patient Active Problem List   Diagnosis Date Noted  . Neck pain 08/18/2018  . Back pain 08/18/2018  . Symptomatic mammary hypertrophy 08/18/2018  . Obesity, Class II, BMI 35-39.9 09/29/2017  . Depression 03/31/2017  . Thyroid disease 03/31/2017  . Sleep disorder 01/08/2014  . Fibroids 10/16/2013    Past Surgical History:  Procedure Laterality Date  . ABDOMINAL HYSTERECTOMY    . BILATERAL SALPINGECTOMY Bilateral 10/16/2013   Procedure: BILATERAL SALPINGECTOMY;  Surgeon: Lovenia Kim, MD;  Location: [facility];  Service: Gynecology;  Laterality: Bilateral;  . BREAST REDUCTION SURGERY Bilateral 10/03/2018   Procedure: BILATERAL BREAST REDUCTION;  Surgeon: Wallace Going, DO;   Location: [facility];  Service: Plastics;  Laterality: Bilateral;  . CESAREAN SECTION     x2  . CHOLECYSTECTOMY    . DIAGNOSTIC LAPAROSCOPY    . MANDIBLE SURGERY    . REMOVAL OF URINARY SLING N/A 01/09/2018   Procedure: REMOVAL OF MESH URINARY SLING EXTRUSION  CYSTOSTOPY;  Surgeon: Bjorn Loser, MD;  Location: [facility];  Service: Urology;  Laterality: N/A;  . ROBOTIC ASSISTED TOTAL HYSTERECTOMY N/A 10/16/2013   Procedure: ROBOTIC ASSISTED TOTAL HYSTERECTOMY;  Surgeon: Lovenia Kim, MD;  Location: [facility];  Service: Gynecology;  Laterality: N/A;    OB History   No obstetric history on file.      Home Medications    Prior to Admission medications   Medication Sig Start Date End Date Taking? Authorizing Provider  benzonatate (TESSALON) 100 MG capsule Take 1 capsule (100 mg total) by mouth every 8 (eight) hours. 09/25/19   Hall-Potvin, [place], PA-C  chlordiazePOXIDE (LIBRIUM) 5 MG capsule Take 1 to 2 capsule by mouth everyday at bedtime    [provider]  clonazePAM (KLONOPIN) 1 MG tablet Take 1 mg by mouth 3 (three) times daily as needed. 07/15/19   [provider]  diclofenac (VOLTAREN) 75 MG EC tablet Take 75 mg by mouth as needed.     [provider]  DULoxetine (CYMBALTA) 60 MG capsule Take 60 mg by mouth daily.     [provider]  Insulin Pen Needle (BD PEN NEEDLE MICRO U/F) 32G X 6 MM MISC USE DAILY  AS DIRECTED 12/12/17   [provider]  MURO 128 5 % ophthalmic ointment SMARTSIG:1 Inch(es) In Eye(s) Every Night 03/27/19   [provider]  SAXENDA 18 MG/3ML SOPN Inject 1.8 mg into the skin daily. 09/30/17   [provider]    Family History Family History  Problem Relation Age of Onset  . Heart attack Mother   . Hypertension Mother   . Depression Mother   . Neuropathy Father   . Diabetes Father   . Cancer Paternal Grandfather   . Stroke Maternal Grandmother   . Alcohol abuse Maternal  Grandfather   . Breast cancer Neg Hx     Social History Social History   Tobacco Use  . Smoking status: Never Smoker  . Smokeless tobacco: Never Used  Substance Use Topics  . Alcohol use: Yes    Comment: socially, once per month  . Drug use: Yes    Types: Marijuana    Comment: prn when pain gets bad     Allergies   Mirabegron, Sulfa antibiotics, Elavil [amitriptyline hcl], and Lyrica [pregabalin]   Review of Systems Review of Systems  Constitutional: Negative for activity change, fatigue and fever.  HENT: Positive for congestion. Negative for dental problem, ear pain, facial swelling, hearing loss, sinus pain, sore throat, trouble swallowing and voice change.   Eyes: Negative for photophobia, pain and visual disturbance.  Respiratory: Positive for cough. Negative for choking, chest tightness, shortness of breath and wheezing.   Cardiovascular: Negative for chest pain and palpitations.  Gastrointestinal: Negative for diarrhea and vomiting.  Musculoskeletal: Positive for myalgias. Negative for arthralgias, neck pain and neck stiffness.  Neurological: Negative for dizziness and headaches.     Physical Exam Triage Vital Signs ED Triage Vitals  Enc Vitals Group     BP 09/25/19 0843 112/66     Pulse Rate 09/25/19 0843 (!) 103     Resp 09/25/19 0843 18     Temp 09/25/19 0843 97.9 F (36.6 C)     Temp Source 09/25/19 0843 Oral     SpO2 09/25/19 0843 96 %     Weight --      Height --      Head Circumference --      Peak Flow --      Pain Score 09/25/19 0844 0     Pain Loc --      Pain Edu? --      Excl. in [place]? --    No data found.  Updated Vital Signs BP 112/66 (BP Location: Left Arm)   Pulse (!) 103   Temp 97.9 F (36.6 C) (Oral)   Resp 18   LMP 10/05/2013   SpO2 96%   Visual Acuity Right Eye Distance:   Left Eye Distance:   Bilateral Distance:    Right Eye Near:   Left Eye Near:    Bilateral Near:     Physical Exam Constitutional:      General:  She is not in acute distress.    Appearance: She is normal weight. She is not ill-appearing or toxic-appearing.  HENT:     Head: Normocephalic and atraumatic.     Mouth/Throat:     Mouth: Mucous membranes are moist.     Pharynx: Oropharynx is clear.  Eyes:     General: No scleral icterus.    Conjunctiva/sclera: Conjunctivae normal.     Pupils: Pupils are equal, round, and reactive to light.  Cardiovascular:     Rate and Rhythm: Normal  rate and regular rhythm.     Comments: HR 91-94 at bedside Pulmonary:     Effort: Pulmonary effort is normal. No respiratory distress.     Breath sounds: No wheezing.  Musculoskeletal: Normal range of motion.     Right lower leg: No edema.     Left lower leg: No edema.  Skin:    Capillary Refill: Capillary refill takes less than 2 seconds.     Coloration: Skin is not jaundiced or pale.  Neurological:     General: No focal deficit present.     Mental Status: She is alert and oriented to person, place, and time.      UC Treatments / Results  Labs (all labs ordered are listed, but only abnormal results are displayed) Labs Reviewed  NOVEL CORONAVIRUS, NAA    EKG   Radiology No results found.  Procedures Procedures (including critical care time)  Medications Ordered in UC Medications - No data to display  Initial Impression / Assessment and Plan / UC Course  I have reviewed the triage vital signs and the nursing notes.  Pertinent labs & imaging results that were available during my care of the patient were reviewed by me and considered in my medical decision making (see chart for details).     Afebrile, nontoxic.  Covid PCR pending.  Will add benzonatate to current supportive care regimen as she has done well with this medication in the past.  Return precautions discussed, patient verbalized understanding and is agreeable to plan. Final Clinical Impressions(s) / UC Diagnoses   Final diagnoses:  Cough     Discharge Instructions      Your COVID test is pending - it is important to quarantine / isolate at home until your results are back. If you test positive and would like further evaluation for persistent or worsening symptoms, you may schedule an E-visit or virtual (video) visit throughout the [facility] app or website.  PLEASE NOTE: If you develop severe chest pain or shortness of breath please go to the ER or call 9-1-1 for further evaluation --> DO NOT schedule electronic or virtual visits for this. Please call our office for further guidance / recommendations as needed.    ED Prescriptions    Medication Sig Dispense Auth. Provider   benzonatate (TESSALON) 100 MG capsule Take 1 capsule (100 mg total) by mouth every 8 (eight) hours. 21 capsule Hall-Potvin, [place], PA-C     PDMP not reviewed this encounter.   Neldon Mc [place], [place] 09/25/19 [phone]

## 2019-09-25 NOTE — ED Triage Notes (Signed)
Pt c/o deep cough, nasal congestion, and bodyaches since Monday. Needs a COVID testing for work

## 2019-09-25 NOTE — Discharge Instructions (Signed)
Your COVID test is pending - it is important to quarantine / isolate at home until your results are back. °If you test positive and would like further evaluation for persistent or worsening symptoms, you may schedule an E-visit or virtual (video) visit throughout the [facility] My[facility] app or website. ° °PLEASE NOTE: If you develop severe chest pain or shortness of breath please go to the ER or call 9-1-1 for further evaluation --> DO NOT schedule electronic or virtual visits for this. °Please call our office for further guidance / recommendations as needed. °

## 2019-09-27 ENCOUNTER — Telehealth: Payer: Self-pay | Admitting: Emergency Medicine

## 2019-09-27 LAB — NOVEL CORONAVIRUS, NAA: SARS-CoV-2, NAA: DETECTED — AB

## 2019-09-27 NOTE — Telephone Encounter (Signed)
Pt called for COVID results.  Confirmed identity using two identifiers, and provided positive result.  Patient did not have any questions at this time, verbalized understanding.

## 2019-09-30 DIAGNOSIS — F332 Major depressive disorder, recurrent severe without psychotic features: Secondary | ICD-10-CM | POA: Diagnosis not present

## 2019-09-30 DIAGNOSIS — F341 Dysthymic disorder: Secondary | ICD-10-CM | POA: Diagnosis not present

## 2019-10-01 DIAGNOSIS — F410 Panic disorder [episodic paroxysmal anxiety] without agoraphobia: Secondary | ICD-10-CM | POA: Diagnosis not present

## 2019-10-01 DIAGNOSIS — F331 Major depressive disorder, recurrent, moderate: Secondary | ICD-10-CM | POA: Diagnosis not present

## 2019-10-01 DIAGNOSIS — F411 Generalized anxiety disorder: Secondary | ICD-10-CM | POA: Diagnosis not present

## 2019-10-02 ENCOUNTER — Ambulatory Visit
Admission: EM | Admit: 2019-10-02 | Discharge: 2019-10-02 | Discharge status: Absent without leave | Payer: BC Managed Care – PPO

## 2019-10-04 ENCOUNTER — Other Ambulatory Visit: Payer: Self-pay

## 2019-10-04 DIAGNOSIS — Z20822 Contact with and (suspected) exposure to covid-19: Secondary | ICD-10-CM

## 2019-10-04 DIAGNOSIS — Z20828 Contact with and (suspected) exposure to other viral communicable diseases: Secondary | ICD-10-CM | POA: Diagnosis not present

## 2019-10-05 ENCOUNTER — Encounter: Payer: Self-pay | Admitting: Emergency Medicine

## 2019-10-05 ENCOUNTER — Ambulatory Visit
Admission: EM | Admit: 2019-10-05 | Discharge: 2019-10-05 | Disposition: A | Discharge status: Home / Self Care | Payer: BC Managed Care – PPO | Attending: Physician Assistant | Admitting: Physician Assistant

## 2019-10-05 ENCOUNTER — Other Ambulatory Visit: Payer: Self-pay

## 2019-10-05 DIAGNOSIS — R05 Cough: Secondary | ICD-10-CM | POA: Diagnosis not present

## 2019-10-05 DIAGNOSIS — R059 Cough, unspecified: Secondary | ICD-10-CM

## 2019-10-05 DIAGNOSIS — U071 COVID-19: Secondary | ICD-10-CM

## 2019-10-05 LAB — NOVEL CORONAVIRUS, NAA: SARS-CoV-2, NAA: DETECTED — AB

## 2019-10-05 MED ORDER — PREDNISONE 50 MG PO TABS: 50.0000 mg | ORAL_TABLET | Freq: Every day | ORAL | 0 refills | Status: DC

## 2019-10-05 NOTE — Discharge Instructions (Signed)
Start prednisone as directed. Keep hydrated, urine should be clear to pale yellow in color. Follow up with PCP for further evaluation if symptoms not improving.

## 2019-10-05 NOTE — ED Provider Notes (Signed)
[facility]    CSN: [id number] Arrival date & time: 10/05/19  1024      History   Chief Complaint Chief Complaint  Patient presents with  . Follow-up    HPI [name] Still Ely is a 48 y.o. female.   48 year old female returns to clinic after positive Covid test 09/25/2019.  States symptoms are improving, and would like to return to work.  However, she was sent home from work yesterday due to cough, and therefore came in for evaluation.  Symptoms started 09/23/2019, with cough, nasal congestion, myalgia.  She denies developing other symptoms since last visit.  Currently only symptom is cough, which has improved since symptom onset.  Denies shortness of breath.  Denies fever, chills.     Past Medical History:  Diagnosis Date  . ADD (attention deficit disorder)   . Anxiety   . Arthropathy, unspecified, site unspecified   . Chronic fatigue syndrome   . Depression   . Disc degeneration   . Fibromyalgia   . History of kidney stones    last stone in 2010  . Migraines   . Multilevel degenerative disc disease    cervical and lumbar, has had injections/epidural  . Panic attack 2019  . PTSD (post-traumatic stress disorder) 2019  . Sleep disorder 01/08/2014  . Sleep disturbance, unspecified   . 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 Unspecified nonpsychotic mental disorder     Patient Active Problem List   Diagnosis Date Noted  . Neck pain 08/18/2018  . Back pain 08/18/2018  . Symptomatic mammary hypertrophy 08/18/2018  . Obesity, Class II, BMI 35-39.9 09/29/2017  . Depression 03/31/2017  . Thyroid disease 03/31/2017  . Sleep disorder 01/08/2014  . Fibroids 10/16/2013    Past Surgical History:  Procedure Laterality Date  . ABDOMINAL HYSTERECTOMY    . BILATERAL SALPINGECTOMY Bilateral 10/16/2013   Procedure: BILATERAL SALPINGECTOMY;  Surgeon: Lovenia Kim, MD;  Location: [facility];  Service: Gynecology;  Laterality: Bilateral;  . BREAST  REDUCTION SURGERY Bilateral 10/03/2018   Procedure: BILATERAL BREAST REDUCTION;  Surgeon: Wallace Going, DO;  Location: [facility];  Service: Plastics;  Laterality: Bilateral;  . CESAREAN SECTION     x2  . CHOLECYSTECTOMY    . DIAGNOSTIC LAPAROSCOPY    . MANDIBLE SURGERY    . REMOVAL OF URINARY SLING N/A 01/09/2018   Procedure: REMOVAL OF MESH URINARY SLING EXTRUSION  CYSTOSTOPY;  Surgeon: Bjorn Loser, MD;  Location: [facility];  Service: Urology;  Laterality: N/A;  . ROBOTIC ASSISTED TOTAL HYSTERECTOMY N/A 10/16/2013   Procedure: ROBOTIC ASSISTED TOTAL HYSTERECTOMY;  Surgeon: Lovenia Kim, MD;  Location: [facility];  Service: Gynecology;  Laterality: N/A;    OB History   No obstetric history on file.      Home Medications    Prior to Admission medications   Medication Sig Start Date End Date Taking? Authorizing Provider  benzonatate (TESSALON) 100 MG capsule Take 1 capsule (100 mg total) by mouth every 8 (eight) hours. 09/25/19   Hall-Potvin, [place], PA-C  chlordiazePOXIDE (LIBRIUM) 5 MG capsule Take 1 to 2 capsule by mouth everyday at bedtime    [provider]  clonazePAM (KLONOPIN) 1 MG tablet Take 1 mg by mouth 3 (three) times daily as needed. 07/15/19   [provider]  diclofenac (VOLTAREN) 75 MG EC tablet Take 75 mg by mouth as needed.     [provider]  DULoxetine (CYMBALTA) 60 MG capsule Take 60 mg by  mouth daily.     [provider]  Insulin Pen Needle (BD PEN NEEDLE MICRO U/F) 32G X 6 MM MISC USE DAILY AS DIRECTED 12/12/17   [provider]  MURO 128 5 % ophthalmic ointment SMARTSIG:1 Inch(es) In Eye(s) Every Night 03/27/19   [provider]  predniSONE (DELTASONE) 50 MG tablet Take 1 tablet (50 mg total) by mouth daily with breakfast. 10/05/19   Tasia Catchings, Vyctoria Dickman V, PA-C  SAXENDA 18 MG/3ML SOPN Inject 1.8 mg into the skin daily. 09/30/17   [provider]    Family History Family History    Problem Relation Age of Onset  . Heart attack Mother   . Hypertension Mother   . Depression Mother   . Neuropathy Father   . Diabetes Father   . Cancer Paternal Grandfather   . Stroke Maternal Grandmother   . Alcohol abuse Maternal Grandfather   . Breast cancer Neg Hx     Social History Social History   Tobacco Use  . Smoking status: Never Smoker  . Smokeless tobacco: Never Used  Substance Use Topics  . Alcohol use: Yes    Comment: socially, once per month  . Drug use: Yes    Types: Marijuana    Comment: prn when pain gets bad     Allergies   Mirabegron, Sulfa antibiotics, Elavil [amitriptyline hcl], and Lyrica [pregabalin]   Review of Systems Review of Systems  Reason unable to perform ROS: See HPI as above.     Physical Exam Triage Vital Signs ED Triage Vitals  Enc Vitals Group     BP 10/05/19 1136 107/86     Pulse Rate 10/05/19 1136 89     Resp 10/05/19 1136 18     Temp 10/05/19 1136 98 F (36.7 C)     Temp src --      SpO2 10/05/19 1136 97 %     Weight --      Height --      Head Circumference --      Peak Flow --      Pain Score 10/05/19 1139 0     Pain Loc --      Pain Edu? --      Excl. in [place]? --    No data found.  Updated Vital Signs BP 107/86   Pulse 89   Temp 98 F (36.7 C)   Resp 18   LMP 10/05/2013   SpO2 97%   Physical Exam Constitutional:      General: She is not in acute distress.    Appearance: Normal appearance. She is not ill-appearing, toxic-appearing or diaphoretic.  HENT:     Head: Normocephalic and atraumatic.     Mouth/Throat:     Mouth: Mucous membranes are moist.     Pharynx: Oropharynx is clear. Uvula midline.  Cardiovascular:     Rate and Rhythm: Normal rate and regular rhythm.     Heart sounds: Normal heart sounds. No murmur. No friction rub. No gallop.   Pulmonary:     Effort: Pulmonary effort is normal. No accessory muscle usage, prolonged expiration, respiratory distress or retractions.     Comments:  Lungs clear to auscultation without adventitious lung sounds. Musculoskeletal:     Cervical back: Normal range of motion and neck supple.  Skin:    General: Skin is warm and dry.  Neurological:     General: No focal deficit present.     Mental Status: She is alert and oriented to person,  place, and time.    UC Treatments / Results  Labs (all labs ordered are listed, but only abnormal results are displayed) Labs Reviewed - No data to display  EKG   Radiology No results found.  Procedures Procedures (including critical care time)  Medications Ordered in UC Medications - No data to display  Initial Impression / Assessment and Plan / UC Course  I have reviewed the triage vital signs and the nursing notes.  Pertinent labs & imaging results that were available during my care of the patient were reviewed by me and considered in my medical decision making (see chart for details).    Patient with > 10 days since symptom onset, at least 24 hours of improvement of symptoms.  Cleared to return to work.  Patient with history of bronchitis after viral illness, will provide prednisone for cough.  Return precautions given.  Patient expresses understanding and agrees to plan.  Final Clinical Impressions(s) / UC Diagnoses   Final diagnoses:  COVID-19  Cough   ED Prescriptions    Medication Sig Dispense Auth. Provider   predniSONE (DELTASONE) 50 MG tablet Take 1 tablet (50 mg total) by mouth daily with breakfast. 5 tablet Ok Edwards, PA-C     PDMP not reviewed this encounter.   Ok Edwards, PA-C 10/05/19 1340

## 2019-10-05 NOTE — ED Triage Notes (Signed)
Pt states she tested positive for covid on 09/25/2019, states she feels much better, has a lingering cough but symptoms are resolved. No fever. Needs note to return to work. Pt states they sent her home due to her cough.

## 2019-10-06 ENCOUNTER — Telehealth (HOSPITAL_COMMUNITY): Payer: Self-pay | Admitting: Critical Care Medicine

## 2019-10-06 NOTE — Telephone Encounter (Signed)
Patient tested + December 18 for Covid and knows she needs to stay in isolation till 29 December.  This was a retest from previous positivity 11 days ago.  I told the patient that this is likely a false positive and she was retested too soon.  Apparently she needs a negative test to go back to work though we no longer recommend retesting

## 2019-10-06 NOTE — Telephone Encounter (Signed)
-----   Message from Carlisle Beers, RN sent at 10/06/2019  7:01 AM EST -----  ----- Message ----- From: Lavone Neri Lab Results In Sent: 10/05/2019  10:35 PM EST To: Mobile Screening Testing Result Pool

## 2019-10-07 ENCOUNTER — Ambulatory Visit
Admission: EM | Admit: 2019-10-07 | Discharge: 2019-10-07 | Disposition: A | Discharge status: Home / Self Care | Payer: BC Managed Care – PPO | Attending: Physician Assistant | Admitting: Physician Assistant

## 2019-10-07 DIAGNOSIS — U071 COVID-19: Secondary | ICD-10-CM | POA: Diagnosis not present

## 2019-10-07 DIAGNOSIS — J209 Acute bronchitis, unspecified: Secondary | ICD-10-CM

## 2019-10-07 MED ORDER — ALBUTEROL SULFATE (2.5 MG/3ML) 0.083% IN NEBU: 2.5000 mg | INHALATION_SOLUTION | Freq: Four times a day (QID) | RESPIRATORY_TRACT | 0 refills | Status: DC | PRN

## 2019-10-07 MED ORDER — AZITHROMYCIN 250 MG PO TABS: 250.0000 mg | ORAL_TABLET | Freq: Every day | ORAL | 0 refills | Status: DC

## 2019-10-07 NOTE — Discharge Instructions (Signed)
Resume quarantine until your symptoms resolve. Albuterol as needed. Azithromycin as directed. Continue other symptomatic treatment. Follow up with PCP as scheduled for further evaluation needed. If worsening symptoms, go to the emergency department for further evaluation needed.

## 2019-10-07 NOTE — ED Triage Notes (Signed)
Pt c/o cough for past 2 wks after testing positive. For the past 2 days having diarrhea, upset stomach, weak, vision changes, unstable walking and pain on breathing.

## 2019-10-07 NOTE — ED Provider Notes (Signed)
[facility]    CSN: [id number] Arrival date & time: 10/07/19  [id number]     History   Chief Complaint Chief Complaint  Patient presents with  . Cough    HPI [name] is a 48 y.o. female.   48 year old female returns to clinic after visit 10/05/2019 where she was cleared to return to work after positive COVID 19. She had worsening symptoms after being seen, with cough, fatigue, headache, chills, sore throat. Denies fever.  Denies shortness of breath.  Patient complains of unstable walking, but was able to ambulate on own into clinic.      Past Medical History:  Diagnosis Date  . ADD (attention deficit disorder)   . Anxiety   . Arthropathy, unspecified, site unspecified   . Chronic fatigue syndrome   . Depression   . Disc degeneration   . Fibromyalgia   . History of kidney stones    last stone in 2010  . Migraines   . Multilevel degenerative disc disease    cervical and lumbar, has had injections/epidural  . Panic attack 2019  . PTSD (post-traumatic stress disorder) 2019  . Sleep disorder 01/08/2014  . Sleep disturbance, unspecified   . 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 Unspecified nonpsychotic mental disorder     Patient Active Problem List   Diagnosis Date Noted  . Neck pain 08/18/2018  . Back pain 08/18/2018  . Symptomatic mammary hypertrophy 08/18/2018  . Obesity, Class II, BMI 35-39.9 09/29/2017  . Depression 03/31/2017  . Thyroid disease 03/31/2017  . Sleep disorder 01/08/2014  . Fibroids 10/16/2013    Past Surgical History:  Procedure Laterality Date  . ABDOMINAL HYSTERECTOMY    . BILATERAL SALPINGECTOMY Bilateral 10/16/2013   Procedure: BILATERAL SALPINGECTOMY;  Surgeon: Lovenia Kim, MD;  Location: [facility];  Service: Gynecology;  Laterality: Bilateral;  . BREAST REDUCTION SURGERY Bilateral 10/03/2018   Procedure: BILATERAL BREAST REDUCTION;  Surgeon: Wallace Going, DO;  Location: [place];  Service: Plastics;  Laterality: Bilateral;  . CESAREAN SECTION     x2  . CHOLECYSTECTOMY    . DIAGNOSTIC LAPAROSCOPY    . MANDIBLE SURGERY    . REMOVAL OF URINARY SLING N/A 01/09/2018   Procedure: REMOVAL OF MESH URINARY SLING EXTRUSION  CYSTOSTOPY;  Surgeon: Bjorn Loser, MD;  Location: [facility];  Service: Urology;  Laterality: N/A;  . ROBOTIC ASSISTED TOTAL HYSTERECTOMY N/A 10/16/2013   Procedure: ROBOTIC ASSISTED TOTAL HYSTERECTOMY;  Surgeon: Lovenia Kim, MD;  Location: [facility];  Service: Gynecology;  Laterality: N/A;    OB History   No obstetric history on file.      Home Medications    Prior to Admission medications   Medication Sig Start Date End Date Taking? Authorizing Provider  albuterol (PROVENTIL) (2.5 MG/3ML) 0.083% nebulizer solution Take 3 mLs (2.5 mg total) by nebulization every 6 (six) hours as needed for wheezing or shortness of breath. 10/07/19   Tasia Catchings, Nanako Stopher V, PA-C  azithromycin (ZITHROMAX) 250 MG tablet Take 1 tablet (250 mg total) by mouth daily. Take first 2 tablets together, then 1 every day until finished. 10/07/19   Tasia Catchings, Kaylee Wombles V, PA-C  benzonatate (TESSALON) 100 MG capsule Take 1 capsule (100 mg total) by mouth every 8 (eight) hours. 09/25/19   Hall-Potvin, [place], PA-C  chlordiazePOXIDE (LIBRIUM) 5 MG capsule Take 1 to 2 capsule by mouth everyday at bedtime    [provider]  clonazePAM (KLONOPIN) 1 MG tablet Take  1 mg by mouth 3 (three) times daily as needed. 07/15/19   [provider]  diclofenac (VOLTAREN) 75 MG EC tablet Take 75 mg by mouth as needed.     [provider]  DULoxetine (CYMBALTA) 60 MG capsule Take 60 mg by mouth daily.     [provider]  Insulin Pen Needle (BD PEN NEEDLE MICRO U/F) 32G X 6 MM MISC USE DAILY AS DIRECTED 12/12/17   [provider]  MURO 128 5 % ophthalmic ointment SMARTSIG:1 Inch(es) In Eye(s) Every Night 03/27/19   [provider]  predniSONE (DELTASONE) 50 MG  tablet Take 1 tablet (50 mg total) by mouth daily with breakfast. 10/05/19   Tasia Catchings, Senay Sistrunk V, PA-C  SAXENDA 18 MG/3ML SOPN Inject 1.8 mg into the skin daily. 09/30/17   [provider]    Family History Family History  Problem Relation Age of Onset  . Heart attack Mother   . Hypertension Mother   . Depression Mother   . Neuropathy Father   . Diabetes Father   . Cancer Paternal Grandfather   . Stroke Maternal Grandmother   . Alcohol abuse Maternal Grandfather   . Breast cancer Neg Hx     Social History Social History   Tobacco Use  . Smoking status: Never Smoker  . Smokeless tobacco: Never Used  Substance Use Topics  . Alcohol use: Yes    Comment: socially, once per month  . Drug use: Yes    Types: Marijuana    Comment: prn when pain gets bad     Allergies   Mirabegron, Sulfa antibiotics, Elavil [amitriptyline hcl], and Lyrica [pregabalin]   Review of Systems Review of Systems  Reason unable to perform ROS: See HPI as above.     Physical Exam Triage Vital Signs ED Triage Vitals  Enc Vitals Group     BP 10/07/19 0955 127/86     Pulse Rate 10/07/19 0955 81     Resp 10/07/19 0955 18     Temp 10/07/19 0955 98.2 F (36.8 C)     Temp Source 10/07/19 0955 Oral     SpO2 10/07/19 0955 97 %     Weight --      Height --      Head Circumference --      Peak Flow --      Pain Score 10/07/19 0956 5     Pain Loc --      Pain Edu? --      Excl. in [place]? --    No data found.  Updated Vital Signs BP 127/86 (BP Location: Left Arm)   Pulse 81   Temp 98.2 F (36.8 C) (Oral)   Resp 18   LMP 10/05/2013   SpO2 97%   Physical Exam Constitutional:      General: She is not in acute distress.    Appearance: Normal appearance. She is not ill-appearing, toxic-appearing or diaphoretic.  HENT:     Head: Normocephalic and atraumatic.     Mouth/Throat:     Mouth: Mucous membranes are moist.     Pharynx: Oropharynx is clear. Uvula midline.  Cardiovascular:     Rate  and Rhythm: Normal rate and regular rhythm.     Heart sounds: Normal heart sounds. No murmur. No friction rub. No gallop.   Pulmonary:     Effort: Pulmonary effort is normal. No accessory muscle usage, prolonged expiration, respiratory distress or retractions.     Comments: Lungs clear to auscultation without  adventitious lung sounds.  Constant cough throughout exam. Musculoskeletal:     Cervical back: Normal range of motion and neck supple.  Neurological:     General: No focal deficit present.     Mental Status: She is alert and oriented to person, place, and time.     UC Treatments / Results  Labs (all labs ordered are listed, but only abnormal results are displayed) Labs Reviewed - No data to display  EKG   Radiology No results found.  Procedures Procedures (including critical care time)  Medications Ordered in UC Medications - No data to display  Initial Impression / Assessment and Plan / UC Course  I have reviewed the triage vital signs and the nursing notes.  Pertinent labs & imaging results that were available during my care of the patient were reviewed by me and considered in my medical decision making (see chart for details).    Patient will resume quarantine at this time.  Albuterol as needed for symptomatic management.  Discussed bronchitis causing symptoms. ?  Whooping cough given current coughing episodes. Will start azithromycin at this time. Continue prednisone. Return precautions given.  Final Clinical Impressions(s) / UC Diagnoses   Final diagnoses:  Acute bronchitis, unspecified organism  COVID-19   ED Prescriptions    Medication Sig Dispense Auth. Provider   azithromycin (ZITHROMAX) 250 MG tablet Take 1 tablet (250 mg total) by mouth daily. Take first 2 tablets together, then 1 every day until finished. 6 tablet Santi Troung V, PA-C   albuterol (PROVENTIL) (2.5 MG/3ML) 0.083% nebulizer solution Take 3 mLs (2.5 mg total) by nebulization every 6 (six) hours  as needed for wheezing or shortness of breath. 75 mL Tasia Catchings, Lilyanna Lunt V, PA-C     I have reviewed the PDMP during this encounter.   Ok Edwards, PA-C 10/07/19 1839

## 2019-10-21 DIAGNOSIS — F410 Panic disorder [episodic paroxysmal anxiety] without agoraphobia: Secondary | ICD-10-CM | POA: Diagnosis not present

## 2019-10-21 DIAGNOSIS — F411 Generalized anxiety disorder: Secondary | ICD-10-CM | POA: Diagnosis not present

## 2019-10-21 DIAGNOSIS — F331 Major depressive disorder, recurrent, moderate: Secondary | ICD-10-CM | POA: Diagnosis not present

## 2019-11-07 DIAGNOSIS — F332 Major depressive disorder, recurrent severe without psychotic features: Secondary | ICD-10-CM | POA: Diagnosis not present

## 2019-11-07 DIAGNOSIS — F341 Dysthymic disorder: Secondary | ICD-10-CM | POA: Diagnosis not present

## 2019-11-12 DIAGNOSIS — E559 Vitamin D deficiency, unspecified: Secondary | ICD-10-CM | POA: Diagnosis not present

## 2019-11-12 DIAGNOSIS — E7849 Other hyperlipidemia: Secondary | ICD-10-CM | POA: Diagnosis not present

## 2019-11-12 DIAGNOSIS — Z0000 Encounter for general adult medical examination without abnormal findings: Secondary | ICD-10-CM | POA: Diagnosis not present

## 2019-11-13 DIAGNOSIS — R82998 Other abnormal findings in urine: Secondary | ICD-10-CM | POA: Diagnosis not present

## 2019-11-15 DIAGNOSIS — Z1212 Encounter for screening for malignant neoplasm of rectum: Secondary | ICD-10-CM | POA: Diagnosis not present

## 2019-12-03 DIAGNOSIS — F320 Major depressive disorder, single episode, mild: Secondary | ICD-10-CM | POA: Diagnosis not present

## 2019-12-03 DIAGNOSIS — F902 Attention-deficit hyperactivity disorder, combined type: Secondary | ICD-10-CM | POA: Diagnosis not present

## 2019-12-17 DIAGNOSIS — F320 Major depressive disorder, single episode, mild: Secondary | ICD-10-CM | POA: Diagnosis not present

## 2019-12-17 DIAGNOSIS — F902 Attention-deficit hyperactivity disorder, combined type: Secondary | ICD-10-CM | POA: Diagnosis not present

## 2019-12-24 DIAGNOSIS — F902 Attention-deficit hyperactivity disorder, combined type: Secondary | ICD-10-CM | POA: Diagnosis not present

## 2019-12-24 DIAGNOSIS — F320 Major depressive disorder, single episode, mild: Secondary | ICD-10-CM | POA: Diagnosis not present

## 2019-12-30 ENCOUNTER — Other Ambulatory Visit: Payer: Self-pay

## 2019-12-30 ENCOUNTER — Encounter: Payer: Self-pay | Admitting: Emergency Medicine

## 2019-12-30 ENCOUNTER — Ambulatory Visit
Admission: EM | Admit: 2019-12-30 | Discharge: 2019-12-30 | Disposition: A | Discharge status: Home / Self Care | Payer: BC Managed Care – PPO | Attending: Physician Assistant | Admitting: Physician Assistant

## 2019-12-30 DIAGNOSIS — R05 Cough: Secondary | ICD-10-CM | POA: Diagnosis not present

## 2019-12-30 DIAGNOSIS — Z20822 Contact with and (suspected) exposure to covid-19: Secondary | ICD-10-CM

## 2019-12-30 DIAGNOSIS — R197 Diarrhea, unspecified: Secondary | ICD-10-CM | POA: Diagnosis not present

## 2019-12-30 DIAGNOSIS — R1084 Generalized abdominal pain: Secondary | ICD-10-CM

## 2019-12-30 DIAGNOSIS — R519 Headache, unspecified: Secondary | ICD-10-CM

## 2019-12-30 DIAGNOSIS — Z8616 Personal history of COVID-19: Secondary | ICD-10-CM

## 2019-12-30 DIAGNOSIS — R059 Cough, unspecified: Secondary | ICD-10-CM

## 2019-12-30 LAB — POCT URINALYSIS DIP (MANUAL ENTRY)
Bilirubin, UA: NEGATIVE
Blood, UA: NEGATIVE
Glucose, UA: NEGATIVE mg/dL
Ketones, POC UA: NEGATIVE mg/dL
Leukocytes, UA: NEGATIVE
Nitrite, UA: NEGATIVE
Protein Ur, POC: NEGATIVE mg/dL
Spec Grav, UA: >=1.03 — AB (ref 1.010–1.025)
Urobilinogen, UA: 0.2 E.U./dL
pH, UA: 5.5 (ref 5.0–8.0)

## 2019-12-30 MED ORDER — FLUTICASONE PROPIONATE 50 MCG/ACT NA SUSP: 2.0000 | Freq: Every day | NASAL | 0 refills | Status: DC

## 2019-12-30 MED ORDER — DICYCLOMINE HCL 20 MG PO TABS: 20.0000 mg | ORAL_TABLET | Freq: Two times a day (BID) | ORAL | 0 refills | Status: DC

## 2019-12-30 MED ORDER — AZELASTINE HCL 0.1 % NA SOLN: 2.0000 | Freq: Two times a day (BID) | NASAL | 0 refills | Status: DC

## 2019-12-30 MED ORDER — KETOROLAC TROMETHAMINE 30 MG/ML IJ SOLN
15.0000 mg | Freq: Once | INTRAMUSCULAR | Status: AC
  Administered 2019-12-30: 15 mg via INTRAMUSCULAR

## 2019-12-30 NOTE — ED Provider Notes (Signed)
[facility]    CSN: [id number] Arrival date & time: 12/30/19  1853      History   Chief Complaint Chief Complaint  Patient presents with  . Cough    HPI [name] is a 49 y.o. female.   49 year old female returns for COVID testing. She was tested positive for COVID 09/2019. Since then has had intermittent headache, loss of taste/smell, nausea. Headache is sharp in nature, traveling from frontal to posterior head. Nausea worse with headache, with one episode of NBNB vomit today. Has been able to tolerate oral intake since. Had noted some flank pain with "tingle" with urination last night and worries for kidney stone. Denies frequency, urgency. Denies fever. Has had epigastric pain that is intermittent with diarrhea. Cough without shortness of breath. Patient would like to be retested from [place].      Past Medical History:  Diagnosis Date  . ADD (attention deficit disorder)   . Anxiety   . Arthropathy, unspecified, site unspecified   . Chronic fatigue syndrome   . Depression   . Disc degeneration   . Fibromyalgia   . History of kidney stones    last stone in 2010  . Migraines   . Multilevel degenerative disc disease    cervical and lumbar, has had injections/epidural  . Panic attack 2019  . PTSD (post-traumatic stress disorder) 2019  . Sleep disorder 01/08/2014  . Sleep disturbance, unspecified   . 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 Unspecified nonpsychotic mental disorder     Patient Active Problem List   Diagnosis Date Noted  . Neck pain 08/18/2018  . Back pain 08/18/2018  . Symptomatic mammary hypertrophy 08/18/2018  . Obesity, Class II, BMI 35-39.9 09/29/2017  . Depression 03/31/2017  . Thyroid disease 03/31/2017  . Sleep disorder 01/08/2014  . Fibroids 10/16/2013    Past Surgical History:  Procedure Laterality Date  . ABDOMINAL HYSTERECTOMY    . BILATERAL SALPINGECTOMY Bilateral 10/16/2013   Procedure: BILATERAL  SALPINGECTOMY;  Surgeon: Lovenia Kim, MD;  Location: [facility];  Service: Gynecology;  Laterality: Bilateral;  . BREAST REDUCTION SURGERY Bilateral 10/03/2018   Procedure: BILATERAL BREAST REDUCTION;  Surgeon: Wallace Going, DO;  Location: [place];  Service: Plastics;  Laterality: Bilateral;  . CESAREAN SECTION     x2  . CHOLECYSTECTOMY    . DIAGNOSTIC LAPAROSCOPY    . MANDIBLE SURGERY    . REMOVAL OF URINARY SLING N/A 01/09/2018   Procedure: REMOVAL OF MESH URINARY SLING EXTRUSION  CYSTOSTOPY;  Surgeon: Bjorn Loser, MD;  Location: [facility];  Service: Urology;  Laterality: N/A;  . ROBOTIC ASSISTED TOTAL HYSTERECTOMY N/A 10/16/2013   Procedure: ROBOTIC ASSISTED TOTAL HYSTERECTOMY;  Surgeon: Lovenia Kim, MD;  Location: [facility];  Service: Gynecology;  Laterality: N/A;    OB History   No obstetric history on file.      Home Medications    Prior to Admission medications   Medication Sig Start Date End Date Taking? Authorizing Provider  albuterol (PROVENTIL) (2.5 MG/3ML) 0.083% nebulizer solution Take 3 mLs (2.5 mg total) by nebulization every 6 (six) hours as needed for wheezing or shortness of breath. 10/07/19   Tasia Catchings, Cheyan Frees V, PA-C  azelastine (ASTELIN) 0.1 % nasal spray Place 2 sprays into both nostrils 2 (two) times daily. 12/30/19   Tasia Catchings, Dajaun Goldring V, PA-C  chlordiazePOXIDE (LIBRIUM) 5 MG capsule Take 1 to 2 capsule by mouth everyday at bedtime    [provider]  clonazePAM (KLONOPIN) 1 MG tablet Take 1 mg by mouth 3 (three) times daily as needed. 07/15/19   [provider]  diclofenac (VOLTAREN) 75 MG EC tablet Take 75 mg by mouth as needed.     [provider]  dicyclomine (BENTYL) 20 MG tablet Take 1 tablet (20 mg total) by mouth 2 (two) times daily. 12/30/19   Tasia Catchings, Jenalyn Girdner V, PA-C  DULoxetine (CYMBALTA) 60 MG capsule Take 60 mg by mouth daily.     [provider]  fluticasone (FLONASE) 50 MCG/ACT nasal spray Place 2 sprays into both  nostrils daily. 12/30/19   Tasia Catchings, Bethsaida Siegenthaler V, PA-C  Insulin Pen Needle (BD PEN NEEDLE MICRO U/F) 32G X 6 MM MISC USE DAILY AS DIRECTED 12/12/17   [provider]  MURO 128 5 % ophthalmic ointment SMARTSIG:1 Inch(es) In Eye(s) Every Night 03/27/19   [provider]  SAXENDA 18 MG/3ML SOPN Inject 1.8 mg into the skin daily. 09/30/17   [provider]    Family History Family History  Problem Relation Age of Onset  . Heart attack Mother   . Hypertension Mother   . Depression Mother   . Neuropathy Father   . Diabetes Father   . Cancer Paternal Grandfather   . Stroke Maternal Grandmother   . Alcohol abuse Maternal Grandfather   . Breast cancer Neg Hx     Social History Social History   Tobacco Use  . Smoking status: Never Smoker  . Smokeless tobacco: Never Used  Substance Use Topics  . Alcohol use: Yes    Comment: socially, once per month  . Drug use: Yes    Types: Marijuana    Comment: prn when pain gets bad     Allergies   Mirabegron, Sulfa antibiotics, Elavil [amitriptyline hcl], and Lyrica [pregabalin]   Review of Systems Review of Systems  Reason unable to perform ROS: See HPI as above.     Physical Exam Triage Vital Signs ED Triage Vitals  Enc Vitals Group     BP 12/30/19 1902 122/85     Pulse Rate 12/30/19 1902 (!) 102     Resp 12/30/19 1902 16     Temp 12/30/19 1902 98 F (36.7 C)     Temp Source 12/30/19 1902 Temporal     SpO2 12/30/19 1902 97 %     Weight --      Height --      Head Circumference --      Peak Flow --      Pain Score 12/30/19 1906 5     Pain Loc --      Pain Edu? --      Excl. in [place]? --    No data found.  Updated Vital Signs BP 122/85 (BP Location: Left Arm)   Pulse (!) 102   Temp 98 F (36.7 C) (Temporal)   Resp 16   LMP 10/05/2013   SpO2 97%   Physical Exam Constitutional:      General: She is not in acute distress.    Appearance: She is well-developed. She is not ill-appearing, toxic-appearing or  diaphoretic.  HENT:     Head: Normocephalic and atraumatic.  Eyes:     General: Lids are normal.     Extraocular Movements: Extraocular movements intact.     Conjunctiva/sclera: Conjunctivae normal.     Pupils: Pupils are equal, round, and reactive to light.  Cardiovascular:     Rate and Rhythm: Normal rate and regular rhythm.  Pulmonary:     Effort: Pulmonary effort is normal. No respiratory distress.     Comments: LCTAB Abdominal:     General: Bowel sounds are increased.     Palpations: Abdomen is soft.     Tenderness: There is generalized abdominal tenderness. There is no right CVA tenderness, left CVA tenderness, guarding or rebound.  Musculoskeletal:     Cervical back: Normal range of motion and neck supple.  Skin:    General: Skin is warm and dry.  Neurological:     Mental Status: She is alert and oriented to person, place, and time.     GCS: GCS eye subscore is 4. GCS verbal subscore is 5. GCS motor subscore is 6.     Coordination: Coordination is intact.     Gait: Gait is intact.  Psychiatric:        Behavior: Behavior normal.        Judgment: Judgment normal.      UC Treatments / Results  Labs (all labs ordered are listed, but only abnormal results are displayed) Labs Reviewed  POCT URINALYSIS DIP (MANUAL ENTRY) - Abnormal; Notable for the following components:      Result Value   Spec Grav, UA >=1.030 (*)    All other components within normal limits  NOVEL CORONAVIRUS, NAA    EKG   Radiology No results found.  Procedures Procedures (including critical care time)  Medications Ordered in UC Medications  ketorolac (TORADOL) 30 MG/ML injection 15 mg (15 mg Intramuscular Given 12/30/19 1935)    Initial Impression / Assessment and Plan / UC Course  I have reviewed the triage vital signs and the nursing notes.  Pertinent labs & imaging results that were available during my care of the patient were reviewed by me and considered in my medical decision  making (see chart for details).    Given new cough, abdominal pain, diarrhea, will retest for Covid.  Discussed chronic symptoms since positive Covid test 09/2019 would not contribute to current test results.  Will provide symptomatic treatment with Bentyl, nasal sprays.  Push fluids.  Return precautions given.  Otherwise patient to follow-up with PCP for further evaluation of chronic symptoms if symptoms not improving.  Patient expresses understanding and agrees to plan.  Final Clinical Impressions(s) / UC Diagnoses   Final diagnoses:  Cough  Generalized abdominal pain  Diarrhea, unspecified type  Acute intractable headache, unspecified headache type    ED Prescriptions    Medication Sig Dispense Auth. Provider   dicyclomine (BENTYL) 20 MG tablet Take 1 tablet (20 mg total) by mouth 2 (two) times daily. 20 tablet Nayelly Laughman V, PA-C   azelastine (ASTELIN) 0.1 % nasal spray Place 2 sprays into both nostrils 2 (two) times daily. 30 mL Jermany Sundell V, PA-C   fluticasone (FLONASE) 50 MCG/ACT nasal spray Place 2 sprays into both nostrils daily. 1 g Ok Edwards, PA-C     PDMP not reviewed this encounter.   Ok Edwards, PA-C 12/30/19 1945

## 2019-12-30 NOTE — Discharge Instructions (Addendum)
COVID PCR testing ordered. I would like you to quarantine until testing results. Toradol injection in office today. No blood in urine, no infection. Did show you are dehydrated, please increase fluid intake. Azelastine, flonase for fluid behind the ear. This may also help with headache if headache is coming from sinus. If sudden worsening of symptoms, abdominal pain that is not relieving, worse headache, weakness, dizziness, passing out, go to the emergency department for further evaluation.  Otherwise, please follow up with PCP for further evaluation and management needed.

## 2019-12-30 NOTE — ED Triage Notes (Addendum)
Patient presents to [facility] for assessment of continued symptoms since her COVID diagnosis in December 2020.  Patient c/o headaches, 1 or more a week since.  Intermittent loss of taste and smell, sometimes it's gone for a meal, sometimes for a whole weak.  C/o intermittent nausea, which is worse with her headaches.  C/o upper abdominal pain, intermittent in nature.  C/o "deep cough".  Patient states she needs COVID testing to be able to see doctors, follow up with unemployment, and other ADLs.

## 2019-12-30 NOTE — ED Notes (Signed)
Patient able to ambulate independently  

## 2019-12-31 DIAGNOSIS — F320 Major depressive disorder, single episode, mild: Secondary | ICD-10-CM | POA: Diagnosis not present

## 2019-12-31 DIAGNOSIS — F902 Attention-deficit hyperactivity disorder, combined type: Secondary | ICD-10-CM | POA: Diagnosis not present

## 2020-01-01 ENCOUNTER — Ambulatory Visit
Admission: RE | Admit: 2020-01-01 | Discharge: 2020-01-01 | Disposition: A | Discharge status: Home / Self Care | Payer: BC Managed Care – PPO | Source: Ambulatory Visit | Attending: Internal Medicine | Admitting: Internal Medicine

## 2020-01-01 ENCOUNTER — Other Ambulatory Visit: Payer: Self-pay

## 2020-01-01 ENCOUNTER — Other Ambulatory Visit: Payer: Self-pay | Admitting: Internal Medicine

## 2020-01-01 DIAGNOSIS — N132 Hydronephrosis with renal and ureteral calculous obstruction: Secondary | ICD-10-CM | POA: Diagnosis not present

## 2020-01-01 DIAGNOSIS — R319 Hematuria, unspecified: Secondary | ICD-10-CM

## 2020-01-01 DIAGNOSIS — R109 Unspecified abdominal pain: Secondary | ICD-10-CM

## 2020-01-01 DIAGNOSIS — U071 COVID-19: Secondary | ICD-10-CM | POA: Diagnosis not present

## 2020-01-01 DIAGNOSIS — N200 Calculus of kidney: Secondary | ICD-10-CM | POA: Diagnosis not present

## 2020-01-01 DIAGNOSIS — R1111 Vomiting without nausea: Secondary | ICD-10-CM | POA: Diagnosis not present

## 2020-01-01 LAB — NOVEL CORONAVIRUS, NAA: SARS-CoV-2, NAA: NOT DETECTED

## 2020-01-02 DIAGNOSIS — N201 Calculus of ureter: Secondary | ICD-10-CM | POA: Diagnosis not present

## 2020-01-20 DIAGNOSIS — H04129 Dry eye syndrome of unspecified lacrimal gland: Secondary | ICD-10-CM | POA: Diagnosis not present

## 2020-01-20 DIAGNOSIS — E669 Obesity, unspecified: Secondary | ICD-10-CM | POA: Diagnosis not present

## 2020-01-20 DIAGNOSIS — Z8616 Personal history of COVID-19: Secondary | ICD-10-CM | POA: Diagnosis not present

## 2020-01-20 DIAGNOSIS — N1330 Unspecified hydronephrosis: Secondary | ICD-10-CM | POA: Diagnosis not present

## 2020-01-20 DIAGNOSIS — Z0000 Encounter for general adult medical examination without abnormal findings: Secondary | ICD-10-CM | POA: Diagnosis not present

## 2020-01-21 ENCOUNTER — Other Ambulatory Visit: Payer: Self-pay

## 2020-01-21 ENCOUNTER — Ambulatory Visit (HOSPITAL_COMMUNITY)
Admission: RE | Admit: 2020-01-21 | Discharge: 2020-01-21 | Disposition: A | Discharge status: Home / Self Care | Payer: BC Managed Care – PPO | Source: Ambulatory Visit | Attending: Nurse Practitioner | Admitting: Nurse Practitioner

## 2020-01-21 ENCOUNTER — Ambulatory Visit (INDEPENDENT_AMBULATORY_CARE_PROVIDER_SITE_OTHER): Payer: BC Managed Care – PPO | Admitting: Nurse Practitioner

## 2020-01-21 VITALS — BP 126/74 | HR 84 | Temp 97.5°F | Ht 63.0 in | Wt 177.8 lb

## 2020-01-21 DIAGNOSIS — R053 Chronic cough: Secondary | ICD-10-CM | POA: Insufficient documentation

## 2020-01-21 DIAGNOSIS — F902 Attention-deficit hyperactivity disorder, combined type: Secondary | ICD-10-CM | POA: Diagnosis not present

## 2020-01-21 DIAGNOSIS — H9313 Tinnitus, bilateral: Secondary | ICD-10-CM | POA: Insufficient documentation

## 2020-01-21 DIAGNOSIS — F320 Major depressive disorder, single episode, mild: Secondary | ICD-10-CM | POA: Diagnosis not present

## 2020-01-21 DIAGNOSIS — Z8616 Personal history of COVID-19: Secondary | ICD-10-CM | POA: Diagnosis not present

## 2020-01-21 DIAGNOSIS — R05 Cough: Secondary | ICD-10-CM | POA: Diagnosis not present

## 2020-01-21 DIAGNOSIS — G43909 Migraine, unspecified, not intractable, without status migrainosus: Secondary | ICD-10-CM

## 2020-01-21 MED ORDER — BENZONATATE 100 MG PO CAPS: 100.0000 mg | ORAL_CAPSULE | Freq: Two times a day (BID) | ORAL | 0 refills | Status: DC | PRN

## 2020-01-21 NOTE — Patient Instructions (Addendum)
Cough:  - will check chest x ray and call with results -will place referral to pulmonary for chronic cough  - May start gastroesophageal reflux disease treatment with elevating the head your bed and taking antacids - May start  taking over-the-counter antihistamines and nasal fluticasone to help with allergic rhinitis - You need to try to suppress your cough to allow your larynx (voice box) to heal.  For three days don't talk, laugh, sing, or clear your throat. Do everything you can to suppress the cough during this time. - Use hard candies (sugarless Jolly Ranchers) or non-mint or non-menthol containing cough drops during this time to soothe your throat.   - Use a cough suppressant (Delsym or what I have prescribed you) around the clock during this time.  After three days, gradually increase the use of your voice and back off on the cough suppressants.   Loss of taste and smell: - Information given on [facility] of Medicine Otolaryngology - Olfactory Retraining program   Fatigue: Stay well hydrated Continue vitamin D  Migraines: Continue current medications May start OTC magnesium  Ringing in ears: Will refer to ENT if this doesn't resolve soon    Follow up: Follow up in 2 weeks or sooner if needed   Fatigue If you have fatigue, you feel tired all the time and have a lack of energy or a lack of motivation. Fatigue may make it difficult to start or complete tasks because of exhaustion. In general, occasional or mild fatigue is often a normal response to activity or life. However, long-lasting (chronic) or extreme fatigue may be a symptom of a medical condition. Follow these instructions at home: General instructions  Watch your fatigue for any changes.  Go to bed and get up at the same time every day.  Avoid fatigue by pacing yourself during the day and getting enough sleep at night.  Maintain a healthy weight. Medicines  Take over-the-counter and  prescription medicines only as told by your health care provider.  Take a multivitamin, if told by your health care provider.  Do not use herbal or dietary supplements unless they are approved by your health care provider. Activity   Exercise regularly, as told by your health care provider.  Use or practice techniques to help you relax, such as yoga, tai chi, meditation, or massage therapy. Eating and drinking   Avoid heavy meals in the evening.  Eat a well-balanced diet, which includes lean proteins, whole grains, plenty of fruits and vegetables, and low-fat dairy products.  Avoid consuming too much caffeine.  Avoid the use of alcohol.  Drink enough fluid to keep your urine pale yellow. Lifestyle  Change situations that cause you stress. Try to keep your work and personal schedule in balance.  Do not use any products that contain nicotine or tobacco, such as cigarettes and e-cigarettes. If you need help quitting, ask your health care provider.  Do not use drugs. Contact a health care provider if:  Your fatigue does not get better.  You have a fever.  You suddenly lose or gain weight.  You have headaches.  You have trouble falling asleep or sleeping through the night.  You feel angry, guilty, anxious, or sad.  You are unable to have a bowel movement (constipation).  Your skin is dry.  You have swelling in your legs or another part of your body. Get help right away if:  You feel confused.  Your vision is blurry.  You feel faint  or you pass out.  You have a severe headache.  You have severe pain in your abdomen, your back, or the area between your waist and hips (pelvis).  You have chest pain, shortness of breath, or an irregular or fast heartbeat.  You are unable to urinate, or you urinate less than normal.  You have abnormal bleeding, such as bleeding from the rectum, vagina, nose, lungs, or nipples.  You vomit blood.  You have thoughts about  hurting yourself or others. If you ever feel like you may hurt yourself or others, or have thoughts about taking your own life, get help right away. You can go to your nearest emergency department or call:  Your local emergency services (911 in the [place]  A suicide crisis helpline, such as the [place] at 4698320249. This is open 24 hours a day. Summary  If you have fatigue, you feel tired all the time and have a lack of energy or a lack of motivation.  Fatigue may make it difficult to start or complete tasks because of exhaustion.  Long-lasting (chronic) or extreme fatigue may be a symptom of a medical condition.  Exercise regularly, as told by your health care provider.  Change situations that cause you stress. Try to keep your work and personal schedule in balance. This information is not intended to replace advice given to you by your health care provider. Make sure you discuss any questions you have with your health care provider. Document Revised: 04/24/2019 Document Reviewed: 06/28/2017 Elsevier Patient Education  2020 [employer].

## 2020-01-21 NOTE — Progress Notes (Signed)
@Patient  ID: [name] Still Montez Morita, female    DOB: [birth year], 49 y.o.   MRN: [id number]  Chief Complaint  Patient presents with  . Post COVID    Positive 09/2019. Patient complaining of multiple ongoing symptoms after having COVID. Including Headaches, taste and smell "come and go", nausea, cough, fatigue, hair loss. New Sx "not sure if COVID related": ringing in ears, teeth sensitivy, red rashes on scalp, torso and neck.     Referring provider: Creola Corn, MD  49 year old female with history of ADD, anxiety, chronic fatigue syndrome, depression, fibromyalgia, kidney stones, migraines, PTSD. Diagnosed with covid in December 2020.   HPI   Patient presents today for post covid care clinic visit.  Patient was diagnosed with Covid in December 2020.  She states that she is still experiencing multiple symptoms.  She states that her chronic cough, headaches, fatigue, loss of taste and smell are the most bothersome for her.  Patient does have a history of migraines but states that her headaches seem to be worse since she has had Covid.  She describes the headaches as being sharp in nature and traveling from frontal to posterior head.  She also states that she has had a chronic cough for over a year.  She states that she does get bronchitis often often.  She has not had an x-ray since being diagnosed with Covid.  Patient does also have chronic fatigue syndrome.  She also has vitamin D deficiency.  Patient has been seen by her primary care physician and has had recent lab work completed.  She states that overall her labs were normal. She also complains of loss of taste and smell and ears ringing. Denies f/c/s, n/v/d, hemoptysis, PND, chest pain or edema.    Note: patient walked in office today. Sats remained above 96% for entire walk. Heart rate remained stable.    Allergies  Allergen Reactions  . Mirabegron Swelling    Lower extremities   . Sulfa Antibiotics Hives    All over body  .  Elavil [Amitriptyline Hcl] Anxiety  . Lyrica [Pregabalin] Swelling and Rash    Lower extremities      There is no immunization history on file for this patient.  Past Medical History:  Diagnosis Date  . ADD (attention deficit disorder)   . Anxiety   . Arthropathy, unspecified, site unspecified   . Chronic fatigue syndrome   . Depression   . Disc degeneration   . Fibromyalgia   . History of kidney stones    last stone in 2010  . Migraines   . Multilevel degenerative disc disease    cervical and lumbar, has had injections/epidural  . Panic attack 2019  . PTSD (post-traumatic stress disorder) 2019  . Sleep disorder 01/08/2014  . Sleep disturbance, unspecified   . 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 Unspecified nonpsychotic mental disorder     Tobacco History: Social History   Tobacco Use  Smoking Status Never Smoker  Smokeless Tobacco Never Used   Counseling given: Yes   Outpatient Encounter Medications as of 01/21/2020  Medication Sig  . DULoxetine (CYMBALTA) 60 MG capsule Take 60 mg by mouth daily.   . fluticasone (FLONASE) 50 MCG/ACT nasal spray Place 2 sprays into both nostrils daily.  Marland Kitchen SAXENDA 18 MG/3ML SOPN Inject 1.8 mg into the skin daily.  Marland Kitchen topiramate (TOPAMAX) 50 MG tablet Take 50 mg by mouth at bedtime.  Marland Kitchen albuterol (PROVENTIL) (2.5 MG/3ML) 0.083% nebulizer solution Take 3 mLs (  2.5 mg total) by nebulization every 6 (six) hours as needed for wheezing or shortness of breath. (Patient not taking: Reported on 01/21/2020)  . azelastine (ASTELIN) 0.1 % nasal spray Place 2 sprays into both nostrils 2 (two) times daily. (Patient not taking: Reported on 01/21/2020)  . benzonatate (TESSALON) 100 MG capsule Take 1 capsule (100 mg total) by mouth 2 (two) times daily as needed for cough.  . chlordiazePOXIDE (LIBRIUM) 5 MG capsule Take 1 to 2 capsule by mouth everyday at bedtime  . clonazePAM (KLONOPIN) 1 MG tablet Take 1 mg by mouth 3 (three) times daily as needed.  .  diclofenac (VOLTAREN) 75 MG EC tablet Take 75 mg by mouth as needed.   . dicyclomine (BENTYL) 20 MG tablet Take 1 tablet (20 mg total) by mouth 2 (two) times daily.  . Insulin Pen Needle (BD PEN NEEDLE MICRO U/F) 32G X 6 MM MISC USE DAILY AS DIRECTED  . MURO 128 5 % ophthalmic ointment SMARTSIG:1 Inch(es) In Eye(s) Every Night  . VYVANSE 70 MG capsule Take 70 mg by mouth every morning.   No facility-administered encounter medications on file as of 01/21/2020.     Review of Systems  Review of Systems  Constitutional: Positive for fatigue. Negative for activity change and fever.  HENT: Positive for congestion and tinnitus.   Respiratory: Positive for cough. Negative for wheezing.   Cardiovascular: Negative for chest pain, palpitations and leg swelling.  Neurological: Positive for headaches.       Physical Exam  BP 126/74 (Patient Position: Sitting, Cuff Size: Small)   Pulse 84   Temp (!) 97.5 F (36.4 C)   Ht 5\' 3"  (1.6 m)   Wt 177 lb 12 oz (80.6 kg)   LMP 10/05/2013   SpO2 98%   BMI 31.49 kg/m   Wt Readings from Last 5 Encounters:  01/21/20 177 lb 12 oz (80.6 kg)  12/04/18 172 lb (78 kg)  10/26/18 176 lb (79.8 kg)  10/09/18 176 lb 4 oz (79.9 kg)  10/03/18 174 lb 13.2 oz (79.3 kg)     Physical Exam Vitals and nursing note reviewed.  Constitutional:      General: She is not in acute distress.    Appearance: She is well-developed.  Cardiovascular:     Rate and Rhythm: Normal rate and regular rhythm.  Pulmonary:     Effort: Pulmonary effort is normal.     Breath sounds: Normal breath sounds. No wheezing or rhonchi.  Musculoskeletal:     Right lower leg: No edema.     Left lower leg: No edema.  Neurological:     Mental Status: She is alert and oriented to person, place, and time.  Psychiatric:        Mood and Affect: Mood normal.        Behavior: Behavior normal.     Imaging: CT RENAL STONE STUDY  Result Date: 01/01/2020 CLINICAL DATA:  LEFT flank pain for  3 days with nausea, vomiting and diarrhea, past history of fibromyalgia, kidney stones, lumbar disc disease EXAM: CT ABDOMEN AND PELVIS WITHOUT CONTRAST TECHNIQUE: Multidetector CT imaging of the abdomen and pelvis was performed following the standard protocol without IV contrast. Oral contrast not administered for this indication sagittal and coronal MPR images reconstructed from axial data set. COMPARISON:  01/11/2016 FINDINGS: Lower chest: Lung bases clear Hepatobiliary: Gallbladder surgically absent. Liver normal appearance Pancreas: Normal appearance Spleen: Normal appearance Adrenals/Urinary Tract: Adrenal glands, LEFT kidney and LEFT ureter normal appearance. RIGHT hydronephrosis  and hydroureter secondary to a 3 mm LEFT ureterovesical junction calculus. Bladder otherwise unremarkable. No renal masses. Tiny nonobstructing calculi at superior and inferior poles of LEFT kidney. Stomach/Bowel: Normal appendix. Stomach and bowel loops normal appearance. Vascular/Lymphatic: Few scattered phleboliths. Aorta normal caliber. No adenopathy. Reproductive: Unremarkable LEFT ovary. Uterus and RIGHT ovary surgically absent Other: No free air or free fluid. No hernia or inflammatory process. Musculoskeletal: Unremarkable IMPRESSION: LEFT hydronephrosis and hydroureter secondary to a 3 mm LEFT ureterovesical junction calculus. Additional tiny nonobstructing LEFT renal calculi. Electronically Signed   By: Ulyses Southward M.D.   On: 01/01/2020 14:30     Assessment & Plan:   History of COVID-19 Cough:  - will check chest x ray and call with results - will place referral to pulmonary for chronic cough  - May start gastroesophageal reflux disease treatment with elevating the head your bed and taking antacids - May start  taking over-the-counter antihistamines and nasal fluticasone to help with allergic rhinitis - You need to try to suppress your cough to allow your larynx (voice box) to heal.  For three days don't talk,  laugh, sing, or clear your throat. Do everything you can to suppress the cough during this time. - Use hard candies (sugarless Jolly Ranchers) or non-mint or non-menthol containing cough drops during this time to soothe your throat.   - Use a cough suppressant (Delsym or what I have prescribed you) around the clock during this time.  After three days, gradually increase the use of your voice and back off on the cough suppressants.   Loss of taste and smell: - Information given on [facility] of Medicine Otolaryngology - Olfactory Retraining program   Fatigue: Stay well hydrated Continue vitamin D  Migraines: Continue current medications May start OTC magnesium  Ringing in ears: Will refer to ENT if this doesn't resolve soon      Patient Instructions  Cough:  - will check chest x ray and call with results -will place referral to pulmonary for chronic cough  - May start gastroesophageal reflux disease treatment with elevating the head your bed and taking antacids - May start  taking over-the-counter antihistamines and nasal fluticasone to help with allergic rhinitis - You need to try to suppress your cough to allow your larynx (voice box) to heal.  For three days don't talk, laugh, sing, or clear your throat. Do everything you can to suppress the cough during this time. - Use hard candies (sugarless Jolly Ranchers) or non-mint or non-menthol containing cough drops during this time to soothe your throat.   - Use a cough suppressant (Delsym or what I have prescribed you) around the clock during this time.  After three days, gradually increase the use of your voice and back off on the cough suppressants.   Loss of taste and smell: - Information given on [facility] of Medicine Otolaryngology - Olfactory Retraining program   Fatigue: Stay well hydrated Continue vitamin D  Migraines: Continue current medications May start OTC magnesium  Ringing  in ears: Will refer to ENT if this doesn't resolve soon    Follow up: Follow up in 2 weeks or sooner if needed   Fatigue If you have fatigue, you feel tired all the time and have a lack of energy or a lack of motivation. Fatigue may make it difficult to start or complete tasks because of exhaustion. In general, occasional or mild fatigue is often a normal response to activity or life. However,  long-lasting (chronic) or extreme fatigue may be a symptom of a medical condition. Follow these instructions at home: General instructions  Watch your fatigue for any changes.  Go to bed and get up at the same time every day.  Avoid fatigue by pacing yourself during the day and getting enough sleep at night.  Maintain a healthy weight. Medicines  Take over-the-counter and prescription medicines only as told by your health care provider.  Take a multivitamin, if told by your health care provider.  Do not use herbal or dietary supplements unless they are approved by your health care provider. Activity   Exercise regularly, as told by your health care provider.  Use or practice techniques to help you relax, such as yoga, tai chi, meditation, or massage therapy. Eating and drinking   Avoid heavy meals in the evening.  Eat a well-balanced diet, which includes lean proteins, whole grains, plenty of fruits and vegetables, and low-fat dairy products.  Avoid consuming too much caffeine.  Avoid the use of alcohol.  Drink enough fluid to keep your urine pale yellow. Lifestyle  Change situations that cause you stress. Try to keep your work and personal schedule in balance.  Do not use any products that contain nicotine or tobacco, such as cigarettes and e-cigarettes. If you need help quitting, ask your health care provider.  Do not use drugs. Contact a health care provider if:  Your fatigue does not get better.  You have a fever.  You suddenly lose or gain weight.  You have  headaches.  You have trouble falling asleep or sleeping through the night.  You feel angry, guilty, anxious, or sad.  You are unable to have a bowel movement (constipation).  Your skin is dry.  You have swelling in your legs or another part of your body. Get help right away if:  You feel confused.  Your vision is blurry.  You feel faint or you pass out.  You have a severe headache.  You have severe pain in your abdomen, your back, or the area between your waist and hips (pelvis).  You have chest pain, shortness of breath, or an irregular or fast heartbeat.  You are unable to urinate, or you urinate less than normal.  You have abnormal bleeding, such as bleeding from the rectum, vagina, nose, lungs, or nipples.  You vomit blood.  You have thoughts about hurting yourself or others. If you ever feel like you may hurt yourself or others, or have thoughts about taking your own life, get help right away. You can go to your nearest emergency department or call:  Your local emergency services (911 in the [place]  A suicide crisis helpline, such as the National Suicide Prevention Lifeline at 772-507-3016. This is open 24 hours a day. Summary  If you have fatigue, you feel tired all the time and have a lack of energy or a lack of motivation.  Fatigue may make it difficult to start or complete tasks because of exhaustion.  Long-lasting (chronic) or extreme fatigue may be a symptom of a medical condition.  Exercise regularly, as told by your health care provider.  Change situations that cause you stress. Try to keep your work and personal schedule in balance. This information is not intended to replace advice given to you by your health care provider. Make sure you discuss any questions you have with your health care provider. Document Revised: 04/24/2019 Document Reviewed: 06/28/2017 Elsevier Patient Education  2020 [employer].  Ivonne Andrew, NP 01/21/2020

## 2020-01-21 NOTE — Assessment & Plan Note (Signed)
Cough:  - will check chest x ray and call with results - will place referral to pulmonary for chronic cough  - May start gastroesophageal reflux disease treatment with elevating the head your bed and taking antacids - May start  taking over-the-counter antihistamines and nasal fluticasone to help with allergic rhinitis - You need to try to suppress your cough to allow your larynx (voice box) to heal.  For three days don't talk, laugh, sing, or clear your throat. Do everything you can to suppress the cough during this time. - Use hard candies (sugarless Jolly Ranchers) or non-mint or non-menthol containing cough drops during this time to soothe your throat.   - Use a cough suppressant (Delsym or what I have prescribed you) around the clock during this time.  After three days, gradually increase the use of your voice and back off on the cough suppressants.   Loss of taste and smell: - Information given on [facility] of Medicine Otolaryngology - Olfactory Retraining program   Fatigue: Stay well hydrated Continue vitamin D  Migraines: Continue current medications May start OTC magnesium  Ringing in ears: Will refer to ENT if this doesn't resolve soon

## 2020-01-22 NOTE — Progress Notes (Signed)
Left voicemail with results, asking patient to call back with additional questions or concerns.

## 2020-02-11 ENCOUNTER — Ambulatory Visit: Payer: BC Managed Care – PPO

## 2020-02-11 DIAGNOSIS — F902 Attention-deficit hyperactivity disorder, combined type: Secondary | ICD-10-CM | POA: Diagnosis not present

## 2020-02-11 DIAGNOSIS — F320 Major depressive disorder, single episode, mild: Secondary | ICD-10-CM | POA: Diagnosis not present

## 2020-02-18 DIAGNOSIS — F320 Major depressive disorder, single episode, mild: Secondary | ICD-10-CM | POA: Diagnosis not present

## 2020-02-18 DIAGNOSIS — F902 Attention-deficit hyperactivity disorder, combined type: Secondary | ICD-10-CM | POA: Diagnosis not present

## 2020-02-19 DIAGNOSIS — F341 Dysthymic disorder: Secondary | ICD-10-CM | POA: Diagnosis not present

## 2020-02-25 DIAGNOSIS — F902 Attention-deficit hyperactivity disorder, combined type: Secondary | ICD-10-CM | POA: Diagnosis not present

## 2020-02-25 DIAGNOSIS — F320 Major depressive disorder, single episode, mild: Secondary | ICD-10-CM | POA: Diagnosis not present

## 2020-03-24 DIAGNOSIS — F320 Major depressive disorder, single episode, mild: Secondary | ICD-10-CM | POA: Diagnosis not present

## 2020-03-24 DIAGNOSIS — F902 Attention-deficit hyperactivity disorder, combined type: Secondary | ICD-10-CM | POA: Diagnosis not present

## 2020-03-25 DIAGNOSIS — M545 Low back pain: Secondary | ICD-10-CM | POA: Diagnosis not present

## 2020-03-25 DIAGNOSIS — M546 Pain in thoracic spine: Secondary | ICD-10-CM | POA: Diagnosis not present

## 2020-03-30 DIAGNOSIS — F339 Major depressive disorder, recurrent, unspecified: Secondary | ICD-10-CM | POA: Diagnosis not present

## 2020-03-30 DIAGNOSIS — M159 Polyosteoarthritis, unspecified: Secondary | ICD-10-CM | POA: Diagnosis not present

## 2020-03-30 DIAGNOSIS — G4700 Insomnia, unspecified: Secondary | ICD-10-CM | POA: Diagnosis not present

## 2020-03-30 DIAGNOSIS — G43001 Migraine without aura, not intractable, with status migrainosus: Secondary | ICD-10-CM | POA: Diagnosis not present

## 2020-03-31 DIAGNOSIS — F902 Attention-deficit hyperactivity disorder, combined type: Secondary | ICD-10-CM | POA: Diagnosis not present

## 2020-03-31 DIAGNOSIS — F320 Major depressive disorder, single episode, mild: Secondary | ICD-10-CM | POA: Diagnosis not present

## 2020-04-06 DIAGNOSIS — F314 Bipolar disorder, current episode depressed, severe, without psychotic features: Secondary | ICD-10-CM | POA: Diagnosis not present

## 2020-04-06 DIAGNOSIS — F900 Attention-deficit hyperactivity disorder, predominantly inattentive type: Secondary | ICD-10-CM | POA: Diagnosis not present

## 2020-04-06 DIAGNOSIS — F3341 Major depressive disorder, recurrent, in partial remission: Secondary | ICD-10-CM | POA: Diagnosis not present

## 2020-04-06 DIAGNOSIS — F410 Panic disorder [episodic paroxysmal anxiety] without agoraphobia: Secondary | ICD-10-CM | POA: Diagnosis not present

## 2020-04-07 DIAGNOSIS — F902 Attention-deficit hyperactivity disorder, combined type: Secondary | ICD-10-CM | POA: Diagnosis not present

## 2020-04-07 DIAGNOSIS — F320 Major depressive disorder, single episode, mild: Secondary | ICD-10-CM | POA: Diagnosis not present

## 2020-04-09 DIAGNOSIS — F3341 Major depressive disorder, recurrent, in partial remission: Secondary | ICD-10-CM | POA: Diagnosis not present

## 2020-04-14 DIAGNOSIS — F320 Major depressive disorder, single episode, mild: Secondary | ICD-10-CM | POA: Diagnosis not present

## 2020-04-14 DIAGNOSIS — F902 Attention-deficit hyperactivity disorder, combined type: Secondary | ICD-10-CM | POA: Diagnosis not present

## 2020-04-28 DIAGNOSIS — F320 Major depressive disorder, single episode, mild: Secondary | ICD-10-CM | POA: Diagnosis not present

## 2020-04-28 DIAGNOSIS — F902 Attention-deficit hyperactivity disorder, combined type: Secondary | ICD-10-CM | POA: Diagnosis not present

## 2020-05-05 DIAGNOSIS — F902 Attention-deficit hyperactivity disorder, combined type: Secondary | ICD-10-CM | POA: Diagnosis not present

## 2020-05-05 DIAGNOSIS — F320 Major depressive disorder, single episode, mild: Secondary | ICD-10-CM | POA: Diagnosis not present

## 2020-05-12 DIAGNOSIS — F902 Attention-deficit hyperactivity disorder, combined type: Secondary | ICD-10-CM | POA: Diagnosis not present

## 2020-05-12 DIAGNOSIS — F320 Major depressive disorder, single episode, mild: Secondary | ICD-10-CM | POA: Diagnosis not present

## 2020-05-19 DIAGNOSIS — F902 Attention-deficit hyperactivity disorder, combined type: Secondary | ICD-10-CM | POA: Diagnosis not present

## 2020-05-19 DIAGNOSIS — F320 Major depressive disorder, single episode, mild: Secondary | ICD-10-CM | POA: Diagnosis not present

## 2020-05-26 DIAGNOSIS — F902 Attention-deficit hyperactivity disorder, combined type: Secondary | ICD-10-CM | POA: Diagnosis not present

## 2020-05-26 DIAGNOSIS — F320 Major depressive disorder, single episode, mild: Secondary | ICD-10-CM | POA: Diagnosis not present

## 2020-06-02 ENCOUNTER — Ambulatory Visit
Admission: EM | Admit: 2020-06-02 | Discharge: 2020-06-02 | Disposition: A | Discharge status: Home / Self Care | Payer: BC Managed Care – PPO | Attending: Physician Assistant | Admitting: Physician Assistant

## 2020-06-02 DIAGNOSIS — Z1152 Encounter for screening for COVID-19: Secondary | ICD-10-CM | POA: Diagnosis not present

## 2020-06-02 DIAGNOSIS — F902 Attention-deficit hyperactivity disorder, combined type: Secondary | ICD-10-CM | POA: Diagnosis not present

## 2020-06-02 DIAGNOSIS — F320 Major depressive disorder, single episode, mild: Secondary | ICD-10-CM | POA: Diagnosis not present

## 2020-06-02 NOTE — Discharge Instructions (Signed)

## 2020-06-02 NOTE — ED Triage Notes (Signed)
Pt requesting covid testing. States positive exposure last Thursday. Denies s/sx's.

## 2020-06-04 LAB — NOVEL CORONAVIRUS, NAA: SARS-CoV-2, NAA: NOT DETECTED

## 2020-06-04 LAB — SARS-COV-2, NAA 2 DAY TAT

## 2020-06-05 DIAGNOSIS — F3342 Major depressive disorder, recurrent, in full remission: Secondary | ICD-10-CM | POA: Diagnosis not present

## 2020-06-09 DIAGNOSIS — F902 Attention-deficit hyperactivity disorder, combined type: Secondary | ICD-10-CM | POA: Diagnosis not present

## 2020-06-09 DIAGNOSIS — F320 Major depressive disorder, single episode, mild: Secondary | ICD-10-CM | POA: Diagnosis not present

## 2020-06-16 DIAGNOSIS — F902 Attention-deficit hyperactivity disorder, combined type: Secondary | ICD-10-CM | POA: Diagnosis not present

## 2020-06-16 DIAGNOSIS — F320 Major depressive disorder, single episode, mild: Secondary | ICD-10-CM | POA: Diagnosis not present

## 2020-06-23 DIAGNOSIS — H04123 Dry eye syndrome of bilateral lacrimal glands: Secondary | ICD-10-CM | POA: Diagnosis not present

## 2020-06-23 DIAGNOSIS — H25813 Combined forms of age-related cataract, bilateral: Secondary | ICD-10-CM | POA: Diagnosis not present

## 2020-06-23 DIAGNOSIS — H0015 Chalazion left lower eyelid: Secondary | ICD-10-CM | POA: Diagnosis not present

## 2020-06-23 DIAGNOSIS — H0012 Chalazion right lower eyelid: Secondary | ICD-10-CM | POA: Diagnosis not present

## 2020-07-02 DIAGNOSIS — F320 Major depressive disorder, single episode, mild: Secondary | ICD-10-CM | POA: Diagnosis not present

## 2020-07-02 DIAGNOSIS — F902 Attention-deficit hyperactivity disorder, combined type: Secondary | ICD-10-CM | POA: Diagnosis not present

## 2020-07-06 ENCOUNTER — Other Ambulatory Visit (HOSPITAL_BASED_OUTPATIENT_CLINIC_OR_DEPARTMENT_OTHER): Payer: Self-pay | Admitting: Family Medicine

## 2020-07-06 ENCOUNTER — Ambulatory Visit (HOSPITAL_BASED_OUTPATIENT_CLINIC_OR_DEPARTMENT_OTHER)
Admission: RE | Admit: 2020-07-06 | Discharge: 2020-07-06 | Disposition: A | Discharge status: Home / Self Care | Payer: BC Managed Care – PPO | Source: Ambulatory Visit | Attending: Family Medicine | Admitting: Family Medicine

## 2020-07-06 ENCOUNTER — Other Ambulatory Visit: Payer: Self-pay

## 2020-07-06 DIAGNOSIS — R1030 Lower abdominal pain, unspecified: Secondary | ICD-10-CM | POA: Insufficient documentation

## 2020-07-06 DIAGNOSIS — K649 Unspecified hemorrhoids: Secondary | ICD-10-CM | POA: Diagnosis not present

## 2020-07-06 DIAGNOSIS — N390 Urinary tract infection, site not specified: Secondary | ICD-10-CM | POA: Diagnosis not present

## 2020-07-06 DIAGNOSIS — K921 Melena: Secondary | ICD-10-CM | POA: Diagnosis not present

## 2020-07-06 MED ORDER — IOHEXOL 300 MG/ML  SOLN
100.0000 mL | Freq: Once | INTRAMUSCULAR | Status: AC | PRN
  Administered 2020-07-06: 100 mL via INTRAVENOUS

## 2020-07-09 DIAGNOSIS — Z1211 Encounter for screening for malignant neoplasm of colon: Secondary | ICD-10-CM | POA: Diagnosis not present

## 2020-07-09 DIAGNOSIS — K529 Noninfective gastroenteritis and colitis, unspecified: Secondary | ICD-10-CM | POA: Diagnosis not present

## 2020-07-09 DIAGNOSIS — R109 Unspecified abdominal pain: Secondary | ICD-10-CM | POA: Diagnosis not present

## 2020-07-09 DIAGNOSIS — R933 Abnormal findings on diagnostic imaging of other parts of digestive tract: Secondary | ICD-10-CM | POA: Diagnosis not present

## 2020-07-21 DIAGNOSIS — F320 Major depressive disorder, single episode, mild: Secondary | ICD-10-CM | POA: Diagnosis not present

## 2020-07-21 DIAGNOSIS — F902 Attention-deficit hyperactivity disorder, combined type: Secondary | ICD-10-CM | POA: Diagnosis not present

## 2020-07-27 DIAGNOSIS — E669 Obesity, unspecified: Secondary | ICD-10-CM | POA: Diagnosis not present

## 2020-07-27 DIAGNOSIS — E079 Disorder of thyroid, unspecified: Secondary | ICD-10-CM | POA: Diagnosis not present

## 2020-07-27 DIAGNOSIS — Z6830 Body mass index (BMI) 30.0-30.9, adult: Secondary | ICD-10-CM | POA: Diagnosis not present

## 2020-07-28 DIAGNOSIS — F902 Attention-deficit hyperactivity disorder, combined type: Secondary | ICD-10-CM | POA: Diagnosis not present

## 2020-07-28 DIAGNOSIS — F320 Major depressive disorder, single episode, mild: Secondary | ICD-10-CM | POA: Diagnosis not present

## 2020-07-29 DIAGNOSIS — H0012 Chalazion right lower eyelid: Secondary | ICD-10-CM | POA: Diagnosis not present

## 2020-08-03 DIAGNOSIS — R933 Abnormal findings on diagnostic imaging of other parts of digestive tract: Secondary | ICD-10-CM | POA: Diagnosis not present

## 2020-08-03 DIAGNOSIS — Z1211 Encounter for screening for malignant neoplasm of colon: Secondary | ICD-10-CM | POA: Diagnosis not present

## 2020-08-04 DIAGNOSIS — F320 Major depressive disorder, single episode, mild: Secondary | ICD-10-CM | POA: Diagnosis not present

## 2020-08-04 DIAGNOSIS — F902 Attention-deficit hyperactivity disorder, combined type: Secondary | ICD-10-CM | POA: Diagnosis not present

## 2020-08-18 DIAGNOSIS — F320 Major depressive disorder, single episode, mild: Secondary | ICD-10-CM | POA: Diagnosis not present

## 2020-08-18 DIAGNOSIS — F902 Attention-deficit hyperactivity disorder, combined type: Secondary | ICD-10-CM | POA: Diagnosis not present

## 2020-08-19 DIAGNOSIS — M791 Myalgia, unspecified site: Secondary | ICD-10-CM | POA: Diagnosis not present

## 2020-08-19 DIAGNOSIS — M542 Cervicalgia: Secondary | ICD-10-CM | POA: Diagnosis not present

## 2020-08-19 DIAGNOSIS — M545 Low back pain, unspecified: Secondary | ICD-10-CM | POA: Diagnosis not present

## 2020-08-27 DIAGNOSIS — F320 Major depressive disorder, single episode, mild: Secondary | ICD-10-CM | POA: Diagnosis not present

## 2020-08-27 DIAGNOSIS — F902 Attention-deficit hyperactivity disorder, combined type: Secondary | ICD-10-CM | POA: Diagnosis not present

## 2020-08-31 DIAGNOSIS — M545 Low back pain, unspecified: Secondary | ICD-10-CM | POA: Diagnosis not present

## 2020-08-31 DIAGNOSIS — M791 Myalgia, unspecified site: Secondary | ICD-10-CM | POA: Diagnosis not present

## 2020-08-31 DIAGNOSIS — M542 Cervicalgia: Secondary | ICD-10-CM | POA: Diagnosis not present

## 2020-09-01 DIAGNOSIS — F320 Major depressive disorder, single episode, mild: Secondary | ICD-10-CM | POA: Diagnosis not present

## 2020-09-01 DIAGNOSIS — F902 Attention-deficit hyperactivity disorder, combined type: Secondary | ICD-10-CM | POA: Diagnosis not present

## 2020-09-02 DIAGNOSIS — H0015 Chalazion left lower eyelid: Secondary | ICD-10-CM | POA: Diagnosis not present

## 2020-09-02 DIAGNOSIS — H25813 Combined forms of age-related cataract, bilateral: Secondary | ICD-10-CM | POA: Diagnosis not present

## 2020-09-02 DIAGNOSIS — H1045 Other chronic allergic conjunctivitis: Secondary | ICD-10-CM | POA: Diagnosis not present

## 2020-09-02 DIAGNOSIS — H04123 Dry eye syndrome of bilateral lacrimal glands: Secondary | ICD-10-CM | POA: Diagnosis not present

## 2020-09-08 DIAGNOSIS — M5106 Intervertebral disc disorders with myelopathy, lumbar region: Secondary | ICD-10-CM | POA: Diagnosis not present

## 2020-09-08 DIAGNOSIS — M545 Low back pain, unspecified: Secondary | ICD-10-CM | POA: Diagnosis not present

## 2020-09-08 DIAGNOSIS — M170 Bilateral primary osteoarthritis of knee: Secondary | ICD-10-CM | POA: Diagnosis not present

## 2020-09-15 DIAGNOSIS — M542 Cervicalgia: Secondary | ICD-10-CM | POA: Diagnosis not present

## 2020-09-15 DIAGNOSIS — M545 Low back pain, unspecified: Secondary | ICD-10-CM | POA: Diagnosis not present

## 2020-09-15 DIAGNOSIS — F902 Attention-deficit hyperactivity disorder, combined type: Secondary | ICD-10-CM | POA: Diagnosis not present

## 2020-09-15 DIAGNOSIS — F320 Major depressive disorder, single episode, mild: Secondary | ICD-10-CM | POA: Diagnosis not present

## 2020-09-18 DIAGNOSIS — H25811 Combined forms of age-related cataract, right eye: Secondary | ICD-10-CM | POA: Diagnosis not present

## 2020-09-22 DIAGNOSIS — F320 Major depressive disorder, single episode, mild: Secondary | ICD-10-CM | POA: Diagnosis not present

## 2020-09-22 DIAGNOSIS — M545 Low back pain, unspecified: Secondary | ICD-10-CM | POA: Diagnosis not present

## 2020-09-22 DIAGNOSIS — M542 Cervicalgia: Secondary | ICD-10-CM | POA: Diagnosis not present

## 2020-09-22 DIAGNOSIS — F902 Attention-deficit hyperactivity disorder, combined type: Secondary | ICD-10-CM | POA: Diagnosis not present

## 2020-09-24 DIAGNOSIS — M545 Low back pain, unspecified: Secondary | ICD-10-CM | POA: Diagnosis not present

## 2020-09-24 DIAGNOSIS — E079 Disorder of thyroid, unspecified: Secondary | ICD-10-CM | POA: Diagnosis not present

## 2020-09-24 DIAGNOSIS — Z6827 Body mass index (BMI) 27.0-27.9, adult: Secondary | ICD-10-CM | POA: Diagnosis not present

## 2020-09-24 DIAGNOSIS — M542 Cervicalgia: Secondary | ICD-10-CM | POA: Diagnosis not present

## 2020-09-24 DIAGNOSIS — M5126 Other intervertebral disc displacement, lumbar region: Secondary | ICD-10-CM | POA: Diagnosis not present

## 2020-09-24 DIAGNOSIS — E663 Overweight: Secondary | ICD-10-CM | POA: Diagnosis not present

## 2020-09-29 DIAGNOSIS — F902 Attention-deficit hyperactivity disorder, combined type: Secondary | ICD-10-CM | POA: Diagnosis not present

## 2020-09-29 DIAGNOSIS — F320 Major depressive disorder, single episode, mild: Secondary | ICD-10-CM | POA: Diagnosis not present

## 2020-09-30 DIAGNOSIS — M791 Myalgia, unspecified site: Secondary | ICD-10-CM | POA: Diagnosis not present

## 2020-09-30 DIAGNOSIS — M545 Low back pain, unspecified: Secondary | ICD-10-CM | POA: Diagnosis not present

## 2020-09-30 DIAGNOSIS — M542 Cervicalgia: Secondary | ICD-10-CM | POA: Diagnosis not present

## 2020-10-12 DIAGNOSIS — M5126 Other intervertebral disc displacement, lumbar region: Secondary | ICD-10-CM | POA: Diagnosis not present

## 2020-10-13 DIAGNOSIS — H2512 Age-related nuclear cataract, left eye: Secondary | ICD-10-CM | POA: Diagnosis not present

## 2020-10-14 ENCOUNTER — Encounter: Payer: Self-pay | Admitting: Plastic Surgery

## 2020-10-14 ENCOUNTER — Ambulatory Visit (INDEPENDENT_AMBULATORY_CARE_PROVIDER_SITE_OTHER): Payer: Self-pay | Admitting: Plastic Surgery

## 2020-10-14 ENCOUNTER — Other Ambulatory Visit: Payer: Self-pay

## 2020-10-14 VITALS — BP 133/86 | HR 88 | Temp 98.0°F

## 2020-10-14 DIAGNOSIS — Z411 Encounter for cosmetic surgery: Secondary | ICD-10-CM

## 2020-10-14 NOTE — Progress Notes (Signed)
Patient presents to discuss Botox treatment.  She has had in the past and interested in more treatment around her forehead glabella).  On exam she has dynamic and static righted's problem throughout section.  We discussed the risks and benefits of neuromodulator treatment and she agreed to proceed.  The face was prepped with alcohol pad and 36 units of Botox were distributed throughout the crows feet, glabella and forehead.  She tolerated this well.  We will plan to see her again at her next visit

## 2020-10-16 DIAGNOSIS — H25812 Combined forms of age-related cataract, left eye: Secondary | ICD-10-CM | POA: Diagnosis not present

## 2020-10-23 ENCOUNTER — Encounter: Payer: Self-pay | Admitting: Plastic Surgery

## 2020-10-27 DIAGNOSIS — F902 Attention-deficit hyperactivity disorder, combined type: Secondary | ICD-10-CM | POA: Diagnosis not present

## 2020-10-27 DIAGNOSIS — F320 Major depressive disorder, single episode, mild: Secondary | ICD-10-CM | POA: Diagnosis not present

## 2020-11-03 DIAGNOSIS — F902 Attention-deficit hyperactivity disorder, combined type: Secondary | ICD-10-CM | POA: Diagnosis not present

## 2020-11-03 DIAGNOSIS — F320 Major depressive disorder, single episode, mild: Secondary | ICD-10-CM | POA: Diagnosis not present

## 2020-11-05 DIAGNOSIS — M542 Cervicalgia: Secondary | ICD-10-CM | POA: Diagnosis not present

## 2020-11-05 DIAGNOSIS — M545 Low back pain, unspecified: Secondary | ICD-10-CM | POA: Diagnosis not present

## 2020-11-05 DIAGNOSIS — M5126 Other intervertebral disc displacement, lumbar region: Secondary | ICD-10-CM | POA: Diagnosis not present

## 2020-11-10 DIAGNOSIS — F902 Attention-deficit hyperactivity disorder, combined type: Secondary | ICD-10-CM | POA: Diagnosis not present

## 2020-11-10 DIAGNOSIS — F320 Major depressive disorder, single episode, mild: Secondary | ICD-10-CM | POA: Diagnosis not present

## 2020-11-17 DIAGNOSIS — F902 Attention-deficit hyperactivity disorder, combined type: Secondary | ICD-10-CM | POA: Diagnosis not present

## 2020-11-17 DIAGNOSIS — F320 Major depressive disorder, single episode, mild: Secondary | ICD-10-CM | POA: Diagnosis not present

## 2020-11-24 DIAGNOSIS — F320 Major depressive disorder, single episode, mild: Secondary | ICD-10-CM | POA: Diagnosis not present

## 2020-11-24 DIAGNOSIS — F902 Attention-deficit hyperactivity disorder, combined type: Secondary | ICD-10-CM | POA: Diagnosis not present

## 2020-11-30 DIAGNOSIS — F410 Panic disorder [episodic paroxysmal anxiety] without agoraphobia: Secondary | ICD-10-CM | POA: Diagnosis not present

## 2020-11-30 DIAGNOSIS — F3342 Major depressive disorder, recurrent, in full remission: Secondary | ICD-10-CM | POA: Diagnosis not present

## 2020-12-01 DIAGNOSIS — F902 Attention-deficit hyperactivity disorder, combined type: Secondary | ICD-10-CM | POA: Diagnosis not present

## 2020-12-01 DIAGNOSIS — F320 Major depressive disorder, single episode, mild: Secondary | ICD-10-CM | POA: Diagnosis not present

## 2020-12-07 DIAGNOSIS — Z6831 Body mass index (BMI) 31.0-31.9, adult: Secondary | ICD-10-CM | POA: Diagnosis not present

## 2020-12-07 DIAGNOSIS — E669 Obesity, unspecified: Secondary | ICD-10-CM | POA: Diagnosis not present

## 2020-12-08 DIAGNOSIS — F902 Attention-deficit hyperactivity disorder, combined type: Secondary | ICD-10-CM | POA: Diagnosis not present

## 2020-12-08 DIAGNOSIS — F320 Major depressive disorder, single episode, mild: Secondary | ICD-10-CM | POA: Diagnosis not present

## 2020-12-15 DIAGNOSIS — F902 Attention-deficit hyperactivity disorder, combined type: Secondary | ICD-10-CM | POA: Diagnosis not present

## 2020-12-15 DIAGNOSIS — Z79899 Other long term (current) drug therapy: Secondary | ICD-10-CM | POA: Diagnosis not present

## 2020-12-15 DIAGNOSIS — G43719 Chronic migraine without aura, intractable, without status migrainosus: Secondary | ICD-10-CM | POA: Diagnosis not present

## 2020-12-15 DIAGNOSIS — F320 Major depressive disorder, single episode, mild: Secondary | ICD-10-CM | POA: Diagnosis not present

## 2020-12-15 DIAGNOSIS — Z049 Encounter for examination and observation for unspecified reason: Secondary | ICD-10-CM | POA: Diagnosis not present

## 2020-12-22 DIAGNOSIS — F902 Attention-deficit hyperactivity disorder, combined type: Secondary | ICD-10-CM | POA: Diagnosis not present

## 2020-12-22 DIAGNOSIS — F320 Major depressive disorder, single episode, mild: Secondary | ICD-10-CM | POA: Diagnosis not present

## 2021-01-05 DIAGNOSIS — M542 Cervicalgia: Secondary | ICD-10-CM | POA: Diagnosis not present

## 2021-01-05 DIAGNOSIS — F902 Attention-deficit hyperactivity disorder, combined type: Secondary | ICD-10-CM | POA: Diagnosis not present

## 2021-01-05 DIAGNOSIS — M791 Myalgia, unspecified site: Secondary | ICD-10-CM | POA: Diagnosis not present

## 2021-01-05 DIAGNOSIS — F320 Major depressive disorder, single episode, mild: Secondary | ICD-10-CM | POA: Diagnosis not present

## 2021-01-05 DIAGNOSIS — G43719 Chronic migraine without aura, intractable, without status migrainosus: Secondary | ICD-10-CM | POA: Diagnosis not present

## 2021-01-05 DIAGNOSIS — G518 Other disorders of facial nerve: Secondary | ICD-10-CM | POA: Diagnosis not present

## 2021-01-12 DIAGNOSIS — E785 Hyperlipidemia, unspecified: Secondary | ICD-10-CM | POA: Diagnosis not present

## 2021-01-12 DIAGNOSIS — F902 Attention-deficit hyperactivity disorder, combined type: Secondary | ICD-10-CM | POA: Diagnosis not present

## 2021-01-12 DIAGNOSIS — M797 Fibromyalgia: Secondary | ICD-10-CM | POA: Diagnosis not present

## 2021-01-12 DIAGNOSIS — E559 Vitamin D deficiency, unspecified: Secondary | ICD-10-CM | POA: Diagnosis not present

## 2021-01-12 DIAGNOSIS — M7989 Other specified soft tissue disorders: Secondary | ICD-10-CM | POA: Diagnosis not present

## 2021-01-12 DIAGNOSIS — M2550 Pain in unspecified joint: Secondary | ICD-10-CM | POA: Diagnosis not present

## 2021-01-12 DIAGNOSIS — M549 Dorsalgia, unspecified: Secondary | ICD-10-CM | POA: Diagnosis not present

## 2021-01-12 DIAGNOSIS — F320 Major depressive disorder, single episode, mild: Secondary | ICD-10-CM | POA: Diagnosis not present

## 2021-01-19 DIAGNOSIS — R82998 Other abnormal findings in urine: Secondary | ICD-10-CM | POA: Diagnosis not present

## 2021-01-19 DIAGNOSIS — M791 Myalgia, unspecified site: Secondary | ICD-10-CM | POA: Diagnosis not present

## 2021-01-19 DIAGNOSIS — F320 Major depressive disorder, single episode, mild: Secondary | ICD-10-CM | POA: Diagnosis not present

## 2021-01-19 DIAGNOSIS — F902 Attention-deficit hyperactivity disorder, combined type: Secondary | ICD-10-CM | POA: Diagnosis not present

## 2021-01-19 DIAGNOSIS — M542 Cervicalgia: Secondary | ICD-10-CM | POA: Diagnosis not present

## 2021-01-19 DIAGNOSIS — Z0000 Encounter for general adult medical examination without abnormal findings: Secondary | ICD-10-CM | POA: Diagnosis not present

## 2021-01-19 DIAGNOSIS — R030 Elevated blood-pressure reading, without diagnosis of hypertension: Secondary | ICD-10-CM | POA: Diagnosis not present

## 2021-01-19 DIAGNOSIS — E785 Hyperlipidemia, unspecified: Secondary | ICD-10-CM | POA: Diagnosis not present

## 2021-01-19 DIAGNOSIS — G43719 Chronic migraine without aura, intractable, without status migrainosus: Secondary | ICD-10-CM | POA: Diagnosis not present

## 2021-01-19 DIAGNOSIS — G518 Other disorders of facial nerve: Secondary | ICD-10-CM | POA: Diagnosis not present

## 2021-01-19 DIAGNOSIS — Z1212 Encounter for screening for malignant neoplasm of rectum: Secondary | ICD-10-CM | POA: Diagnosis not present

## 2021-01-26 DIAGNOSIS — F320 Major depressive disorder, single episode, mild: Secondary | ICD-10-CM | POA: Diagnosis not present

## 2021-01-26 DIAGNOSIS — F902 Attention-deficit hyperactivity disorder, combined type: Secondary | ICD-10-CM | POA: Diagnosis not present

## 2021-02-09 DIAGNOSIS — M542 Cervicalgia: Secondary | ICD-10-CM | POA: Diagnosis not present

## 2021-02-09 DIAGNOSIS — G518 Other disorders of facial nerve: Secondary | ICD-10-CM | POA: Diagnosis not present

## 2021-02-09 DIAGNOSIS — F902 Attention-deficit hyperactivity disorder, combined type: Secondary | ICD-10-CM | POA: Diagnosis not present

## 2021-02-09 DIAGNOSIS — F320 Major depressive disorder, single episode, mild: Secondary | ICD-10-CM | POA: Diagnosis not present

## 2021-02-09 DIAGNOSIS — M791 Myalgia, unspecified site: Secondary | ICD-10-CM | POA: Diagnosis not present

## 2021-02-09 DIAGNOSIS — G43719 Chronic migraine without aura, intractable, without status migrainosus: Secondary | ICD-10-CM | POA: Diagnosis not present

## 2021-02-16 DIAGNOSIS — F902 Attention-deficit hyperactivity disorder, combined type: Secondary | ICD-10-CM | POA: Diagnosis not present

## 2021-02-16 DIAGNOSIS — F320 Major depressive disorder, single episode, mild: Secondary | ICD-10-CM | POA: Diagnosis not present

## 2021-02-23 DIAGNOSIS — M791 Myalgia, unspecified site: Secondary | ICD-10-CM | POA: Diagnosis not present

## 2021-02-23 DIAGNOSIS — G43719 Chronic migraine without aura, intractable, without status migrainosus: Secondary | ICD-10-CM | POA: Diagnosis not present

## 2021-02-23 DIAGNOSIS — M542 Cervicalgia: Secondary | ICD-10-CM | POA: Diagnosis not present

## 2021-02-23 DIAGNOSIS — G518 Other disorders of facial nerve: Secondary | ICD-10-CM | POA: Diagnosis not present

## 2021-03-02 DIAGNOSIS — F902 Attention-deficit hyperactivity disorder, combined type: Secondary | ICD-10-CM | POA: Diagnosis not present

## 2021-03-02 DIAGNOSIS — F320 Major depressive disorder, single episode, mild: Secondary | ICD-10-CM | POA: Diagnosis not present

## 2021-03-09 DIAGNOSIS — F902 Attention-deficit hyperactivity disorder, combined type: Secondary | ICD-10-CM | POA: Diagnosis not present

## 2021-03-09 DIAGNOSIS — F320 Major depressive disorder, single episode, mild: Secondary | ICD-10-CM | POA: Diagnosis not present

## 2021-03-16 DIAGNOSIS — F320 Major depressive disorder, single episode, mild: Secondary | ICD-10-CM | POA: Diagnosis not present

## 2021-03-16 DIAGNOSIS — F902 Attention-deficit hyperactivity disorder, combined type: Secondary | ICD-10-CM | POA: Diagnosis not present

## 2021-03-23 DIAGNOSIS — G518 Other disorders of facial nerve: Secondary | ICD-10-CM | POA: Diagnosis not present

## 2021-03-23 DIAGNOSIS — G43719 Chronic migraine without aura, intractable, without status migrainosus: Secondary | ICD-10-CM | POA: Diagnosis not present

## 2021-03-23 DIAGNOSIS — F902 Attention-deficit hyperactivity disorder, combined type: Secondary | ICD-10-CM | POA: Diagnosis not present

## 2021-03-23 DIAGNOSIS — M546 Pain in thoracic spine: Secondary | ICD-10-CM | POA: Diagnosis not present

## 2021-03-23 DIAGNOSIS — M545 Low back pain, unspecified: Secondary | ICD-10-CM | POA: Diagnosis not present

## 2021-03-23 DIAGNOSIS — M542 Cervicalgia: Secondary | ICD-10-CM | POA: Diagnosis not present

## 2021-03-23 DIAGNOSIS — F320 Major depressive disorder, single episode, mild: Secondary | ICD-10-CM | POA: Diagnosis not present

## 2021-03-23 DIAGNOSIS — M791 Myalgia, unspecified site: Secondary | ICD-10-CM | POA: Diagnosis not present

## 2021-03-30 DIAGNOSIS — M546 Pain in thoracic spine: Secondary | ICD-10-CM | POA: Diagnosis not present

## 2021-03-30 DIAGNOSIS — M791 Myalgia, unspecified site: Secondary | ICD-10-CM | POA: Diagnosis not present

## 2021-03-30 DIAGNOSIS — M542 Cervicalgia: Secondary | ICD-10-CM | POA: Diagnosis not present

## 2021-03-30 DIAGNOSIS — M545 Low back pain, unspecified: Secondary | ICD-10-CM | POA: Diagnosis not present

## 2021-04-06 DIAGNOSIS — M542 Cervicalgia: Secondary | ICD-10-CM | POA: Diagnosis not present

## 2021-04-06 DIAGNOSIS — F320 Major depressive disorder, single episode, mild: Secondary | ICD-10-CM | POA: Diagnosis not present

## 2021-04-06 DIAGNOSIS — M791 Myalgia, unspecified site: Secondary | ICD-10-CM | POA: Diagnosis not present

## 2021-04-06 DIAGNOSIS — F902 Attention-deficit hyperactivity disorder, combined type: Secondary | ICD-10-CM | POA: Diagnosis not present

## 2021-04-06 DIAGNOSIS — G518 Other disorders of facial nerve: Secondary | ICD-10-CM | POA: Diagnosis not present

## 2021-04-06 DIAGNOSIS — G43719 Chronic migraine without aura, intractable, without status migrainosus: Secondary | ICD-10-CM | POA: Diagnosis not present

## 2021-04-06 DIAGNOSIS — M546 Pain in thoracic spine: Secondary | ICD-10-CM | POA: Diagnosis not present

## 2021-04-06 DIAGNOSIS — M545 Low back pain, unspecified: Secondary | ICD-10-CM | POA: Diagnosis not present

## 2021-04-20 DIAGNOSIS — F902 Attention-deficit hyperactivity disorder, combined type: Secondary | ICD-10-CM | POA: Diagnosis not present

## 2021-04-20 DIAGNOSIS — F320 Major depressive disorder, single episode, mild: Secondary | ICD-10-CM | POA: Diagnosis not present

## 2021-04-20 DIAGNOSIS — M545 Low back pain, unspecified: Secondary | ICD-10-CM | POA: Diagnosis not present

## 2021-04-20 DIAGNOSIS — M791 Myalgia, unspecified site: Secondary | ICD-10-CM | POA: Diagnosis not present

## 2021-04-20 DIAGNOSIS — M5126 Other intervertebral disc displacement, lumbar region: Secondary | ICD-10-CM | POA: Diagnosis not present

## 2021-04-20 DIAGNOSIS — M25561 Pain in right knee: Secondary | ICD-10-CM | POA: Diagnosis not present

## 2021-04-20 DIAGNOSIS — M542 Cervicalgia: Secondary | ICD-10-CM | POA: Diagnosis not present

## 2021-04-20 DIAGNOSIS — M546 Pain in thoracic spine: Secondary | ICD-10-CM | POA: Diagnosis not present

## 2021-04-27 DIAGNOSIS — M545 Low back pain, unspecified: Secondary | ICD-10-CM | POA: Diagnosis not present

## 2021-04-27 DIAGNOSIS — M791 Myalgia, unspecified site: Secondary | ICD-10-CM | POA: Diagnosis not present

## 2021-04-27 DIAGNOSIS — M546 Pain in thoracic spine: Secondary | ICD-10-CM | POA: Diagnosis not present

## 2021-04-27 DIAGNOSIS — F320 Major depressive disorder, single episode, mild: Secondary | ICD-10-CM | POA: Diagnosis not present

## 2021-04-27 DIAGNOSIS — F902 Attention-deficit hyperactivity disorder, combined type: Secondary | ICD-10-CM | POA: Diagnosis not present

## 2021-04-27 DIAGNOSIS — M542 Cervicalgia: Secondary | ICD-10-CM | POA: Diagnosis not present

## 2021-05-04 DIAGNOSIS — M542 Cervicalgia: Secondary | ICD-10-CM | POA: Diagnosis not present

## 2021-05-04 DIAGNOSIS — F320 Major depressive disorder, single episode, mild: Secondary | ICD-10-CM | POA: Diagnosis not present

## 2021-05-04 DIAGNOSIS — G43719 Chronic migraine without aura, intractable, without status migrainosus: Secondary | ICD-10-CM | POA: Diagnosis not present

## 2021-05-04 DIAGNOSIS — M791 Myalgia, unspecified site: Secondary | ICD-10-CM | POA: Diagnosis not present

## 2021-05-04 DIAGNOSIS — M546 Pain in thoracic spine: Secondary | ICD-10-CM | POA: Diagnosis not present

## 2021-05-04 DIAGNOSIS — F902 Attention-deficit hyperactivity disorder, combined type: Secondary | ICD-10-CM | POA: Diagnosis not present

## 2021-05-04 DIAGNOSIS — M545 Low back pain, unspecified: Secondary | ICD-10-CM | POA: Diagnosis not present

## 2021-05-04 DIAGNOSIS — G518 Other disorders of facial nerve: Secondary | ICD-10-CM | POA: Diagnosis not present

## 2021-05-11 DIAGNOSIS — M542 Cervicalgia: Secondary | ICD-10-CM | POA: Diagnosis not present

## 2021-05-11 DIAGNOSIS — M546 Pain in thoracic spine: Secondary | ICD-10-CM | POA: Diagnosis not present

## 2021-05-11 DIAGNOSIS — M791 Myalgia, unspecified site: Secondary | ICD-10-CM | POA: Diagnosis not present

## 2021-05-11 DIAGNOSIS — F902 Attention-deficit hyperactivity disorder, combined type: Secondary | ICD-10-CM | POA: Diagnosis not present

## 2021-05-11 DIAGNOSIS — F320 Major depressive disorder, single episode, mild: Secondary | ICD-10-CM | POA: Diagnosis not present

## 2021-05-11 DIAGNOSIS — M545 Low back pain, unspecified: Secondary | ICD-10-CM | POA: Diagnosis not present

## 2021-05-18 DIAGNOSIS — M791 Myalgia, unspecified site: Secondary | ICD-10-CM | POA: Diagnosis not present

## 2021-05-18 DIAGNOSIS — F902 Attention-deficit hyperactivity disorder, combined type: Secondary | ICD-10-CM | POA: Diagnosis not present

## 2021-05-18 DIAGNOSIS — M542 Cervicalgia: Secondary | ICD-10-CM | POA: Diagnosis not present

## 2021-05-18 DIAGNOSIS — F320 Major depressive disorder, single episode, mild: Secondary | ICD-10-CM | POA: Diagnosis not present

## 2021-05-18 DIAGNOSIS — M546 Pain in thoracic spine: Secondary | ICD-10-CM | POA: Diagnosis not present

## 2021-05-18 DIAGNOSIS — M545 Low back pain, unspecified: Secondary | ICD-10-CM | POA: Diagnosis not present

## 2021-05-19 ENCOUNTER — Ambulatory Visit (INDEPENDENT_AMBULATORY_CARE_PROVIDER_SITE_OTHER): Payer: Self-pay | Admitting: Plastic Surgery

## 2021-05-19 ENCOUNTER — Other Ambulatory Visit: Payer: Self-pay

## 2021-05-19 DIAGNOSIS — Z411 Encounter for cosmetic surgery: Secondary | ICD-10-CM

## 2021-05-19 NOTE — Progress Notes (Signed)
Patient is here to discuss Botox treatment.  Last time I gave her 36 units of Botox in the forehead, glabella and crows feet and she was happy with the outcome from this.  She does get chronic migraines and wants to sort out whether or not insurance would be able to cover her Botox treatment as the current injections she has been getting from a neurologist have not been very helpful.  She did find the last round of injections that I gave her to be helpful.  The forehead, the belly and crows feet were prepped with an alcohol pad and 36 units of Botox were distributed in those areas as was done last time.  I explained I would look into whether or not insurance might cover future Botox treatments.  All her questions were answered.

## 2021-05-25 DIAGNOSIS — R140 Abdominal distension (gaseous): Secondary | ICD-10-CM | POA: Diagnosis not present

## 2021-05-25 DIAGNOSIS — F320 Major depressive disorder, single episode, mild: Secondary | ICD-10-CM | POA: Diagnosis not present

## 2021-05-25 DIAGNOSIS — R112 Nausea with vomiting, unspecified: Secondary | ICD-10-CM | POA: Diagnosis not present

## 2021-05-25 DIAGNOSIS — F902 Attention-deficit hyperactivity disorder, combined type: Secondary | ICD-10-CM | POA: Diagnosis not present

## 2021-05-25 DIAGNOSIS — R109 Unspecified abdominal pain: Secondary | ICD-10-CM | POA: Diagnosis not present

## 2021-05-25 DIAGNOSIS — K5904 Chronic idiopathic constipation: Secondary | ICD-10-CM | POA: Diagnosis not present

## 2021-06-01 DIAGNOSIS — M791 Myalgia, unspecified site: Secondary | ICD-10-CM | POA: Diagnosis not present

## 2021-06-01 DIAGNOSIS — F320 Major depressive disorder, single episode, mild: Secondary | ICD-10-CM | POA: Diagnosis not present

## 2021-06-01 DIAGNOSIS — F3341 Major depressive disorder, recurrent, in partial remission: Secondary | ICD-10-CM | POA: Diagnosis not present

## 2021-06-01 DIAGNOSIS — G43719 Chronic migraine without aura, intractable, without status migrainosus: Secondary | ICD-10-CM | POA: Diagnosis not present

## 2021-06-01 DIAGNOSIS — F410 Panic disorder [episodic paroxysmal anxiety] without agoraphobia: Secondary | ICD-10-CM | POA: Diagnosis not present

## 2021-06-01 DIAGNOSIS — M545 Low back pain, unspecified: Secondary | ICD-10-CM | POA: Diagnosis not present

## 2021-06-01 DIAGNOSIS — G518 Other disorders of facial nerve: Secondary | ICD-10-CM | POA: Diagnosis not present

## 2021-06-01 DIAGNOSIS — F902 Attention-deficit hyperactivity disorder, combined type: Secondary | ICD-10-CM | POA: Diagnosis not present

## 2021-06-01 DIAGNOSIS — M542 Cervicalgia: Secondary | ICD-10-CM | POA: Diagnosis not present

## 2021-06-01 DIAGNOSIS — M546 Pain in thoracic spine: Secondary | ICD-10-CM | POA: Diagnosis not present

## 2021-06-01 DIAGNOSIS — G4700 Insomnia, unspecified: Secondary | ICD-10-CM | POA: Diagnosis not present

## 2021-06-01 DIAGNOSIS — F341 Dysthymic disorder: Secondary | ICD-10-CM | POA: Diagnosis not present

## 2021-06-08 DIAGNOSIS — F902 Attention-deficit hyperactivity disorder, combined type: Secondary | ICD-10-CM | POA: Diagnosis not present

## 2021-06-08 DIAGNOSIS — F320 Major depressive disorder, single episode, mild: Secondary | ICD-10-CM | POA: Diagnosis not present

## 2021-06-15 DIAGNOSIS — F902 Attention-deficit hyperactivity disorder, combined type: Secondary | ICD-10-CM | POA: Diagnosis not present

## 2021-06-15 DIAGNOSIS — M546 Pain in thoracic spine: Secondary | ICD-10-CM | POA: Diagnosis not present

## 2021-06-15 DIAGNOSIS — M545 Low back pain, unspecified: Secondary | ICD-10-CM | POA: Diagnosis not present

## 2021-06-15 DIAGNOSIS — F320 Major depressive disorder, single episode, mild: Secondary | ICD-10-CM | POA: Diagnosis not present

## 2021-06-15 DIAGNOSIS — M542 Cervicalgia: Secondary | ICD-10-CM | POA: Diagnosis not present

## 2021-06-15 DIAGNOSIS — M791 Myalgia, unspecified site: Secondary | ICD-10-CM | POA: Diagnosis not present

## 2021-06-22 DIAGNOSIS — M545 Low back pain, unspecified: Secondary | ICD-10-CM | POA: Diagnosis not present

## 2021-06-22 DIAGNOSIS — F902 Attention-deficit hyperactivity disorder, combined type: Secondary | ICD-10-CM | POA: Diagnosis not present

## 2021-06-22 DIAGNOSIS — F320 Major depressive disorder, single episode, mild: Secondary | ICD-10-CM | POA: Diagnosis not present

## 2021-06-22 DIAGNOSIS — M542 Cervicalgia: Secondary | ICD-10-CM | POA: Diagnosis not present

## 2021-06-22 DIAGNOSIS — M546 Pain in thoracic spine: Secondary | ICD-10-CM | POA: Diagnosis not present

## 2021-06-22 DIAGNOSIS — M791 Myalgia, unspecified site: Secondary | ICD-10-CM | POA: Diagnosis not present

## 2021-06-23 DIAGNOSIS — G43719 Chronic migraine without aura, intractable, without status migrainosus: Secondary | ICD-10-CM | POA: Diagnosis not present

## 2021-06-23 DIAGNOSIS — M791 Myalgia, unspecified site: Secondary | ICD-10-CM | POA: Diagnosis not present

## 2021-06-23 DIAGNOSIS — G518 Other disorders of facial nerve: Secondary | ICD-10-CM | POA: Diagnosis not present

## 2021-06-23 DIAGNOSIS — M542 Cervicalgia: Secondary | ICD-10-CM | POA: Diagnosis not present

## 2021-06-29 DIAGNOSIS — F320 Major depressive disorder, single episode, mild: Secondary | ICD-10-CM | POA: Diagnosis not present

## 2021-06-29 DIAGNOSIS — M542 Cervicalgia: Secondary | ICD-10-CM | POA: Diagnosis not present

## 2021-06-29 DIAGNOSIS — M545 Low back pain, unspecified: Secondary | ICD-10-CM | POA: Diagnosis not present

## 2021-06-29 DIAGNOSIS — M546 Pain in thoracic spine: Secondary | ICD-10-CM | POA: Diagnosis not present

## 2021-06-29 DIAGNOSIS — M791 Myalgia, unspecified site: Secondary | ICD-10-CM | POA: Diagnosis not present

## 2021-06-29 DIAGNOSIS — F902 Attention-deficit hyperactivity disorder, combined type: Secondary | ICD-10-CM | POA: Diagnosis not present

## 2021-07-05 ENCOUNTER — Emergency Department (HOSPITAL_BASED_OUTPATIENT_CLINIC_OR_DEPARTMENT_OTHER)
Admission: EM | Admit: 2021-07-05 | Discharge: 2021-07-05 | Disposition: A | Discharge status: Home / Self Care | Payer: BC Managed Care – PPO | Attending: Emergency Medicine | Admitting: Emergency Medicine

## 2021-07-05 ENCOUNTER — Other Ambulatory Visit: Payer: Self-pay

## 2021-07-05 ENCOUNTER — Encounter (HOSPITAL_BASED_OUTPATIENT_CLINIC_OR_DEPARTMENT_OTHER): Payer: Self-pay | Admitting: Emergency Medicine

## 2021-07-05 ENCOUNTER — Emergency Department (HOSPITAL_BASED_OUTPATIENT_CLINIC_OR_DEPARTMENT_OTHER): Payer: BC Managed Care – PPO

## 2021-07-05 DIAGNOSIS — N200 Calculus of kidney: Secondary | ICD-10-CM | POA: Diagnosis not present

## 2021-07-05 DIAGNOSIS — N3289 Other specified disorders of bladder: Secondary | ICD-10-CM | POA: Diagnosis not present

## 2021-07-05 DIAGNOSIS — N1330 Unspecified hydronephrosis: Secondary | ICD-10-CM | POA: Diagnosis not present

## 2021-07-05 DIAGNOSIS — R109 Unspecified abdominal pain: Secondary | ICD-10-CM | POA: Diagnosis not present

## 2021-07-05 DIAGNOSIS — Z8616 Personal history of COVID-19: Secondary | ICD-10-CM | POA: Diagnosis not present

## 2021-07-05 DIAGNOSIS — N202 Calculus of kidney with calculus of ureter: Secondary | ICD-10-CM | POA: Diagnosis not present

## 2021-07-05 DIAGNOSIS — K5900 Constipation, unspecified: Secondary | ICD-10-CM | POA: Insufficient documentation

## 2021-07-05 LAB — CBC
HCT: 44 % (ref 36.0–46.0)
Hemoglobin: 14.8 g/dL (ref 12.0–15.0)
MCH: 30.1 pg (ref 26.0–34.0)
MCHC: 33.6 g/dL (ref 30.0–36.0)
MCV: 89.4 fL (ref 80.0–100.0)
Platelets: 315 10*3/uL (ref 150–400)
RBC: 4.92 MIL/uL (ref 3.87–5.11)
RDW: 12.8 % (ref 11.5–15.5)
WBC: 10.1 10*3/uL (ref 4.0–10.5)
nRBC: 0 % (ref 0.0–0.2)

## 2021-07-05 LAB — URINALYSIS, MICROSCOPIC (REFLEX)

## 2021-07-05 LAB — COMPREHENSIVE METABOLIC PANEL
ALT: 17 U/L (ref 0–44)
AST: 16 U/L (ref 15–41)
Albumin: 4 g/dL (ref 3.5–5.0)
Alkaline Phosphatase: 114 U/L (ref 38–126)
Anion gap: 9 (ref 5–15)
BUN: 13 mg/dL (ref 6–20)
CO2: 24 mmol/L (ref 22–32)
Calcium: 9.2 mg/dL (ref 8.9–10.3)
Chloride: 103 mmol/L (ref 98–111)
Creatinine, Ser: 1.15 mg/dL — ABNORMAL HIGH (ref 0.44–1.00)
GFR, Estimated: 57 mL/min — ABNORMAL LOW (ref >60)
Glucose, Bld: 104 mg/dL — ABNORMAL HIGH (ref 70–99)
Potassium: 3.9 mmol/L (ref 3.5–5.1)
Sodium: 136 mmol/L (ref 135–145)
Total Bilirubin: 0.4 mg/dL (ref 0.3–1.2)
Total Protein: 7.1 g/dL (ref 6.5–8.1)

## 2021-07-05 LAB — URINALYSIS, ROUTINE W REFLEX MICROSCOPIC
Bilirubin Urine: NEGATIVE
Glucose, UA: NEGATIVE mg/dL
Ketones, ur: NEGATIVE mg/dL
Leukocytes,Ua: NEGATIVE
Nitrite: NEGATIVE
Protein, ur: NEGATIVE mg/dL
Specific Gravity, Urine: 1.025 (ref 1.005–1.030)
pH: 6 (ref 5.0–8.0)

## 2021-07-05 LAB — LIPASE, BLOOD: Lipase: 31 U/L (ref 11–51)

## 2021-07-05 LAB — PREGNANCY, URINE: Preg Test, Ur: NEGATIVE

## 2021-07-05 MED ORDER — KETOROLAC TROMETHAMINE 30 MG/ML IJ SOLN
30.0000 mg | Freq: Once | INTRAMUSCULAR | Status: AC
Start: 2021-07-05 — End: 2021-07-05
  Administered 2021-07-05: 30 mg via INTRAMUSCULAR
  Filled 2021-07-05: qty 1

## 2021-07-05 MED ORDER — ONDANSETRON HCL 4 MG PO TABS: 4.0000 mg | ORAL_TABLET | Freq: Three times a day (TID) | ORAL | 0 refills | Status: DC | PRN

## 2021-07-05 MED ORDER — OXYCODONE-ACETAMINOPHEN 5-325 MG PO TABS: 1.0000 | ORAL_TABLET | Freq: Four times a day (QID) | ORAL | 0 refills | Status: AC | PRN

## 2021-07-05 MED ORDER — TAMSULOSIN HCL 0.4 MG PO CAPS: 0.4000 mg | ORAL_CAPSULE | Freq: Every day | ORAL | 0 refills | Status: DC

## 2021-07-05 NOTE — ED Notes (Signed)
Pt A&OX4 ambulatory at d/c with independent steady gait 

## 2021-07-05 NOTE — ED Triage Notes (Signed)
Abd pain on and off with constipation since  since July first and she has hemorrids and occ she will have diarrhea

## 2021-07-05 NOTE — Discharge Instructions (Addendum)
You were seen in the emergency department today for abdominal pain.  After lab work and a CT of your abdomen it appears that you have a 5 mm stone in your left ureter.  This is likely the cause for the pain that you have been having.  While you were here we gave you a dose of medication for pain.  I am also prescribing you medication called tamsulosin which helps to pass the stone.  I am giving you pain medication for the pain you may experience is the stone is passing.  You have been prescribed a medication that is considered an opiate. Opiates are pain medications that should be used with caution. It is important that you do not drive while taking this medication as it can cause drowsiness and impaired reaction times. Do not mix this medication with benzodiazepine medications or alcohol as this can cause respiratory depression. Additionally, opiates have addicting properties to them. Please use medication as prescribed by your provider.  Additionally I am giving you Zofran for nausea that you may experience while passing the stone.  I have placed the [facility] phone number and address and your discharge paperwork.  Please call them tomorrow to follow-up.  You may also return to emergency department for any reason.  Specifically return to the emergency department if you begin to have fever, or inability to pass urine.  Thank you for trusting [name] with your care.

## 2021-07-05 NOTE — ED Provider Notes (Signed)
[facility] Provider Note   CSN: [id number] Arrival date & time: 07/05/21  1144     History Chief Complaint  Patient presents with   Constipation   Abdominal Pain    [name] is a 50 y.o. female.  With past medical history of anxiety, nephrolithiasis who presents emergency department with abdominal pain.  States that she has had ongoing left-sided abdominal pain and difficulty with constipation for months.  She sees her primary care provider who has prescribed her different medications with this.  However she woke up this morning with different left-sided abdominal pain which prompted her presentation to the emergency department.  Endorses left-sided abdominal pain, mild left CVA tenderness.  She does state that at times she feels like she has urgency however when she goes to the bathroom she no longer has to urinate.  Denies fever, chills, urinary symptoms, hematuria, vaginal discharge, chest pain, shortness of breath, lower extremity swelling.   Constipation Associated symptoms: abdominal pain   Associated symptoms: no dysuria and no fever   Abdominal Pain Associated symptoms: constipation   Associated symptoms: no chills, no dysuria, no fever and no hematuria       Past Medical History:  Diagnosis Date   ADD (attention deficit disorder)    Anxiety    Arthropathy, unspecified, site unspecified    Chronic fatigue syndrome    Depression    Disc degeneration    Fibromyalgia    History of kidney stones    last stone in 2010   Migraines    Multilevel degenerative disc disease    cervical and lumbar, has had injections/epidural   Panic attack 2019   PTSD (post-traumatic stress disorder) 2019   Sleep disorder 01/08/2014   Sleep disturbance, unspecified    Symptomatic mammary hypertrophy 09/2018   Unspecified nonpsychotic mental disorder     Patient Active Problem List   Diagnosis Date Noted   History of COVID-19  01/21/2020   Chronic cough 01/21/2020   Migraine without status migrainosus, not intractable 01/21/2020   Tinnitus of both ears 01/21/2020   Neck pain 08/18/2018   Back pain 08/18/2018   Symptomatic mammary hypertrophy 08/18/2018   Obesity, Class II, BMI 35-39.9 09/29/2017   Depression 03/31/2017   Thyroid disease 03/31/2017   Sleep disorder 01/08/2014   Fibroids 10/16/2013    Past Surgical History:  Procedure Laterality Date   ABDOMINAL HYSTERECTOMY     BILATERAL SALPINGECTOMY Bilateral 10/16/2013   Procedure: BILATERAL SALPINGECTOMY;  Surgeon: Lovenia Kim, MD;  Location: [facility];  Service: Gynecology;  Laterality: Bilateral;   BREAST REDUCTION SURGERY Bilateral 10/03/2018   Procedure: BILATERAL BREAST REDUCTION;  Surgeon: Wallace Going, DO;  Location: [facility];  Service: Plastics;  Laterality: Bilateral;   CESAREAN SECTION     x2   CHOLECYSTECTOMY     DIAGNOSTIC LAPAROSCOPY     MANDIBLE SURGERY     REMOVAL OF URINARY SLING N/A 01/09/2018   Procedure: REMOVAL OF MESH URINARY SLING EXTRUSION  CYSTOSTOPY;  Surgeon: Bjorn Loser, MD;  Location: [facility];  Service: Urology;  Laterality: N/A;   ROBOTIC ASSISTED TOTAL HYSTERECTOMY N/A 10/16/2013   Procedure: ROBOTIC ASSISTED TOTAL HYSTERECTOMY;  Surgeon: Lovenia Kim, MD;  Location: [facility];  Service: Gynecology;  Laterality: N/A;     OB History   No obstetric history on file.     Family History  Problem Relation Age of Onset   Heart attack Mother    Hypertension  Mother    Depression Mother    Neuropathy Father    Diabetes Father    Cancer Paternal Grandfather    Stroke Maternal Grandmother    Alcohol abuse Maternal Grandfather    Breast cancer Neg Hx     Social History   Tobacco Use   Smoking status: Never   Smokeless tobacco: Never  Vaping Use   Vaping Use: Never used  Substance Use Topics   Alcohol use: Yes    Comment: socially, once per month   Drug use: Yes    Types:  Marijuana    Comment: prn when pain gets bad    Home Medications Prior to Admission medications   Medication Sig Start Date End Date Taking? Authorizing Provider  ALPRAZolam Duanne Moron) 1 MG tablet Take 1 mg by mouth 3 (three) times daily as needed. 03/05/21   [provider]  benzonatate (TESSALON) 100 MG capsule Take by mouth 2 (two) times daily as needed for cough.    [provider]  buPROPion (WELLBUTRIN XL) 150 MG 24 hr tablet Take 150 mg by mouth every morning. 04/21/21   [provider]  DULoxetine (CYMBALTA) 60 MG capsule Take 60 mg by mouth daily.     [provider]  SAXENDA 18 MG/3ML SOPN Inject 1.8 mg into the skin daily. 09/30/17   [provider]  VYVANSE 70 MG capsule Take 70 mg by mouth every morning. 12/27/19   [provider]  WEGOVY 0.5 MG/0.5ML SOAJ SMARTSIG:0.5 Milliliter(s) SUB-Q Once a Week 01/30/21   [provider]    Allergies    Mirabegron, Sulfa antibiotics, Elavil [amitriptyline hcl], and Lyrica [pregabalin]  Review of Systems   Review of Systems  Constitutional:  Negative for chills and fever.  Gastrointestinal:  Positive for abdominal pain and constipation.  Genitourinary:  Positive for urgency. Negative for dysuria and hematuria.  All other systems reviewed and are negative.  Physical Exam Updated Vital Signs BP (!) 126/101 (BP Location: Right Arm)   Pulse (!) 110   Temp 98.5 F (36.9 C) (Oral)   Resp 20   Ht '5\' 3"'$  (1.6 m)   Wt 71.7 kg   LMP 10/05/2013   SpO2 100%   BMI 27.99 kg/m   Physical Exam Vitals and nursing note reviewed.  Constitutional:      General: She is not in acute distress.    Appearance: She is well-developed. She is not toxic-appearing.  HENT:     Head: Normocephalic and atraumatic.     Mouth/Throat:     Mouth: Mucous membranes are moist.     Pharynx: Oropharynx is clear.  Eyes:     General: No scleral icterus. Cardiovascular:     Rate and Rhythm: Normal rate  and regular rhythm.     Heart sounds: Normal heart sounds. No murmur heard. Pulmonary:     Effort: Pulmonary effort is normal. No respiratory distress.     Breath sounds: Normal breath sounds.  Abdominal:     General: Abdomen is protuberant. Bowel sounds are normal. There is no distension.     Palpations: Abdomen is soft.     Tenderness: There is abdominal tenderness in the epigastric area and left upper quadrant. There is left CVA tenderness. There is no right CVA tenderness, guarding or rebound. Negative signs include Murphy's sign, Rovsing's sign and McBurney's sign.  Musculoskeletal:        General: Normal range of motion.  Skin:    General: Skin is warm and dry.  Capillary Refill: Capillary refill takes less than 2 seconds.     Findings: No rash.  Neurological:     General: No focal deficit present.     Mental Status: She is alert.  Psychiatric:        Mood and Affect: Mood normal.        Behavior: Behavior normal.        Thought Content: Thought content normal.        Judgment: Judgment normal.    ED Results / Procedures / Treatments   Labs (all labs ordered are listed, but only abnormal results are displayed) Labs Reviewed  COMPREHENSIVE METABOLIC PANEL - Abnormal; Notable for the following components:      Result Value   Glucose, Bld 104 (*)    Creatinine, Ser 1.15 (*)    GFR, Estimated 57 (*)    All other components within normal limits  URINALYSIS, ROUTINE W REFLEX MICROSCOPIC - Abnormal; Notable for the following components:   Hgb urine dipstick LARGE (*)    All other components within normal limits  URINALYSIS, MICROSCOPIC (REFLEX) - Abnormal; Notable for the following components:   Bacteria, UA FEW (*)    All other components within normal limits  LIPASE, BLOOD  CBC  PREGNANCY, URINE    EKG None  Radiology CT Renal Stone Study  Result Date: 07/05/2021 CLINICAL DATA:  Constipation and flank pain. EXAM: CT ABDOMEN AND PELVIS WITHOUT CONTRAST  TECHNIQUE: Multidetector CT imaging of the abdomen and pelvis was performed following the standard protocol without IV contrast. COMPARISON:  July 06, 2020 FINDINGS: Lower chest: No acute abnormality. Hepatobiliary: No focal liver abnormality is seen. Status post cholecystectomy. No biliary dilatation. Pancreas: Unremarkable. No pancreatic ductal dilatation or surrounding inflammatory changes. Spleen: Normal in size without focal abnormality. Adrenals/Urinary Tract: Adrenal glands are unremarkable. Kidneys are normal in size, without focal lesions. A 5 mm obstructing renal stone is seen within the distal left ureter, near the left UVJ, with mild to moderate severity left-sided hydronephrosis and hydroureter. 2 mm and 3 mm nonobstructing renal calculi are seen within the left kidney. The urinary bladder is contracted and subsequently limited in evaluation. Stomach/Bowel: Stomach is within normal limits. Appendix appears normal. No evidence of bowel wall thickening, distention, or inflammatory changes. Vascular/Lymphatic: No significant vascular findings are present. No enlarged abdominal or pelvic lymph nodes. Reproductive: Status post hysterectomy. No adnexal masses. Other: No abdominal wall hernia or abnormality. No abdominopelvic ascites. Musculoskeletal: No acute or significant osseous findings. IMPRESSION: 1. 5 mm obstructing renal stone within the distal left ureter. 2. Nonobstructing renal calculi within the left kidney. 3. Evidence of prior cholecystectomy and hysterectomy. Electronically Signed   By: Virgina Norfolk M.D.   On: 07/05/2021 18:22    Procedures Procedures   Medications Ordered in ED Medications  ketorolac (TORADOL) 30 MG/ML injection 30 mg (30 mg Intramuscular Given 07/05/21 1924)    ED Course  I have reviewed the triage vital signs and the nursing notes.  Pertinent labs & imaging results that were available during my care of the patient were reviewed by me and considered in  my medical decision making (see chart for details).    MDM Rules/Calculators/A&P 49 year old female who presents to the emergency department with history, HPI and physical exam as described above.  Her physical exam is most consistent with left flank pain.  This is likely secondary to renal colic. I have low suspicion based on her history and exam for atypical appendicitis, cholelithiasis, AAA, ovarian torsion,  diverticulitis or other emergent intra-abdominal pathology.  UA with large blood. CT renal stone study shows 5 mm renal stone within the distal left ureter with hydronephrosis and hydroureter.  She also has 2 other small stones within the left kidney. UA without evidence of infected stone. Given IM Toradol in the emergency department for pain relief.  I will prescribe her Flomax, Zofran, Percocet for ongoing symptom management as she passes the stone.  She declines wanting a strainer for her urine.  She already sees [facility], so we will have her follow-up with them this week.  She understands to return to the emergency department for fever, decreased urine output, inability to urinate, increasing pain.  I have answered all of her questions at this time.  Vital signs stable safe for discharge. Final Clinical Impression(s) / ED Diagnoses Final diagnoses:  Nephrolithiasis    Rx / DC Orders ED Discharge Orders          Ordered    tamsulosin (FLOMAX) 0.4 MG CAPS capsule  Daily        07/05/21 1915    ondansetron (ZOFRAN) 4 MG tablet  Every 8 hours PRN        07/05/21 1915    oxyCODONE-acetaminophen (PERCOCET/ROXICET) 5-325 MG tablet  Every 6 hours PRN        07/05/21 1915             Mickie Hillier, PA-C 07/06/21 0103    Hayden Rasmussen, MD 07/06/21 1924

## 2021-07-13 DIAGNOSIS — F320 Major depressive disorder, single episode, mild: Secondary | ICD-10-CM | POA: Diagnosis not present

## 2021-07-13 DIAGNOSIS — M545 Low back pain, unspecified: Secondary | ICD-10-CM | POA: Diagnosis not present

## 2021-07-13 DIAGNOSIS — M546 Pain in thoracic spine: Secondary | ICD-10-CM | POA: Diagnosis not present

## 2021-07-13 DIAGNOSIS — M5451 Vertebrogenic low back pain: Secondary | ICD-10-CM | POA: Diagnosis not present

## 2021-07-13 DIAGNOSIS — M542 Cervicalgia: Secondary | ICD-10-CM | POA: Diagnosis not present

## 2021-07-13 DIAGNOSIS — M797 Fibromyalgia: Secondary | ICD-10-CM | POA: Diagnosis not present

## 2021-07-13 DIAGNOSIS — F902 Attention-deficit hyperactivity disorder, combined type: Secondary | ICD-10-CM | POA: Diagnosis not present

## 2021-07-16 DIAGNOSIS — N132 Hydronephrosis with renal and ureteral calculous obstruction: Secondary | ICD-10-CM | POA: Diagnosis not present

## 2021-07-16 DIAGNOSIS — N201 Calculus of ureter: Secondary | ICD-10-CM | POA: Diagnosis not present

## 2021-07-20 DIAGNOSIS — F902 Attention-deficit hyperactivity disorder, combined type: Secondary | ICD-10-CM | POA: Diagnosis not present

## 2021-07-20 DIAGNOSIS — F320 Major depressive disorder, single episode, mild: Secondary | ICD-10-CM | POA: Diagnosis not present

## 2021-07-27 DIAGNOSIS — F320 Major depressive disorder, single episode, mild: Secondary | ICD-10-CM | POA: Diagnosis not present

## 2021-07-27 DIAGNOSIS — M546 Pain in thoracic spine: Secondary | ICD-10-CM | POA: Diagnosis not present

## 2021-07-27 DIAGNOSIS — M542 Cervicalgia: Secondary | ICD-10-CM | POA: Diagnosis not present

## 2021-07-27 DIAGNOSIS — M545 Low back pain, unspecified: Secondary | ICD-10-CM | POA: Diagnosis not present

## 2021-07-27 DIAGNOSIS — F902 Attention-deficit hyperactivity disorder, combined type: Secondary | ICD-10-CM | POA: Diagnosis not present

## 2021-07-27 DIAGNOSIS — M791 Myalgia, unspecified site: Secondary | ICD-10-CM | POA: Diagnosis not present

## 2021-08-03 DIAGNOSIS — Z23 Encounter for immunization: Secondary | ICD-10-CM | POA: Diagnosis not present

## 2021-08-03 DIAGNOSIS — R030 Elevated blood-pressure reading, without diagnosis of hypertension: Secondary | ICD-10-CM | POA: Diagnosis not present

## 2021-08-10 DIAGNOSIS — M791 Myalgia, unspecified site: Secondary | ICD-10-CM | POA: Diagnosis not present

## 2021-08-10 DIAGNOSIS — F331 Major depressive disorder, recurrent, moderate: Secondary | ICD-10-CM | POA: Diagnosis not present

## 2021-08-10 DIAGNOSIS — M546 Pain in thoracic spine: Secondary | ICD-10-CM | POA: Diagnosis not present

## 2021-08-10 DIAGNOSIS — G4700 Insomnia, unspecified: Secondary | ICD-10-CM | POA: Diagnosis not present

## 2021-08-10 DIAGNOSIS — M545 Low back pain, unspecified: Secondary | ICD-10-CM | POA: Diagnosis not present

## 2021-08-10 DIAGNOSIS — M542 Cervicalgia: Secondary | ICD-10-CM | POA: Diagnosis not present

## 2021-08-10 DIAGNOSIS — F900 Attention-deficit hyperactivity disorder, predominantly inattentive type: Secondary | ICD-10-CM | POA: Diagnosis not present

## 2021-08-11 DIAGNOSIS — L821 Other seborrheic keratosis: Secondary | ICD-10-CM | POA: Diagnosis not present

## 2021-08-11 DIAGNOSIS — L814 Other melanin hyperpigmentation: Secondary | ICD-10-CM | POA: Diagnosis not present

## 2021-08-11 DIAGNOSIS — D1801 Hemangioma of skin and subcutaneous tissue: Secondary | ICD-10-CM | POA: Diagnosis not present

## 2021-08-11 DIAGNOSIS — D225 Melanocytic nevi of trunk: Secondary | ICD-10-CM | POA: Diagnosis not present

## 2021-08-11 DIAGNOSIS — L570 Actinic keratosis: Secondary | ICD-10-CM | POA: Diagnosis not present

## 2021-08-17 DIAGNOSIS — M791 Myalgia, unspecified site: Secondary | ICD-10-CM | POA: Diagnosis not present

## 2021-08-17 DIAGNOSIS — M546 Pain in thoracic spine: Secondary | ICD-10-CM | POA: Diagnosis not present

## 2021-08-17 DIAGNOSIS — M545 Low back pain, unspecified: Secondary | ICD-10-CM | POA: Diagnosis not present

## 2021-08-17 DIAGNOSIS — M542 Cervicalgia: Secondary | ICD-10-CM | POA: Diagnosis not present

## 2021-08-24 DIAGNOSIS — M542 Cervicalgia: Secondary | ICD-10-CM | POA: Diagnosis not present

## 2021-08-24 DIAGNOSIS — M5451 Vertebrogenic low back pain: Secondary | ICD-10-CM | POA: Diagnosis not present

## 2021-08-24 DIAGNOSIS — M546 Pain in thoracic spine: Secondary | ICD-10-CM | POA: Diagnosis not present

## 2021-08-31 DIAGNOSIS — M791 Myalgia, unspecified site: Secondary | ICD-10-CM | POA: Diagnosis not present

## 2021-08-31 DIAGNOSIS — M546 Pain in thoracic spine: Secondary | ICD-10-CM | POA: Diagnosis not present

## 2021-08-31 DIAGNOSIS — M545 Low back pain, unspecified: Secondary | ICD-10-CM | POA: Diagnosis not present

## 2021-08-31 DIAGNOSIS — M542 Cervicalgia: Secondary | ICD-10-CM | POA: Diagnosis not present

## 2021-09-07 DIAGNOSIS — M545 Low back pain, unspecified: Secondary | ICD-10-CM | POA: Diagnosis not present

## 2021-09-07 DIAGNOSIS — M546 Pain in thoracic spine: Secondary | ICD-10-CM | POA: Diagnosis not present

## 2021-09-07 DIAGNOSIS — M791 Myalgia, unspecified site: Secondary | ICD-10-CM | POA: Diagnosis not present

## 2021-09-07 DIAGNOSIS — M542 Cervicalgia: Secondary | ICD-10-CM | POA: Diagnosis not present

## 2021-09-07 DIAGNOSIS — Z1231 Encounter for screening mammogram for malignant neoplasm of breast: Secondary | ICD-10-CM | POA: Diagnosis not present

## 2021-09-08 DIAGNOSIS — F411 Generalized anxiety disorder: Secondary | ICD-10-CM | POA: Diagnosis not present

## 2021-09-08 DIAGNOSIS — F331 Major depressive disorder, recurrent, moderate: Secondary | ICD-10-CM | POA: Diagnosis not present

## 2021-09-13 DIAGNOSIS — M546 Pain in thoracic spine: Secondary | ICD-10-CM | POA: Diagnosis not present

## 2021-09-14 DIAGNOSIS — F411 Generalized anxiety disorder: Secondary | ICD-10-CM | POA: Diagnosis not present

## 2021-09-14 DIAGNOSIS — F331 Major depressive disorder, recurrent, moderate: Secondary | ICD-10-CM | POA: Diagnosis not present

## 2021-09-14 DIAGNOSIS — M791 Myalgia, unspecified site: Secondary | ICD-10-CM | POA: Diagnosis not present

## 2021-09-14 DIAGNOSIS — M546 Pain in thoracic spine: Secondary | ICD-10-CM | POA: Diagnosis not present

## 2021-09-14 DIAGNOSIS — M545 Low back pain, unspecified: Secondary | ICD-10-CM | POA: Diagnosis not present

## 2021-09-14 DIAGNOSIS — M542 Cervicalgia: Secondary | ICD-10-CM | POA: Diagnosis not present

## 2021-09-15 DIAGNOSIS — F331 Major depressive disorder, recurrent, moderate: Secondary | ICD-10-CM | POA: Diagnosis not present

## 2021-09-20 DIAGNOSIS — Z6827 Body mass index (BMI) 27.0-27.9, adult: Secondary | ICD-10-CM | POA: Diagnosis not present

## 2021-09-20 DIAGNOSIS — R8290 Unspecified abnormal findings in urine: Secondary | ICD-10-CM | POA: Diagnosis not present

## 2021-09-20 DIAGNOSIS — Z01419 Encounter for gynecological examination (general) (routine) without abnormal findings: Secondary | ICD-10-CM | POA: Diagnosis not present

## 2021-09-20 DIAGNOSIS — Z124 Encounter for screening for malignant neoplasm of cervix: Secondary | ICD-10-CM | POA: Diagnosis not present

## 2021-09-20 DIAGNOSIS — Z113 Encounter for screening for infections with a predominantly sexual mode of transmission: Secondary | ICD-10-CM | POA: Diagnosis not present

## 2021-09-21 DIAGNOSIS — F411 Generalized anxiety disorder: Secondary | ICD-10-CM | POA: Diagnosis not present

## 2021-09-21 DIAGNOSIS — F331 Major depressive disorder, recurrent, moderate: Secondary | ICD-10-CM | POA: Diagnosis not present

## 2021-09-22 DIAGNOSIS — M5451 Vertebrogenic low back pain: Secondary | ICD-10-CM | POA: Diagnosis not present

## 2021-09-22 DIAGNOSIS — M542 Cervicalgia: Secondary | ICD-10-CM | POA: Diagnosis not present

## 2021-09-22 DIAGNOSIS — M546 Pain in thoracic spine: Secondary | ICD-10-CM | POA: Diagnosis not present

## 2021-09-28 DIAGNOSIS — F331 Major depressive disorder, recurrent, moderate: Secondary | ICD-10-CM | POA: Diagnosis not present

## 2021-09-28 DIAGNOSIS — F411 Generalized anxiety disorder: Secondary | ICD-10-CM | POA: Diagnosis not present

## 2021-10-05 DIAGNOSIS — F411 Generalized anxiety disorder: Secondary | ICD-10-CM | POA: Diagnosis not present

## 2021-10-05 DIAGNOSIS — F331 Major depressive disorder, recurrent, moderate: Secondary | ICD-10-CM | POA: Diagnosis not present

## 2021-10-07 DIAGNOSIS — M542 Cervicalgia: Secondary | ICD-10-CM | POA: Diagnosis not present

## 2021-10-07 DIAGNOSIS — G43719 Chronic migraine without aura, intractable, without status migrainosus: Secondary | ICD-10-CM | POA: Diagnosis not present

## 2021-10-07 DIAGNOSIS — M791 Myalgia, unspecified site: Secondary | ICD-10-CM | POA: Diagnosis not present

## 2021-10-07 DIAGNOSIS — G518 Other disorders of facial nerve: Secondary | ICD-10-CM | POA: Diagnosis not present

## 2021-10-12 DIAGNOSIS — F331 Major depressive disorder, recurrent, moderate: Secondary | ICD-10-CM | POA: Diagnosis not present

## 2021-10-12 DIAGNOSIS — M542 Cervicalgia: Secondary | ICD-10-CM | POA: Diagnosis not present

## 2021-10-12 DIAGNOSIS — M791 Myalgia, unspecified site: Secondary | ICD-10-CM | POA: Diagnosis not present

## 2021-10-12 DIAGNOSIS — M546 Pain in thoracic spine: Secondary | ICD-10-CM | POA: Diagnosis not present

## 2021-10-12 DIAGNOSIS — M545 Low back pain, unspecified: Secondary | ICD-10-CM | POA: Diagnosis not present

## 2021-10-12 DIAGNOSIS — F411 Generalized anxiety disorder: Secondary | ICD-10-CM | POA: Diagnosis not present

## 2021-12-20 ENCOUNTER — Encounter: Payer: Self-pay | Admitting: Emergency Medicine

## 2021-12-20 ENCOUNTER — Ambulatory Visit
Admission: EM | Admit: 2021-12-20 | Discharge: 2021-12-20 | Disposition: A | Discharge status: Home / Self Care | Payer: 59 | Attending: Internal Medicine | Admitting: Internal Medicine

## 2021-12-20 ENCOUNTER — Other Ambulatory Visit: Payer: Self-pay

## 2021-12-20 DIAGNOSIS — J069 Acute upper respiratory infection, unspecified: Secondary | ICD-10-CM

## 2021-12-20 MED ORDER — GUAIFENESIN 200 MG PO TABS: 200.0000 mg | ORAL_TABLET | ORAL | 0 refills | Status: DC | PRN

## 2021-12-20 MED ORDER — PREDNISONE 10 MG (21) PO TBPK: ORAL_TABLET | Freq: Every day | ORAL | 0 refills | Status: DC

## 2021-12-20 NOTE — ED Provider Notes (Signed)
[facility]    CSN: [id number] Arrival date & time: 12/20/21  1400      History   Chief Complaint Chief Complaint  Patient presents with   Cough    HPI [name] is a 51 y.o. female.   Patient presents with cough and nasal congestion that has been present for approximately 1 week.  Cough is productive per patient.  Denies any known fevers or sick contacts.  Denies chest pain, shortness of breath, sore throat, ear pain, nausea, vomiting, diarrhea, abdominal pain.  Patient has taken over-the-counter cold and flu medications as well as Tessalon Perles with minimal improvement in symptoms.  Denies history of asthma or COPD and patient is not a smoker.   Cough  Past Medical History:  Diagnosis Date   ADD (attention deficit disorder)    Anxiety    Arthropathy, unspecified, site unspecified    Chronic fatigue syndrome    Depression    Disc degeneration    Fibromyalgia    History of kidney stones    last stone in 2010   Migraines    Multilevel degenerative disc disease    cervical and lumbar, has had injections/epidural   Panic attack 2019   PTSD (post-traumatic stress disorder) 2019   Sleep disorder 01/08/2014   Sleep disturbance, unspecified    Symptomatic mammary hypertrophy 09/2018   Unspecified nonpsychotic mental disorder     Patient Active Problem List   Diagnosis Date Noted   History of COVID-19 01/21/2020   Chronic cough 01/21/2020   Migraine without status migrainosus, not intractable 01/21/2020   Tinnitus of both ears 01/21/2020   Neck pain 08/18/2018   Back pain 08/18/2018   Symptomatic mammary hypertrophy 08/18/2018   Obesity, Class II, BMI 35-39.9 09/29/2017   Depression 03/31/2017   Thyroid disease 03/31/2017   Sleep disorder 01/08/2014   Fibroids 10/16/2013    Past Surgical History:  Procedure Laterality Date   ABDOMINAL HYSTERECTOMY     BILATERAL SALPINGECTOMY Bilateral 10/16/2013   Procedure: BILATERAL  SALPINGECTOMY;  Surgeon: Lovenia Kim, MD;  Location: [facility];  Service: Gynecology;  Laterality: Bilateral;   BREAST REDUCTION SURGERY Bilateral 10/03/2018   Procedure: BILATERAL BREAST REDUCTION;  Surgeon: Wallace Going, DO;  Location: [facility];  Service: Plastics;  Laterality: Bilateral;   CESAREAN SECTION     x2   CHOLECYSTECTOMY     DIAGNOSTIC LAPAROSCOPY     MANDIBLE SURGERY     REMOVAL OF URINARY SLING N/A 01/09/2018   Procedure: REMOVAL OF MESH URINARY SLING EXTRUSION  CYSTOSTOPY;  Surgeon: Bjorn Loser, MD;  Location: [facility];  Service: Urology;  Laterality: N/A;   ROBOTIC ASSISTED TOTAL HYSTERECTOMY N/A 10/16/2013   Procedure: ROBOTIC ASSISTED TOTAL HYSTERECTOMY;  Surgeon: Lovenia Kim, MD;  Location: [facility];  Service: Gynecology;  Laterality: N/A;    OB History   No obstetric history on file.      Home Medications    Prior to Admission medications   Medication Sig Start Date End Date Taking? Authorizing Provider  guaiFENesin 200 MG tablet Take 1 tablet (200 mg total) by mouth every 4 (four) hours as needed for cough or to loosen phlegm. 12/20/21  Yes , Hildred Alamin E, FNP  predniSONE (STERAPRED UNI-PAK 21 TAB) 10 MG (21) TBPK tablet Take by mouth daily. Take 6 tabs by mouth daily  for 2 days, then 5 tabs for 2 days, then 4 tabs for 2 days, then 3 tabs for 2 days,  2 tabs for 2 days, then 1 tab by mouth daily for 2 days 12/20/21  Yes , [place] E, FNP  ALPRAZolam Duanne Moron) 1 MG tablet Take 1 mg by mouth 3 (three) times daily as needed. 03/05/21   [provider]  benzonatate (TESSALON) 100 MG capsule Take by mouth 2 (two) times daily as needed for cough.    [provider]  buPROPion (WELLBUTRIN XL) 150 MG 24 hr tablet Take 150 mg by mouth every morning. 04/21/21   [provider]  DULoxetine (CYMBALTA) 60 MG capsule Take 60 mg by mouth daily.     [provider]  ondansetron (ZOFRAN) 4 MG tablet Take 1 tablet (4 mg  total) by mouth every 8 (eight) hours as needed for nausea or vomiting. 07/05/21   Mickie Hillier, PA-C  SAXENDA 18 MG/3ML SOPN Inject 1.8 mg into the skin daily. 09/30/17   [provider]  tamsulosin (FLOMAX) 0.4 MG CAPS capsule Take 1 capsule (0.4 mg total) by mouth daily. 07/05/21   Mickie Hillier, PA-C  VYVANSE 70 MG capsule Take 70 mg by mouth every morning. 12/27/19   [provider]  WEGOVY 0.5 MG/0.5ML SOAJ SMARTSIG:0.5 Milliliter(s) SUB-Q Once a Week 01/30/21   [provider]    Family History Family History  Problem Relation Age of Onset   Heart attack Mother    Hypertension Mother    Depression Mother    Neuropathy Father    Diabetes Father    Cancer Paternal Grandfather    Stroke Maternal Grandmother    Alcohol abuse Maternal Grandfather    Breast cancer Neg Hx     Social History Social History   Tobacco Use   Smoking status: Never   Smokeless tobacco: Never  Vaping Use   Vaping Use: Never used  Substance Use Topics   Alcohol use: Yes    Comment: socially, once per month   Drug use: Yes    Types: Marijuana    Comment: prn when pain gets bad     Allergies   Mirabegron, Sulfa antibiotics, Elavil [amitriptyline hcl], and Lyrica [pregabalin]   Review of Systems Review of Systems Per HPI  Physical Exam Triage Vital Signs ED Triage Vitals  Enc Vitals Group     BP 12/20/21 1426 123/84     Pulse Rate 12/20/21 1426 89     Resp 12/20/21 1426 20     Temp 12/20/21 1426 97.9 F (36.6 C)     Temp Source 12/20/21 1426 Oral     SpO2 12/20/21 1426 97 %     Weight --      Height --      Head Circumference --      Peak Flow --      Pain Score 12/20/21 1431 3     Pain Loc --      Pain Edu? --      Excl. in [place]? --    No data found.  Updated Vital Signs BP 123/84 (BP Location: Left Arm)    Pulse 89    Temp 97.9 F (36.6 C) (Oral)    Resp 20    LMP 10/05/2013    SpO2 97%   Visual Acuity Right Eye Distance:   Left Eye Distance:    Bilateral Distance:    Right Eye Near:   Left Eye Near:    Bilateral Near:     Physical Exam Constitutional:      General: She is not in acute distress.  Appearance: Normal appearance. She is not toxic-appearing or diaphoretic.  HENT:     Head: Normocephalic and atraumatic.     Right Ear: Tympanic membrane and ear canal normal.     Left Ear: Tympanic membrane and ear canal normal.     Nose: Congestion present.     Mouth/Throat:     Mouth: Mucous membranes are moist.     Pharynx: No posterior oropharyngeal erythema.  Eyes:     Extraocular Movements: Extraocular movements intact.     Conjunctiva/sclera: Conjunctivae normal.     Pupils: Pupils are equal, round, and reactive to light.  Cardiovascular:     Rate and Rhythm: Normal rate and regular rhythm.     Pulses: Normal pulses.     Heart sounds: Normal heart sounds.  Pulmonary:     Effort: Pulmonary effort is normal. No respiratory distress.     Breath sounds: Normal breath sounds. No stridor. No wheezing, rhonchi or rales.     Comments: Harsh cough Abdominal:     General: Abdomen is flat. Bowel sounds are normal.     Palpations: Abdomen is soft.  Musculoskeletal:        General: Normal range of motion.     Cervical back: Normal range of motion.  Skin:    General: Skin is warm and dry.  Neurological:     General: No focal deficit present.     Mental Status: She is alert and oriented to person, place, and time. Mental status is at baseline.  Psychiatric:        Mood and Affect: Mood normal.        Behavior: Behavior normal.     UC Treatments / Results  Labs (all labs ordered are listed, but only abnormal results are displayed) Labs Reviewed  NOVEL CORONAVIRUS, NAA    EKG   Radiology No results found.  Procedures Procedures (including critical care time)  Medications Ordered in UC Medications - No data to display  Initial Impression / Assessment and Plan / UC Course  I have reviewed the triage vital  signs and the nursing notes.  Pertinent labs & imaging results that were available during my care of the patient were reviewed by me and considered in my medical decision making (see chart for details).     Patient presents with symptoms likely from a viral upper respiratory infection. Differential includes bacterial pneumonia, sinusitis, allergic rhinitis, COVID-19, flu. Do not suspect underlying cardiopulmonary process. Symptoms seem unlikely related to ACS, CHF or COPD exacerbations, pneumonia, pneumothorax. Patient is nontoxic appearing and not in need of emergent medical intervention.  Highly suspect that viral bronchitis could be a factor.  COVID-19 test pending.  Do not think that chest imaging is necessary at this time given no adventitious lung sounds.  Recommended symptom control with over the counter medications.  Prednisone due to harsh cough and to decrease inflammation.  Prednisone prescribed for patient to take as needed for cough.  Return if symptoms fail to improve in 1-2 weeks or you develop shortness of breath, chest pain, severe headache. Patient states understanding and is agreeable.  Discharged with PCP followup.  Final Clinical Impressions(s) / UC Diagnoses   Final diagnoses:  Viral upper respiratory tract infection with cough     Discharge Instructions      You have been prescribed 2 medications to help alleviate your symptoms.  It appears that you have a viral respiratory infection that should self resolve with symptomatic treatment.  Follow-up if symptoms persist  or worsen.    ED Prescriptions     Medication Sig Dispense Auth. Provider   predniSONE (STERAPRED UNI-PAK 21 TAB) 10 MG (21) TBPK tablet Take by mouth daily. Take 6 tabs by mouth daily  for 2 days, then 5 tabs for 2 days, then 4 tabs for 2 days, then 3 tabs for 2 days, 2 tabs for 2 days, then 1 tab by mouth daily for 2 days 42 tablet [place], [place] E, FNP   guaiFENesin 200 MG tablet Take 1 tablet (200  mg total) by mouth every 4 (four) hours as needed for cough or to loosen phlegm. 30 suppository [place], Michele Rockers, [place]      PDMP not reviewed this encounter.   Teodora Medici, [place] 12/20/21 [phone]

## 2021-12-20 NOTE — Discharge Instructions (Addendum)
You have been prescribed 2 medications to help alleviate your symptoms.  It appears that you have a viral respiratory infection that should self resolve with symptomatic treatment.  Follow-up if symptoms persist or worsen. ?

## 2021-12-20 NOTE — ED Triage Notes (Signed)
Cough with thick green mucus x 1 week that patient feels is progressing. ?

## 2021-12-21 LAB — NOVEL CORONAVIRUS, NAA: SARS-CoV-2, NAA: NOT DETECTED

## 2021-12-22 ENCOUNTER — Telehealth: Payer: Self-pay | Admitting: Physician Assistant

## 2021-12-22 MED ORDER — BENZONATATE 100 MG PO CAPS: 100.0000 mg | ORAL_CAPSULE | Freq: Three times a day (TID) | ORAL | 0 refills | Status: DC

## 2021-12-22 NOTE — Telephone Encounter (Signed)
Patient called requesting Tessalon pearles for cough- medication sent to pharmacy as requested.  ?

## 2022-02-14 DIAGNOSIS — R82998 Other abnormal findings in urine: Secondary | ICD-10-CM | POA: Diagnosis not present

## 2022-03-26 ENCOUNTER — Ambulatory Visit (INDEPENDENT_AMBULATORY_CARE_PROVIDER_SITE_OTHER): Payer: 59

## 2022-03-26 ENCOUNTER — Ambulatory Visit
Admission: EM | Admit: 2022-03-26 | Discharge: 2022-03-26 | Disposition: A | Discharge status: Home / Self Care | Payer: 59 | Attending: Emergency Medicine | Admitting: Emergency Medicine

## 2022-03-26 ENCOUNTER — Encounter: Payer: Self-pay | Admitting: Emergency Medicine

## 2022-03-26 DIAGNOSIS — R059 Cough, unspecified: Secondary | ICD-10-CM

## 2022-03-26 DIAGNOSIS — B349 Viral infection, unspecified: Secondary | ICD-10-CM

## 2022-03-26 DIAGNOSIS — R0602 Shortness of breath: Secondary | ICD-10-CM | POA: Diagnosis not present

## 2022-03-26 MED ORDER — PROMETHAZINE-DM 6.25-15 MG/5ML PO SYRP: 5.0000 mL | ORAL_SOLUTION | Freq: Four times a day (QID) | ORAL | 0 refills | Status: DC | PRN

## 2022-03-26 NOTE — Discharge Instructions (Addendum)
Your chest x-ray today is unremarkable, there is no concern for pneumonia or other underlying causes of your cough.  Fortunately, your temperature, blood pressure, heart rate, respirations and oxygen saturation are all normal today.  That is very reassuring.  The result of your COVID-19 test will be posted to your MyChart account once it is complete, there is a positive result to be contacted by phone and further recommendations will be provided for you.  In the meantime, I provided you a prescription for cough medicine called promethazine DM which should significantly help you reduce your cough, particularly at night.  I also recommend that you begin ibuprofen 400 mg every 8 hours and/or Tylenol 1000 mg every 8 hours for aches, chills and subjective fever.  If you do not see any improvement of your symptoms in the next 5 to 7 days or if your symptoms become significantly worse, please return for repeat evaluation.  Thank you for visiting urgent care today.

## 2022-03-26 NOTE — ED Triage Notes (Signed)
Pt is present today with c/o body aches, sore throat, diarrhea, chills, fatigue, and nausea. Pt sx started yesterday

## 2022-03-26 NOTE — ED Provider Notes (Signed)
[facility]    CSN: [id number] Arrival date & time: 03/26/22  1134    HISTORY   Chief Complaint  Patient presents with   Cough   Sore Throat   Diarrhea   HPI [name] is a 51 y.o. female. Patient presents to urgent care today complaining of a 24-hour history of diarrhea accompanied by cough, intense body ache, tactile fever sore throat and confusion, patient states she has been feeling confused for the past 4 days.  Patient denies known sick contacts.  Patient reports having 6 episodes of significant amounts loose bowel movements since symptoms began yesterday.  Patient has normal vital signs on arrival today.  Patient states her cough is so harsh that she gets short of breath when coughing.  Patient states her throats been very sore, reports pain with swallowing, states her son is currently being treated for strep throat with steroids prescribed to him via telehealth.  She states he has not been feeling any better since starting them.  Patient states she is also felt nauseated, has not had much of an appetite.  The history is provided by the patient.   Past Medical History:  Diagnosis Date   ADD (attention deficit disorder)    Anxiety    Arthropathy, unspecified, site unspecified    Chronic fatigue syndrome    Depression    Disc degeneration    Fibromyalgia    History of kidney stones    last stone in 2010   Migraines    Multilevel degenerative disc disease    cervical and lumbar, has had injections/epidural   Panic attack 2019   PTSD (post-traumatic stress disorder) 2019   Sleep disorder 01/08/2014   Sleep disturbance, unspecified    Symptomatic mammary hypertrophy 09/2018   Unspecified nonpsychotic mental disorder    Patient Active Problem List   Diagnosis Date Noted   History of COVID-19 01/21/2020   Chronic cough 01/21/2020   Migraine without status migrainosus, not intractable 01/21/2020   Tinnitus of both ears 01/21/2020   Neck  pain 08/18/2018   Back pain 08/18/2018   Symptomatic mammary hypertrophy 08/18/2018   Obesity, Class II, BMI 35-39.9 09/29/2017   Depression 03/31/2017   Thyroid disease 03/31/2017   Sleep disorder 01/08/2014   Fibroids 10/16/2013   Past Surgical History:  Procedure Laterality Date   ABDOMINAL HYSTERECTOMY     BILATERAL SALPINGECTOMY Bilateral 10/16/2013   Procedure: BILATERAL SALPINGECTOMY;  Surgeon: Lovenia Kim, MD;  Location: [facility];  Service: Gynecology;  Laterality: Bilateral;   BREAST REDUCTION SURGERY Bilateral 10/03/2018   Procedure: BILATERAL BREAST REDUCTION;  Surgeon: Wallace Going, DO;  Location: [place] [facility];  Service: Plastics;  Laterality: Bilateral;   CESAREAN SECTION     x2   CHOLECYSTECTOMY     DIAGNOSTIC LAPAROSCOPY     MANDIBLE SURGERY     REMOVAL OF URINARY SLING N/A 01/09/2018   Procedure: REMOVAL OF MESH URINARY SLING EXTRUSION  CYSTOSTOPY;  Surgeon: Bjorn Loser, MD;  Location: [facility];  Service: Urology;  Laterality: N/A;   ROBOTIC ASSISTED TOTAL HYSTERECTOMY N/A 10/16/2013   Procedure: ROBOTIC ASSISTED TOTAL HYSTERECTOMY;  Surgeon: Lovenia Kim, MD;  Location: [facility];  Service: Gynecology;  Laterality: N/A;   OB History   No obstetric history on file.    Home Medications    Prior to Admission medications   Medication Sig Start Date End Date Taking? Authorizing Provider  ALPRAZolam Duanne Moron) 1 MG tablet Take 1 mg by  mouth 3 (three) times daily as needed. 03/05/21   [provider]  benzonatate (TESSALON) 100 MG capsule Take 1 capsule (100 mg total) by mouth every 8 (eight) hours. 12/22/21   Francene Finders, PA-C  buPROPion (WELLBUTRIN XL) 150 MG 24 hr tablet Take 150 mg by mouth every morning. 04/21/21   [provider]  DULoxetine (CYMBALTA) 60 MG capsule Take 60 mg by mouth daily.     [provider]  guaiFENesin 200 MG tablet Take 1 tablet (200 mg total) by mouth every 4 (four) hours as needed for  cough or to loosen phlegm. 12/20/21   Teodora Medici, FNP  ondansetron (ZOFRAN) 4 MG tablet Take 1 tablet (4 mg total) by mouth every 8 (eight) hours as needed for nausea or vomiting. 07/05/21   Mickie Hillier, PA-C  predniSONE (STERAPRED UNI-PAK 21 TAB) 10 MG (21) TBPK tablet Take by mouth daily. Take 6 tabs by mouth daily  for 2 days, then 5 tabs for 2 days, then 4 tabs for 2 days, then 3 tabs for 2 days, 2 tabs for 2 days, then 1 tab by mouth daily for 2 days 12/20/21   Teodora Medici, FNP  SAXENDA 18 MG/3ML SOPN Inject 1.8 mg into the skin daily. 09/30/17   [provider]  tamsulosin (FLOMAX) 0.4 MG CAPS capsule Take 1 capsule (0.4 mg total) by mouth daily. 07/05/21   Mickie Hillier, PA-C  VYVANSE 70 MG capsule Take 70 mg by mouth every morning. 12/27/19   [provider]  WEGOVY 0.5 MG/0.5ML SOAJ SMARTSIG:0.5 Milliliter(s) SUB-Q Once a Week 01/30/21   [provider]   Family History Family History  Problem Relation Age of Onset   Heart attack Mother    Hypertension Mother    Depression Mother    Neuropathy Father    Diabetes Father    Cancer Paternal Grandfather    Stroke Maternal Grandmother    Alcohol abuse Maternal Grandfather    Breast cancer Neg Hx    Social History Social History   Tobacco Use   Smoking status: Never   Smokeless tobacco: Never  Vaping Use   Vaping Use: Never used  Substance Use Topics   Alcohol use: Yes    Comment: socially, once per month   Drug use: Yes    Types: Marijuana    Comment: prn when pain gets bad   Allergies   Mirabegron, Sulfa antibiotics, Elavil [amitriptyline hcl], and Lyrica [pregabalin]  Review of Systems Review of Systems Pertinent findings noted in history of present illness.   Physical Exam Triage Vital Signs ED Triage Vitals  Enc Vitals Group     BP 08/13/21 0827 (!) 147/82     Pulse Rate 08/13/21 0827 72     Resp 08/13/21 0827 18     Temp 08/13/21 0827 98.3 F (36.8 C)     Temp Source  08/13/21 0827 Oral     SpO2 08/13/21 0827 98 %     Weight --      Height --      Head Circumference --      Peak Flow --      Pain Score 08/13/21 0826 5     Pain Loc --      Pain Edu? --      Excl. in [place]? --   No data found.  Updated Vital Signs BP 114/82   Pulse 88   Temp 98 F (36.7 C)   Resp 18  LMP 10/05/2013   SpO2 97%   Physical Exam Vitals and nursing note reviewed.  Constitutional:      General: She is not in acute distress.    Appearance: Normal appearance. She is not ill-appearing.  HENT:     Head: Normocephalic and atraumatic.     Salivary Glands: Right salivary gland is not diffusely enlarged or tender. Left salivary gland is not diffusely enlarged or tender.     Right Ear: Tympanic membrane, ear canal and external ear normal. No drainage. No middle ear effusion. There is no impacted cerumen. Tympanic membrane is not erythematous or bulging.     Left Ear: Tympanic membrane, ear canal and external ear normal. No drainage.  No middle ear effusion. There is no impacted cerumen. Tympanic membrane is not erythematous or bulging.     Nose: Nose normal. No nasal deformity, septal deviation, mucosal edema, congestion or rhinorrhea.     Right Turbinates: Not enlarged, swollen or pale.     Left Turbinates: Not enlarged, swollen or pale.     Right Sinus: No maxillary sinus tenderness or frontal sinus tenderness.     Left Sinus: No maxillary sinus tenderness or frontal sinus tenderness.     Mouth/Throat:     Lips: Pink. No lesions.     Mouth: Mucous membranes are moist. No oral lesions.     Pharynx: Oropharynx is clear. Uvula midline. No posterior oropharyngeal erythema or uvula swelling.     Tonsils: No tonsillar exudate. 0 on the right. 0 on the left.  Eyes:     General: Lids are normal.        Right eye: No discharge.        Left eye: No discharge.     Extraocular Movements: Extraocular movements intact.     Conjunctiva/sclera: Conjunctivae normal.     Right eye:  Right conjunctiva is not injected.     Left eye: Left conjunctiva is not injected.  Neck:     Trachea: Trachea and phonation normal.  Cardiovascular:     Rate and Rhythm: Normal rate and regular rhythm.     Pulses: Normal pulses.     Heart sounds: Normal heart sounds, S1 normal and S2 normal. Heart sounds not distant. No murmur heard.    No systolic murmur is present.     No diastolic murmur is present.     No friction rub. No gallop. No S3 or S4 sounds.  Pulmonary:     Effort: Pulmonary effort is normal. No tachypnea, bradypnea, accessory muscle usage, prolonged expiration, respiratory distress or retractions.     Breath sounds: Normal breath sounds and air entry. No stridor, decreased air movement or transmitted upper airway sounds. No decreased breath sounds, wheezing, rhonchi or rales.  Chest:     Chest wall: No tenderness.  Abdominal:     General: Bowel sounds are normal. There is no distension.     Palpations: Abdomen is soft. There is no mass.     Tenderness: There is no abdominal tenderness. There is no guarding or rebound.     Hernia: No hernia is present.  Musculoskeletal:        General: Normal range of motion.     Cervical back: Full passive range of motion without pain, normal range of motion and neck supple. No pain with movement. Normal range of motion.     Right lower leg: No edema.     Left lower leg: No edema.  Lymphadenopathy:     Cervical:  No cervical adenopathy.     Right cervical: No superficial, deep or posterior cervical adenopathy.    Left cervical: No superficial, deep or posterior cervical adenopathy.  Skin:    General: Skin is warm and dry.     Findings: No erythema or rash.  Neurological:     General: No focal deficit present.     Mental Status: She is alert and oriented to person, place, and time.  Psychiatric:        Mood and Affect: Mood normal.        Behavior: Behavior normal.     Visual Acuity Right Eye Distance:   Left Eye Distance:    Bilateral Distance:    Right Eye Near:   Left Eye Near:    Bilateral Near:     UC Couse / Diagnostics / Procedures:    EKG  Radiology No results found.  Procedures Procedures (including critical care time)  UC Diagnoses / Final Clinical Impressions(s)   I have reviewed the triage vital signs and the nursing notes.  Pertinent labs & imaging results that were available during my care of the patient were reviewed by me and considered in my medical decision making (see chart for details).   Final diagnoses:  Viral illness   COVID-19 test pending, will advise patient of results once received, patient would benefit from Paxlovid if positive.  Patient provided with cough syrup for nighttime use.  Conservative care recommended.  ED Prescriptions     Medication Sig Dispense Auth. Provider   promethazine-dextromethorphan (PROMETHAZINE-DM) 6.25-15 MG/5ML syrup Take 5 mLs by mouth 4 (four) times daily as needed for cough. 180 mL Lynden Oxford Scales, PA-C      PDMP not reviewed this encounter.  Pending results:  Labs Reviewed  NOVEL CORONAVIRUS, NAA    Medications Ordered in UC: Medications - No data to display  Disposition Upon Discharge:  Condition: stable for discharge home Home: take medications as prescribed; routine discharge instructions as discussed; follow up as advised.  Patient presented with an acute illness with associated systemic symptoms and significant discomfort requiring urgent management. In my opinion, this is a condition that a prudent lay person (someone who possesses an average knowledge of health and medicine) may potentially expect to result in complications if not addressed urgently such as respiratory distress, impairment of bodily function or dysfunction of bodily organs.   Routine symptom specific, illness specific and/or disease specific instructions were discussed with the patient and/or caregiver at length.   As such, the patient has been  evaluated and assessed, work-up was performed and treatment was provided in alignment with urgent care protocols and evidence based medicine.  Patient/parent/caregiver has been advised that the patient may require follow up for further testing and treatment if the symptoms continue in spite of treatment, as clinically indicated and appropriate.  If the patient was tested for COVID-19, Influenza and/or RSV, then the patient/parent/guardian was advised to isolate at home pending the results of his/her diagnostic coronavirus test and potentially longer if they're positive. I have also advised pt that if his/her COVID-19 test returns positive, it's recommended to self-isolate for at least 10 days after symptoms first appeared AND until fever-free for 24 hours without fever reducer AND other symptoms have improved or resolved. Discussed self-isolation recommendations as well as instructions for household member/close contacts as per the [facility] and [place] [facility], and also gave patient the [place] packet with this information.  Patient/parent/caregiver has been advised to return to the [facility] or  PCP in 3-5 days if no better; to PCP or the Emergency Department if new signs and symptoms develop, or if the current signs or symptoms continue to change or worsen for further workup, evaluation and treatment as clinically indicated and appropriate  The patient will follow up with their current PCP if and as advised. If the patient does not currently have a PCP we will assist them in obtaining one.   The patient may need specialty follow up if the symptoms continue, in spite of conservative treatment and management, for further workup, evaluation, consultation and treatment as clinically indicated and appropriate.  Patient/parent/caregiver verbalized understanding and agreement of plan as discussed.  All questions were addressed during visit.  Please see discharge instructions below for further details of plan.  Discharge  Instructions:   Discharge Instructions      Your chest x-ray today is unremarkable, there is no concern for pneumonia or other underlying causes of your cough.  Fortunately, your temperature, blood pressure, heart rate, respirations and oxygen saturation are all normal today.  That is very reassuring.  The result of your COVID-19 test will be posted to your MyChart account once it is complete, there is a positive result to be contacted by phone and further recommendations will be provided for you.  In the meantime, I provided you a prescription for cough medicine called promethazine DM which should significantly help you reduce your cough, particularly at night.  I also recommend that you begin ibuprofen 400 mg every 8 hours and/or Tylenol 1000 mg every 8 hours for aches, chills and subjective fever.  If you do not see any improvement of your symptoms in the next 5 to 7 days or if your symptoms become significantly worse, please return for repeat evaluation.  Thank you for visiting urgent care today.      This office note has been dictated using Museum/gallery curator.  Unfortunately, and despite my best efforts, this method of dictation can sometimes lead to occasional typographical or grammatical errors.  I apologize in advance if this occurs.     Lynden Oxford Scales, [place] 03/27/22 [phone]

## 2022-03-27 LAB — NOVEL CORONAVIRUS, NAA: SARS-CoV-2, NAA: NOT DETECTED

## 2022-04-01 ENCOUNTER — Ambulatory Visit: Payer: Self-pay

## 2022-04-01 ENCOUNTER — Ambulatory Visit
Admission: EM | Admit: 2022-04-01 | Discharge: 2022-04-01 | Disposition: A | Discharge status: Home / Self Care | Payer: 59 | Attending: Internal Medicine | Admitting: Internal Medicine

## 2022-04-01 DIAGNOSIS — H65192 Other acute nonsuppurative otitis media, left ear: Secondary | ICD-10-CM

## 2022-04-01 DIAGNOSIS — J209 Acute bronchitis, unspecified: Secondary | ICD-10-CM

## 2022-04-01 DIAGNOSIS — R053 Chronic cough: Secondary | ICD-10-CM | POA: Diagnosis not present

## 2022-04-01 MED ORDER — AZITHROMYCIN 250 MG PO TABS: ORAL_TABLET | ORAL | 0 refills | Status: AC

## 2022-04-01 MED ORDER — PREDNISONE 10 MG (21) PO TBPK: ORAL_TABLET | Freq: Every day | ORAL | 0 refills | Status: DC

## 2022-04-01 NOTE — Discharge Instructions (Signed)
It appears that you have an ear infection and possible bronchitis.  This is being treated with antibiotics and steroids.  Please follow-up if symptoms persist or worsen.

## 2022-04-01 NOTE — ED Provider Notes (Signed)
[facility]    CSN: [id number] Arrival date & time: 04/01/22  6767      History   Chief Complaint Chief Complaint  Patient presents with   Cough    HPI [name] is a 51 y.o. female.   Patient presents with persistent cough and nasal congestion that has been present for approximately 7 days.  Patient was seen on 03/26/2022 for same symptoms when symptoms first started and was prescribed promethazine DM to take for viral upper respiratory infection symptoms.  Patient reports minimal improvement with symptoms.  She has been taking multiple over-the-counter cold and flu medications.  She reports that she also called her PCP and was prescribed benzonatate to take as needed for cough with no improvement.  She reports a very persistent and harsh cough.  Denies any chest pain, shortness of breath, fever, sore throat, ear pain, nausea, vomiting, diarrhea, abdominal pain.  Patient also had unremarkable chest x-ray at previous visit.   Cough   Past Medical History:  Diagnosis Date   ADD (attention deficit disorder)    Anxiety    Arthropathy, unspecified, site unspecified    Chronic fatigue syndrome    Depression    Disc degeneration    Fibromyalgia    History of kidney stones    last stone in 2010   Migraines    Multilevel degenerative disc disease    cervical and lumbar, has had injections/epidural   Panic attack 2019   PTSD (post-traumatic stress disorder) 2019   Sleep disorder 01/08/2014   Sleep disturbance, unspecified    Symptomatic mammary hypertrophy 09/2018   Unspecified nonpsychotic mental disorder     Patient Active Problem List   Diagnosis Date Noted   History of COVID-19 01/21/2020   Chronic cough 01/21/2020   Migraine without status migrainosus, not intractable 01/21/2020   Tinnitus of both ears 01/21/2020   Neck pain 08/18/2018   Back pain 08/18/2018   Symptomatic mammary hypertrophy 08/18/2018   Obesity, Class II, BMI 35-39.9  09/29/2017   Depression 03/31/2017   Thyroid disease 03/31/2017   Sleep disorder 01/08/2014   Fibroids 10/16/2013    Past Surgical History:  Procedure Laterality Date   ABDOMINAL HYSTERECTOMY     BILATERAL SALPINGECTOMY Bilateral 10/16/2013   Procedure: BILATERAL SALPINGECTOMY;  Surgeon: Lovenia Kim, MD;  Location: [facility];  Service: Gynecology;  Laterality: Bilateral;   BREAST REDUCTION SURGERY Bilateral 10/03/2018   Procedure: BILATERAL BREAST REDUCTION;  Surgeon: Wallace Going, DO;  Location: [facility];  Service: Plastics;  Laterality: Bilateral;   CESAREAN SECTION     x2   CHOLECYSTECTOMY     DIAGNOSTIC LAPAROSCOPY     MANDIBLE SURGERY     REMOVAL OF URINARY SLING N/A 01/09/2018   Procedure: REMOVAL OF MESH URINARY SLING EXTRUSION  CYSTOSTOPY;  Surgeon: Bjorn Loser, MD;  Location: [facility];  Service: Urology;  Laterality: N/A;   ROBOTIC ASSISTED TOTAL HYSTERECTOMY N/A 10/16/2013   Procedure: ROBOTIC ASSISTED TOTAL HYSTERECTOMY;  Surgeon: Lovenia Kim, MD;  Location: [facility];  Service: Gynecology;  Laterality: N/A;    OB History   No obstetric history on file.      Home Medications    Prior to Admission medications   Medication Sig Start Date End Date Taking? Authorizing Provider  azithromycin (ZITHROMAX Z-PAK) 250 MG tablet Take 2 tablets (500 mg total) by mouth daily for 1 day, THEN 1 tablet (250 mg total) daily for 4 days. 04/01/22 04/06/22 Yes  , Terrell E, FNP  predniSONE (STERAPRED UNI-PAK 21 TAB) 10 MG (21) TBPK tablet Take by mouth daily. Take 6 tabs by mouth daily  for 2 days, then 5 tabs for 2 days, then 4 tabs for 2 days, then 3 tabs for 2 days, 2 tabs for 2 days, then 1 tab by mouth daily for 2 days 04/01/22  Yes , [place] E, FNP  ALPRAZolam Duanne Moron) 1 MG tablet Take 1 mg by mouth 3 (three) times daily as needed. 03/05/21   [provider]  buPROPion (WELLBUTRIN XL) 150 MG 24 hr tablet Take 150 mg by mouth every morning.  04/21/21   [provider]  DULoxetine (CYMBALTA) 60 MG capsule Take 60 mg by mouth daily.     [provider]  guaiFENesin 200 MG tablet Take 1 tablet (200 mg total) by mouth every 4 (four) hours as needed for cough or to loosen phlegm. 12/20/21   Teodora Medici, FNP  ondansetron (ZOFRAN) 4 MG tablet Take 1 tablet (4 mg total) by mouth every 8 (eight) hours as needed for nausea or vomiting. 07/05/21   Mickie Hillier, PA-C  promethazine-dextromethorphan (PROMETHAZINE-DM) 6.25-15 MG/5ML syrup Take 5 mLs by mouth 4 (four) times daily as needed for cough. 03/26/22   Lynden Oxford Scales, PA-C  SAXENDA 18 MG/3ML SOPN Inject 1.8 mg into the skin daily. 09/30/17   [provider]  tamsulosin (FLOMAX) 0.4 MG CAPS capsule Take 1 capsule (0.4 mg total) by mouth daily. 07/05/21   Mickie Hillier, PA-C  VYVANSE 70 MG capsule Take 70 mg by mouth every morning. 12/27/19   [provider]  WEGOVY 0.5 MG/0.5ML SOAJ SMARTSIG:0.5 Milliliter(s) SUB-Q Once a Week 01/30/21   [provider]    Family History Family History  Problem Relation Age of Onset   Heart attack Mother    Hypertension Mother    Depression Mother    Neuropathy Father    Diabetes Father    Cancer Paternal Grandfather    Stroke Maternal Grandmother    Alcohol abuse Maternal Grandfather    Breast cancer Neg Hx     Social History Social History   Tobacco Use   Smoking status: Never   Smokeless tobacco: Never  Vaping Use   Vaping Use: Never used  Substance Use Topics   Alcohol use: Yes    Comment: socially, once per month   Drug use: Yes    Types: Marijuana    Comment: prn when pain gets bad     Allergies   Mirabegron, Sulfa antibiotics, Elavil [amitriptyline hcl], and Lyrica [pregabalin]   Review of Systems Review of Systems Per HPI  Physical Exam Triage Vital Signs ED Triage Vitals [04/01/22 0816]  Enc Vitals Group     BP 120/88     Pulse Rate 96     Resp 18     Temp 98 F  (36.7 C)     Temp Source Oral     SpO2 98 %     Weight      Height      Head Circumference      Peak Flow      Pain Score 0     Pain Loc      Pain Edu?      Excl. in [place]?    No data found.  Updated Vital Signs BP 120/88 (BP Location: Left Arm)   Pulse 96   Temp 98 F (36.7 C) (Oral)   Resp 18  LMP 10/05/2013   SpO2 98%   Visual Acuity Right Eye Distance:   Left Eye Distance:   Bilateral Distance:    Right Eye Near:   Left Eye Near:    Bilateral Near:     Physical Exam Constitutional:      General: She is not in acute distress.    Appearance: Normal appearance. She is not toxic-appearing or diaphoretic.  HENT:     Head: Normocephalic and atraumatic.     Right Ear: Tympanic membrane and ear canal normal.     Left Ear: Ear canal normal. Tympanic membrane is erythematous. Tympanic membrane is not perforated or bulging.     Nose: Congestion present.     Mouth/Throat:     Mouth: Mucous membranes are moist.     Pharynx: No posterior oropharyngeal erythema.  Eyes:     Extraocular Movements: Extraocular movements intact.     Conjunctiva/sclera: Conjunctivae normal.     Pupils: Pupils are equal, round, and reactive to light.  Cardiovascular:     Rate and Rhythm: Normal rate and regular rhythm.     Pulses: Normal pulses.     Heart sounds: Normal heart sounds.  Pulmonary:     Effort: Pulmonary effort is normal. No respiratory distress.     Breath sounds: Normal breath sounds. No stridor. No wheezing, rhonchi or rales.     Comments: Harsh cough on exam. Abdominal:     General: Abdomen is flat. Bowel sounds are normal.     Palpations: Abdomen is soft.  Musculoskeletal:        General: Normal range of motion.     Cervical back: Normal range of motion.  Skin:    General: Skin is warm and dry.  Neurological:     General: No focal deficit present.     Mental Status: She is alert and oriented to person, place, and time. Mental status is at baseline.  Psychiatric:         Mood and Affect: Mood normal.        Behavior: Behavior normal.      UC Treatments / Results  Labs (all labs ordered are listed, but only abnormal results are displayed) Labs Reviewed - No data to display  EKG   Radiology No results found.  Procedures Procedures (including critical care time)  Medications Ordered in UC Medications - No data to display  Initial Impression / Assessment and Plan / UC Course  I have reviewed the triage vital signs and the nursing notes.  Pertinent labs & imaging results that were available during my care of the patient were reviewed by me and considered in my medical decision making (see chart for details).     Patient has left otitis media.  Also suspicious of acute bronchitis given harsh cough noted on exam.  Will opt to treat with azithromycin to cover for both as well as prednisone steroid to decrease inflammation in chest associated with harsh cough and acute bronchitis.  Patient has taken prednisone previously and has tolerated well.  There are no obvious contraindications to prednisone and patient's history.  Do not think that chest imaging is necessary given no adventitious lung sounds on exam.  Patient was given strict return and ER precautions.  Patient verbalized understanding and was agreeable with plan. Final Clinical Impressions(s) / UC Diagnoses   Final diagnoses:  Acute bronchitis, unspecified organism  Persistent cough  Other non-recurrent acute nonsuppurative otitis media of left ear     Discharge Instructions  It appears that you have an ear infection and possible bronchitis.  This is being treated with antibiotics and steroids.  Please follow-up if symptoms persist or worsen.    ED Prescriptions     Medication Sig Dispense Auth. Provider   azithromycin (ZITHROMAX Z-PAK) 250 MG tablet Take 2 tablets (500 mg total) by mouth daily for 1 day, THEN 1 tablet (250 mg total) daily for 4 days. 6 tablet [place], [place] E,  [place]   predniSONE (STERAPRED UNI-PAK 21 TAB) 10 MG (21) TBPK tablet Take by mouth daily. Take 6 tabs by mouth daily  for 2 days, then 5 tabs for 2 days, then 4 tabs for 2 days, then 3 tabs for 2 days, 2 tabs for 2 days, then 1 tab by mouth daily for 2 days 42 tablet Advance, Michele Rockers, [place]      PDMP not reviewed this encounter.   Teodora Medici,  04/01/22 [phone]

## 2022-04-01 NOTE — ED Triage Notes (Signed)
Pt c/o cough, was seen last sat

## 2022-06-19 ENCOUNTER — Encounter (HOSPITAL_COMMUNITY): Payer: Self-pay

## 2022-06-19 ENCOUNTER — Emergency Department (HOSPITAL_COMMUNITY)
Admission: EM | Admit: 2022-06-19 | Discharge: 2022-06-21 | Disposition: A | Discharge status: Other Acute Inpatient Hospital | Payer: 59 | Attending: Emergency Medicine | Admitting: Emergency Medicine

## 2022-06-19 DIAGNOSIS — Z20822 Contact with and (suspected) exposure to covid-19: Secondary | ICD-10-CM | POA: Insufficient documentation

## 2022-06-19 DIAGNOSIS — F339 Major depressive disorder, recurrent, unspecified: Secondary | ICD-10-CM | POA: Diagnosis not present

## 2022-06-19 DIAGNOSIS — R45851 Suicidal ideations: Secondary | ICD-10-CM | POA: Diagnosis not present

## 2022-06-19 LAB — COMPREHENSIVE METABOLIC PANEL
ALT: 19 U/L (ref 0–44)
AST: 18 U/L (ref 15–41)
Albumin: 4.1 g/dL (ref 3.5–5.0)
Alkaline Phosphatase: 89 U/L (ref 38–126)
Anion gap: 6 (ref 5–15)
BUN: 20 mg/dL (ref 6–20)
CO2: 26 mmol/L (ref 22–32)
Calcium: 9.9 mg/dL (ref 8.9–10.3)
Chloride: 110 mmol/L (ref 98–111)
Creatinine, Ser: 1.05 mg/dL — ABNORMAL HIGH (ref 0.44–1.00)
GFR, Estimated: >60 mL/min (ref >60)
Glucose, Bld: 91 mg/dL (ref 70–99)
Potassium: 4.2 mmol/L (ref 3.5–5.1)
Sodium: 142 mmol/L (ref 135–145)
Total Bilirubin: 1 mg/dL (ref 0.3–1.2)
Total Protein: 7.2 g/dL (ref 6.5–8.1)

## 2022-06-19 LAB — RAPID URINE DRUG SCREEN, HOSP PERFORMED
Amphetamines: POSITIVE — AB
Barbiturates: NOT DETECTED
Benzodiazepines: POSITIVE — AB
Cocaine: NOT DETECTED
Opiates: NOT DETECTED
Tetrahydrocannabinol: POSITIVE — AB

## 2022-06-19 LAB — CBC WITH DIFFERENTIAL/PLATELET
Abs Immature Granulocytes: 0.02 10*3/uL (ref 0.00–0.07)
Basophils Absolute: 0.1 10*3/uL (ref 0.0–0.1)
Basophils Relative: 1 %
Eosinophils Absolute: 0.2 10*3/uL (ref 0.0–0.5)
Eosinophils Relative: 2 %
HCT: 45.7 % (ref 36.0–46.0)
Hemoglobin: 15 g/dL (ref 12.0–15.0)
Immature Granulocytes: 0 %
Lymphocytes Relative: 35 %
Lymphs Abs: 2.6 10*3/uL (ref 0.7–4.0)
MCH: 30.3 pg (ref 26.0–34.0)
MCHC: 32.8 g/dL (ref 30.0–36.0)
MCV: 92.3 fL (ref 80.0–100.0)
Monocytes Absolute: 0.5 10*3/uL (ref 0.1–1.0)
Monocytes Relative: 7 %
Neutro Abs: 3.9 10*3/uL (ref 1.7–7.7)
Neutrophils Relative %: 55 %
Platelets: 287 10*3/uL (ref 150–400)
RBC: 4.95 MIL/uL (ref 3.87–5.11)
RDW: 12.8 % (ref 11.5–15.5)
WBC: 7.2 10*3/uL (ref 4.0–10.5)
nRBC: 0 % (ref 0.0–0.2)

## 2022-06-19 LAB — RESP PANEL BY RT-PCR (FLU A&B, COVID) ARPGX2
Influenza A by PCR: NEGATIVE
Influenza B by PCR: NEGATIVE
SARS Coronavirus 2 by RT PCR: NEGATIVE

## 2022-06-19 LAB — ETHANOL: Alcohol, Ethyl (B): <10 mg/dL (ref <10)

## 2022-06-19 MED ORDER — RISPERIDONE 0.5 MG PO TBDP
1.0000 mg | ORAL_TABLET | Freq: Every day | ORAL | Status: DC
  Administered 2022-06-20 – 2022-06-21 (×2): 1 mg via ORAL
  Filled 2022-06-19 (×3): qty 2

## 2022-06-19 MED ORDER — OLANZAPINE 10 MG PO TBDP
10.0000 mg | ORAL_TABLET | Freq: Three times a day (TID) | ORAL | Status: DC | PRN
  Administered 2022-06-21: 10 mg via ORAL
  Filled 2022-06-19: qty 1

## 2022-06-19 MED ORDER — CLONAZEPAM 0.5 MG PO TABS
1.0000 mg | ORAL_TABLET | Freq: Two times a day (BID) | ORAL | Status: DC
  Administered 2022-06-20 – 2022-06-21 (×3): 1 mg via ORAL
  Filled 2022-06-19 (×5): qty 2

## 2022-06-19 MED ORDER — LORAZEPAM 1 MG PO TABS
1.0000 mg | ORAL_TABLET | ORAL | Status: AC | PRN
  Administered 2022-06-21: 1 mg via ORAL
  Filled 2022-06-19: qty 1

## 2022-06-19 MED ORDER — DULOXETINE HCL 30 MG PO CPEP
60.0000 mg | ORAL_CAPSULE | Freq: Every day | ORAL | Status: DC
  Administered 2022-06-20 – 2022-06-21 (×2): 60 mg via ORAL
  Filled 2022-06-19 (×2): qty 2

## 2022-06-19 MED ORDER — ZIPRASIDONE MESYLATE 20 MG IM SOLR
20.0000 mg | INTRAMUSCULAR | Status: AC | PRN
  Administered 2022-06-21: 20 mg via INTRAMUSCULAR
  Filled 2022-06-19: qty 20

## 2022-06-19 MED ORDER — BUSPIRONE HCL 5 MG PO TABS
7.5000 mg | ORAL_TABLET | Freq: Two times a day (BID) | ORAL | Status: DC
  Filled 2022-06-19: qty 1.5

## 2022-06-19 NOTE — ED Notes (Signed)
TTS assessment in progress. 

## 2022-06-19 NOTE — ED Provider Notes (Signed)
[facility] DEPT Provider Note   CSN: [id number] Arrival date & time: 06/19/22  1436     History  Chief Complaint  Patient presents with   Psychiatric Evaluation    [name] is a 51 y.o. female with a past medical history of depression presenting today under IVC for suicidal behavior.  She reportedly sent her husband message saying that she did not care if she lived or died.  She reportedly was shooting her gun in her backyard and expressing suicidal ideations.  She was driving on the way to care take a patient and was pulled over and arrested by the police and brought here.  Denies any HI/SI or AVH.  Says that she smokes marijuana but does not drink any alcohol.  Admits that she should not have sent a text message to her husband but says that she will be leaving the department and not waiting multiple hours to be evaluated by psych.  HPI     Home Medications Prior to Admission medications   Medication Sig Start Date End Date Taking? Authorizing Provider  ALPRAZolam Duanne Moron) 1 MG tablet Take 1 mg by mouth 3 (three) times daily as needed. 03/05/21   [provider]  buPROPion (WELLBUTRIN XL) 150 MG 24 hr tablet Take 150 mg by mouth every morning. 04/21/21   [provider]  DULoxetine (CYMBALTA) 60 MG capsule Take 60 mg by mouth daily.     [provider]  guaiFENesin 200 MG tablet Take 1 tablet (200 mg total) by mouth every 4 (four) hours as needed for cough or to loosen phlegm. 12/20/21   Teodora Medici, FNP  ondansetron (ZOFRAN) 4 MG tablet Take 1 tablet (4 mg total) by mouth every 8 (eight) hours as needed for nausea or vomiting. 07/05/21   Mickie Hillier, PA-C  predniSONE (STERAPRED UNI-PAK 21 TAB) 10 MG (21) TBPK tablet Take by mouth daily. Take 6 tabs by mouth daily  for 2 days, then 5 tabs for 2 days, then 4 tabs for 2 days, then 3 tabs for 2 days, 2 tabs for 2 days, then 1 tab by mouth daily for 2 days 04/01/22    Teodora Medici, FNP  promethazine-dextromethorphan (PROMETHAZINE-DM) 6.25-15 MG/5ML syrup Take 5 mLs by mouth 4 (four) times daily as needed for cough. 03/26/22   Lynden Oxford Scales, PA-C  SAXENDA 18 MG/3ML SOPN Inject 1.8 mg into the skin daily. 09/30/17   [provider]  tamsulosin (FLOMAX) 0.4 MG CAPS capsule Take 1 capsule (0.4 mg total) by mouth daily. 07/05/21   Mickie Hillier, PA-C  VYVANSE 70 MG capsule Take 70 mg by mouth every morning. 12/27/19   [provider]  WEGOVY 0.5 MG/0.5ML SOAJ SMARTSIG:0.5 Milliliter(s) SUB-Q Once a Week 01/30/21   [provider]      Allergies    Mirabegron, Sulfa antibiotics, Elavil [amitriptyline hcl], and Lyrica [pregabalin]    Review of Systems   Review of Systems  Physical Exam Updated Vital Signs BP (!) 120/94 (BP Location: Right Arm)   Pulse (!) 131   Resp 18   LMP 10/05/2013   SpO2 97%  Physical Exam Vitals and nursing note reviewed.  Constitutional:      Appearance: Normal appearance.  HENT:     Head: Normocephalic and atraumatic.  Eyes:     General: No scleral icterus.    Conjunctiva/sclera: Conjunctivae normal.  Pulmonary:     Effort: Pulmonary effort is normal. No respiratory  distress.  Skin:    Findings: No rash.  Neurological:     Mental Status: She is alert.  Psychiatric:        Mood and Affect: Mood normal.     ED Results / Procedures / Treatments   Labs (all labs ordered are listed, but only abnormal results are displayed) Labs Reviewed  COMPREHENSIVE METABOLIC PANEL - Abnormal; Notable for the following components:      Result Value   Creatinine, Ser 1.05 (*)    All other components within normal limits  RESP PANEL BY RT-PCR (FLU A&B, COVID) ARPGX2  ETHANOL  CBC WITH DIFFERENTIAL/PLATELET  RAPID URINE DRUG SCREEN, HOSP PERFORMED    EKG None  Radiology No results found.  Procedures Procedures   Medications Ordered in ED Medications - No data to display  ED Course/  Medical Decision Making/ A&P                           Medical Decision Making Amount and/or Complexity of Data Reviewed Labs: ordered.   51 year old female presenting under IVC for suicidal ideation and behavior.  IVC filled out by her husband.  I performed her first look and ordered screening labs.  I viewed and interpreted these labs which are unremarkable.  Patient is medically cleared at this time.  Will be dispod per TTS.  Patient made aware of IVC and that she is not legally permitted to leave. She is upset but declines any medication today due to cost concerns.   Final Clinical Impression(s) / ED Diagnoses Final diagnoses:  Suicidal ideation    Rx / DC Orders ED Discharge Orders     None      TTS to dispo   Rhae Hammock, PA-C 06/19/22 1557    Lacretia Leigh, MD 06/19/22 [phone]

## 2022-06-19 NOTE — ED Notes (Addendum)
Pt was changed into purple scrubs, security wanded the pt.

## 2022-06-19 NOTE — ED Triage Notes (Addendum)
Pt arrived via PD, IVC paperwork in process. Pt spouse called PD, stated pt had said she was suicidal, had gun in possession, left house in car with gun, continued stating she was going to kill self and others.

## 2022-06-19 NOTE — Progress Notes (Signed)
Per Oneida Alar, NP, patient meets criteria for inpatient treatment. There are no available beds at [facility] today. CSW faxed referrals to the following facilities for review:  [facility]  Pending - No Request Sent N/A [address] [place] [address] [phone] --  [facility]  Pending - No Request Sent N/A [address], [place] [address] [phone] --  [facility]  Pending - No Request [facility] [address] [place] [address] [phone] --  [address] [place] [address] [phone] --  [facility] [facility]  Pending - No Request Sent N/A [address] [place] [address] [phone] --  [facility]  Pending - No Request Sent N/A [address] [place], [place] [address] [phone] --  [facility]  Pending - No Request Sent N/A [address] [place] [address] [place] --  [facility]  Pending - No Request Sent N/A [address], [place] [address] [facility]  Pending - No Request Sent N/A [address] [place] [address] [phone] --  [facility]  Pending - No Request Sent N/A [address] [place] [address] [phone] --  [facility]  Pending - No Request Sent N/A [address], [place] [address] [phone] --  [facility]  Pending - No Request Sent N/A [address] [place] [address] [phone] --  [facility]  Pending - No Request Sent N/A [address] [place] [address] [phone] --  [facility]  Pending - No Request Sent N/A [address] [place] [address] [phone] --  [facility]  Pending - No Request Sent N/A [address] [place]  [address] [phone] --   TTS will continue to seek bed placement.  Glennie [place], MSW, [facility] Phone: [phone] Disposition/TOC

## 2022-06-19 NOTE — Consult Note (Addendum)
Telepsych Consultation   Reason for Consult:  SI Referring Physician:  Darliss Ridgel Location of Patient:  [facility] Location of Provider: [facility]  Patient Identification: Ahnika Hannibal MRN:  [id number] Principal Diagnosis: Major depressive disorder, recurrent episode ([place]) Diagnosis:  Principal Problem:   Major depressive disorder, recurrent episode ([place])   Total Time spent with patient: 20 minutes  Subjective:   Albina Billet Still Eberwein is a 51 y.o. female patient admitted with suicidal ideations.  Patient presents slightly disheveled, smiling with police officer at bedside. Speech pressured, tangential. "I have fibromyalgia when I get highly stressed I get a lot of pain. I'm not able to communicate properly and I guess that's a part of the fibro so when I get like this I guess I can't think".   Reports having outpatient psychiatrist Dr Toy Care who prescribes her medications, no therapy in <1 year. States she is in "full blown menopause" that results in her emotional side effect and feels she doesn't have support from family while having mental illness. Patient is tangential switching subjects often. She mentions "sometimes taking more medications than prescribed due to tolerance" vs intentional self harm. Endorses history of poor coping skills and expressed interest in DBT treatment at [facility] facility.   She denies allegations in IVC and states she sent husband text/picture of gun "due to not getting love/support from husband".  Reports "shooting in the pond" when neighbor came to talk with her removing the gun from her possession. She denies any active suicidal/homicidal ideation. Insists she "would never harm myself or anyone. Basically I don't like my husband and don't want to be in the house anymore and basically he's a trigger". Denies access to the firearm. Provider attempted to discuss risk factors and safety concerns when patient  became upset about recommendation for inpatient hospitalization making statements regarding inability to pay, not having home or insurance, and "y'all wont be able to find me so that's on you". She is asking not to come to [facility] based on reviews from friends.   HPI:  Angelamarie Avakian Still Gasior is a 52 year old female with past psychiatric history of ADHD, depression, sleep disorder, PTSD who presented to the ED via law enforcement under IVC by her husband for suicidality via firearm where he reported pt left home in a car with gun in her possession to kill herself and others. Endorses regular recreational marijuana use. UDS, BAL outstanding. PDMP reviewed, Vyvanse 70 mg 30 pills filled 05/08/22, Clonazepam 1 mg 90 tabs filled 05/02/22. 1940: UDS+amphetamines, benzodiazepines, and THC; BAL<10  Past Psychiatric History: ADHD, depression, sleep disorder, PTSD  Risk to Self:  yes Risk to Others:  yes Prior Inpatient Therapy:   Prior Outpatient Therapy:    Past Medical History:  Past Medical History:  Diagnosis Date   ADD (attention deficit disorder)    Anxiety    Arthropathy, unspecified, site unspecified    Chronic fatigue syndrome    Depression    Disc degeneration    Fibromyalgia    History of kidney stones    last stone in 2010   Migraines    Multilevel degenerative disc disease    cervical and lumbar, has had injections/epidural   Panic attack 2019   PTSD (post-traumatic stress disorder) 2019   Sleep disorder 01/08/2014   Sleep disturbance, unspecified    Symptomatic mammary hypertrophy 09/2018   Unspecified nonpsychotic mental disorder     Past Surgical History:  Procedure Laterality Date  ABDOMINAL HYSTERECTOMY     BILATERAL SALPINGECTOMY Bilateral 10/16/2013   Procedure: BILATERAL SALPINGECTOMY;  Surgeon: Lovenia Kim, MD;  Location: [facility];  Service: Gynecology;  Laterality: Bilateral;   BREAST REDUCTION SURGERY Bilateral 10/03/2018   Procedure: BILATERAL BREAST  REDUCTION;  Surgeon: Wallace Going, DO;  Location: [place];  Service: Plastics;  Laterality: Bilateral;   CESAREAN SECTION     x2   CHOLECYSTECTOMY     DIAGNOSTIC LAPAROSCOPY     MANDIBLE SURGERY     REMOVAL OF URINARY SLING N/A 01/09/2018   Procedure: REMOVAL OF MESH URINARY SLING EXTRUSION  CYSTOSTOPY;  Surgeon: Bjorn Loser, MD;  Location: [facility];  Service: Urology;  Laterality: N/A;   ROBOTIC ASSISTED TOTAL HYSTERECTOMY N/A 10/16/2013   Procedure: ROBOTIC ASSISTED TOTAL HYSTERECTOMY;  Surgeon: Lovenia Kim, MD;  Location: [facility];  Service: Gynecology;  Laterality: N/A;   Family History:  Family History  Problem Relation Age of Onset   Heart attack Mother    Hypertension Mother    Depression Mother    Neuropathy Father    Diabetes Father    Cancer Paternal Grandfather    Stroke Maternal Grandmother    Alcohol abuse Maternal Grandfather    Breast cancer Neg Hx    Family Psychiatric  History: not noted Social History:  Social History   Substance and Sexual Activity  Alcohol Use Yes   Comment: socially, once per month     Social History   Substance and Sexual Activity  Drug Use Yes   Types: Marijuana   Comment: prn when pain gets bad    Social History   Socioeconomic History   Marital status: Married    Spouse name: Not on file   Number of children: Not on file   Years of education: Not on file   Highest education level: Not on file  Occupational History   Not on file  Tobacco Use   Smoking status: Never   Smokeless tobacco: Never  Vaping Use   Vaping Use: Never used  Substance and Sexual Activity   Alcohol use: Yes    Comment: socially, once per month   Drug use: Yes    Types: Marijuana    Comment: prn when pain gets bad   Sexual activity: Yes    Birth control/protection: None, Surgical  Other Topics Concern   Not on file  Social History Narrative   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  She lives at home with husband and two  children.   Highest level of education:  Some college      Husband emotionally   Social Determinants of Health   Financial Resource Strain: Low Risk  (08/26/2019)   Overall Financial Resource Strain (CARDIA)    Difficulty of Paying Living Expenses: Not very hard  Food Insecurity: Food Insecurity Present (08/26/2019)   Hunger Vital Sign    Worried About Running Out of Food in the Last Year: Sometimes true    Ran Out of Food in the Last Year: Sometimes true  Transportation Needs: No Transportation Needs (08/26/2019)   PRAPARE - Hydrologist (Medical): No    Lack of Transportation (Non-Medical): No  Physical Activity: Inactive (08/26/2019)   Exercise Vital Sign    Days of Exercise per Week: 0 days    Minutes of Exercise per Session: 0 min  Stress: Stress Concern Present (08/26/2019)   [place]  Feeling of Stress : Very much  Social Connections: Unknown (08/26/2019)   Social Connection and Isolation Panel [NHANES]    Frequency of Communication with Friends and Family: Not on file    Frequency of Social Gatherings with Friends and Family: Not on file    Attends Religious Services: 1 to 4 times per year    Active Member of [employer] or Organizations: Yes    Attends Archivist Meetings: 1 to 4 times per year    Marital Status: Married   Additional Social History:    Allergies:   Allergies  Allergen Reactions   Mirabegron Swelling    Lower extremities    Sulfa Antibiotics Hives    All over body   Elavil [Amitriptyline Hcl] Anxiety   Lyrica [Pregabalin] Swelling and Rash    Lower extremities     Labs:  Results for orders placed or performed during the hospital encounter of 06/19/22 (from the past 48 hour(s))  Comprehensive metabolic panel     Status: Abnormal   Collection Time: 06/19/22  3:12 PM  Result Value Ref Range   Sodium 142 135 - 145 mmol/L   Potassium 4.2 3.5 - 5.1  mmol/L   Chloride 110 98 - 111 mmol/L   CO2 26 22 - 32 mmol/L   Glucose, Bld 91 70 - 99 mg/dL    Comment: Glucose reference range applies only to samples taken after fasting for at least 8 hours.   BUN 20 6 - 20 mg/dL   Creatinine, Ser 1.05 (H) 0.44 - 1.00 mg/dL   Calcium 9.9 8.9 - 10.3 mg/dL   Total Protein 7.2 6.5 - 8.1 g/dL   Albumin 4.1 3.5 - 5.0 g/dL   AST 18 15 - 41 U/L   ALT 19 0 - 44 U/L   Alkaline Phosphatase 89 38 - 126 U/L   Total Bilirubin 1.0 0.3 - 1.2 mg/dL   GFR, Estimated >60 >60 mL/min    Comment: (NOTE) Calculated using the CKD-EPI Creatinine Equation (2021)    Anion gap 6 5 - 15    Comment: Performed at [facility], [place] [address], [place], [place] [address]  Ethanol     Status: None   Collection Time: 06/19/22  3:12 PM  Result Value Ref Range   Alcohol, Ethyl (B) <10 <10 mg/dL    Comment: (NOTE) Lowest detectable limit for serum alcohol is 10 mg/dL.  For medical purposes only. Performed at [facility], [place] [address], [place], [place] [address]   Urine rapid drug screen (hosp performed)     Status: Abnormal   Collection Time: 06/19/22  3:12 PM  Result Value Ref Range   Opiates NONE DETECTED NONE DETECTED   Cocaine NONE DETECTED NONE DETECTED   Benzodiazepines POSITIVE (A) NONE DETECTED   Amphetamines POSITIVE (A) NONE DETECTED   Tetrahydrocannabinol POSITIVE (A) NONE DETECTED   Barbiturates NONE DETECTED NONE DETECTED    Comment: (NOTE) DRUG SCREEN FOR MEDICAL PURPOSES ONLY.  IF CONFIRMATION IS NEEDED FOR ANY PURPOSE, NOTIFY LAB WITHIN 5 DAYS.  LOWEST DETECTABLE LIMITS FOR URINE DRUG SCREEN Drug Class                     Cutoff (ng/mL) Amphetamine and metabolites    1000 Barbiturate and metabolites    200 Benzodiazepine                 962 Tricyclics and metabolites     300 Opiates and metabolites  300 Cocaine and metabolites        300 THC                            50 Performed at [facility], [place] [address] [place], [place] [address]   CBC with Diff     Status: None   Collection Time: 06/19/22  3:12 PM  Result Value Ref Range   WBC 7.2 4.0 - 10.5 K/uL   RBC 4.95 3.87 - 5.11 MIL/uL   Hemoglobin 15.0 12.0 - 15.0 g/dL   HCT 45.7 36.0 - 46.0 %   MCV 92.3 80.0 - 100.0 fL   MCH 30.3 26.0 - 34.0 pg   MCHC 32.8 30.0 - 36.0 g/dL   RDW 12.8 11.5 - 15.5 %   Platelets 287 150 - 400 K/uL   nRBC 0.0 0.0 - 0.2 %   Neutrophils Relative % 55 %   Neutro Abs 3.9 1.7 - 7.7 K/uL   Lymphocytes Relative 35 %   Lymphs Abs 2.6 0.7 - 4.0 K/uL   Monocytes Relative 7 %   Monocytes Absolute 0.5 0.1 - 1.0 K/uL   Eosinophils Relative 2 %   Eosinophils Absolute 0.2 0.0 - 0.5 K/uL   Basophils Relative 1 %   Basophils Absolute 0.1 0.0 - 0.1 K/uL   Immature Granulocytes 0 %   Abs Immature Granulocytes 0.02 0.00 - 0.07 K/uL    Comment: Performed at [facility], [place] [address], [place], [place] [address]    Medications:  No current facility-administered medications for this encounter.   Current Outpatient Medications  Medication Sig Dispense Refill   ALPRAZolam (XANAX) 1 MG tablet Take 1 mg by mouth 3 (three) times daily as needed.     buPROPion (WELLBUTRIN XL) 150 MG 24 hr tablet Take 150 mg by mouth every morning.     DULoxetine (CYMBALTA) 60 MG capsule Take 60 mg by mouth daily.      guaiFENesin 200 MG tablet Take 1 tablet (200 mg total) by mouth every 4 (four) hours as needed for cough or to loosen phlegm. 30 suppository 0   ondansetron (ZOFRAN) 4 MG tablet Take 1 tablet (4 mg total) by mouth every 8 (eight) hours as needed for nausea or vomiting. 10 tablet 0   predniSONE (STERAPRED UNI-PAK 21 TAB) 10 MG (21) TBPK tablet Take by mouth daily. Take 6 tabs by mouth daily  for 2 days, then 5 tabs for 2 days, then 4 tabs for 2 days, then 3 tabs for 2 days, 2 tabs for 2 days, then 1 tab by mouth daily for 2 days 42 tablet 0   promethazine-dextromethorphan  (PROMETHAZINE-DM) 6.25-15 MG/5ML syrup Take 5 mLs by mouth 4 (four) times daily as needed for cough. 180 mL 0   SAXENDA 18 MG/3ML SOPN Inject 1.8 mg into the skin daily.  1   tamsulosin (FLOMAX) 0.4 MG CAPS capsule Take 1 capsule (0.4 mg total) by mouth daily. 30 capsule 0   VYVANSE 70 MG capsule Take 70 mg by mouth every morning.     WEGOVY 0.5 MG/0.5ML SOAJ SMARTSIG:0.5 Milliliter(s) SUB-Q Once a Week      Musculoskeletal: Strength & Muscle Tone: decreased Gait & Station: normal Patient leans: N/A  Psychiatric Specialty Exam:  Presentation  General Appearance: Casual Eye Contact:Fair Speech:Clear and Coherent Speech Volume:Normal Handedness:No data recorded  Mood and Affect  Mood:Dysphoric; Labile Affect:Non-Congruent; Labile  Thought Process  Thought Processes:Goal Directed Descriptions of Associations:Tangential  Orientation:Full (Time, Place and Person)  Thought Content:Illogical; Tangential; Scattered  History of Schizophrenia/Schizoaffective disorder:No data recorded Duration of Psychotic Symptoms:No data recorded Hallucinations:Hallucinations: None  Ideas of Reference:None  Suicidal Thoughts:Suicidal Thoughts: No  Homicidal Thoughts:Homicidal Thoughts: No   Sensorium  Memory:Immediate Fair; Recent Fair; Remote Fair Judgment:Poor Insight:Shallow  Executive Functions  Concentration:Fair Attention Span:Fair [place]  Psychomotor Activity  Psychomotor Activity:Psychomotor Activity: Normal  Assets  Assets:Resilience  Sleep  Sleep:Sleep: Fair   Physical Exam: Physical Exam Vitals and nursing note reviewed.  Constitutional:      Appearance: She is obese. She is not ill-appearing.  HENT:     Head: Normocephalic.     Nose: Nose normal.     Mouth/Throat:     Mouth: Mucous membranes are moist.     Pharynx: Oropharynx is clear.  Eyes:     Pupils: Pupils are equal, round, and reactive to light.   Cardiovascular:     Rate and Rhythm: Normal rate.     Pulses: Normal pulses.  Pulmonary:     Effort: Pulmonary effort is normal.  Abdominal:     Palpations: Abdomen is soft.  Musculoskeletal:        General: Normal range of motion.     Cervical back: Normal range of motion.  Skin:    General: Skin is warm and dry.  Neurological:     Mental Status: She is alert and oriented to person, place, and time.  Psychiatric:        Attention and Perception: She is attentive. She does not perceive auditory or visual hallucinations.        Mood and Affect: Affect is labile and blunt.        Speech: Speech is rapid and pressured and tangential.        Behavior: Behavior is agitated.        Thought Content: Thought content does not include homicidal ideation. Thought content does not include homicidal or suicidal plan.        Cognition and Memory: Cognition and memory normal.        Judgment: Judgment is impulsive.    Review of Systems  Psychiatric/Behavioral:  Positive for depression, substance abuse and suicidal ideas. The patient has insomnia.   All other systems reviewed and are negative.  Blood pressure (!) 127/101, pulse (!) 115, temperature 97.8 F (36.6 C), temperature source Oral, resp. rate 18, last menstrual period 10/05/2013, SpO2 99 %. There is no height or weight on file to calculate BMI.  Treatment Plan Summary: Daily contact with patient to assess and evaluate symptoms and progress in treatment, Medication management, and Plan seek inpatient hospitalization. Duloxetine 60 mg daily, Clonazepam '1mg'$  BID, Buspirone 7.'5mg'$  BID restarted per chart review.    Disposition: Recommend psychiatric Inpatient admission when medically cleared. Supportive therapy provided about ongoing stressors. Discussed crisis plan, support from social network, calling 911, coming to the Emergency Department, and calling Suicide Hotline.  This service was provided via telemedicine using a 2-way,  interactive audio and video technology.  Names of all persons participating in this telemedicine service and their role in this encounter. Name: Oneida Alar Role: PMHNP  Name: Joycelyn Schmid Cinderella Role: Attending MD  Name: Neysa Bonito Role: patient  Name:  Role:     Inda Merlin, NP 06/19/2022 4:45 PM

## 2022-06-19 NOTE — ED Notes (Signed)
Patient refusing all scheduled and PRN medications. Advised to stay seated in her area / bed and that she would be getting a dinner tray. Appears paranoid and anxious but redirectable.

## 2022-06-20 NOTE — ED Notes (Signed)
Report attempted to [facility] x2 with no answer.

## 2022-06-20 NOTE — ED Notes (Signed)
Attempted to call report to [facility] x3 with no answer.

## 2022-06-20 NOTE — Progress Notes (Signed)
Inpatient Behavioral Health Placement  At the direction of Dr. Dwyane Dee pt is to be faxed out. There are no available beds at [facility] per [facility] Lynnda Shields, RN. Referral was sent to the following facilities;   Destination Service Provider Address Phone Fax  [facility]  [address] [place] [address] [phone]  [place] [address] [place] [address] [phone]  [facility]  [address] [place] [address] [place]  [facility]  [address], [place] [address] [phone]  [place]  [place], [place] [address] [phone]  [facility]  [address] [place], [place] [address] [phone] [place] [address] [place] [address] [place]  [facility]  [address] [place] [address] [phone]  [facility]  [address], [place] [address] [phone]  [facility]  [address] [place] [address] [phone] [place], [place] [address] [phone] [facility]  [address] [place] [address] [phone]  [facility]  [address] [place] [address] [phone] [facility]  [address], [place] [address] [phone]  [facility]  [place], [place] [address] [place]  [facility]  [address] [place] [address] [phone] [facility]  [place],  [place] [address] [phone]  [facility]  [place] [address] [place] [address] [phone]  [facility]  [address] [place] [address] [phone]  [facility]  [address], [place] [address] [phone] [facility]  [address] [place] [address] [phone]    Situation ongoing,  CSW will follow up.   Benjaman Kindler, MSW, LCSWA 06/20/2022  @ 12:19 PM

## 2022-06-20 NOTE — ED Notes (Signed)
Report given to [facility], Therapist, sports at [facility].

## 2022-06-20 NOTE — ED Notes (Addendum)
Patient reports vomiting in the bathroom just now. Patient will not elaborate if this episode of emesis was intentional or not or whether she visualized any pills in her emesis.

## 2022-06-20 NOTE — ED Notes (Signed)
Dinner tray provided

## 2022-06-20 NOTE — ED Notes (Signed)
Patient refusing all medications except her Cymbalta at this time. Patient stating "I will not take any antipsychotics. You better tell everyone to stop offering them to me. If I die, it's on your hands. I don't take anything that my psychiatrist hasn't prescribed for me." Explained to patient that medications were ordered by psychiatry here and that we are not attempting any harm towards her. Patient agitated at time. MD aware.

## 2022-06-20 NOTE — ED Notes (Addendum)
Pt requesting meds, advised that NP is currently reevaluating her meds in order to honor pt's request (not to receive antipsychotics and other meds she refused this morning believing they will increase her agitation.) Pt responded "no, fuck the pills, give me the strongest stuff you got or else I'll run out the door or need to be tied down." ... "Y'all better get ready for what's about to happen when these meds get in my system." Pt immediately called family, saying "look, I told you they just wanted to drug me up and restrain me."

## 2022-06-20 NOTE — Consult Note (Signed)
  Accepted for admission at [facility], nobody to take report from our Nursing staff.  Called 7 times.  Will continue to try.

## 2022-06-20 NOTE — ED Notes (Addendum)
Unable to reach sheriff's dept transportation, contact attempted x3. Message left at answering machine with call-back number.

## 2022-06-20 NOTE — ED Notes (Addendum)
Attempted to call report to [facility] x4 with no answer. Social work and psych NP aware.

## 2022-06-20 NOTE — ED Notes (Addendum)
Report attempted to [facility] x7 with no response. Social work and pysch NP aware.

## 2022-06-20 NOTE — Progress Notes (Addendum)
Pt was accepted To [facility] 06/20/22; [facility] unit  Pt meets inpatient criteria per Dr. Dwyane Dee  Attending Physician will be Dr. Jonna Coup  Report can be called to: [phone]  Pt can arrive after 2:00pm  Care Team notified: Ruben Im, RN, and Charmaine Downs, NP   Nadara Mode, Rebecca 06/20/2022 @ 2:05 PM

## 2022-06-20 NOTE — ED Notes (Signed)
Pt given lunch tray.

## 2022-06-20 NOTE — ED Notes (Signed)
Pt is to go to [facility] in the morning after 0900.

## 2022-06-20 NOTE — ED Notes (Signed)
RN attempted to call sheriff office to transport pt to [facility]. RN will retry in an hour.

## 2022-06-20 NOTE — ED Notes (Addendum)
Pt is increasingly agitated when informed of plan for inpt placement. Pacing around in hall. She is demanding meds previously refused because "antipsychotics make me freak the fuck out and you're all gonna be sorry, this will teach y'all a lesson." She is upset about being in hall bed but reportedly refused when offered a room earlier. States she is tired but stays in chair and refuses to use bed "because it's dirty" despite numerous offers from staff to sanitize bed and change sheets for her. Pt given cell phone to retrieve contacts (returned to pt belongings cabinet for [place] E). Pt appears to be arguing and was reminded that we may restrict phone use if it contributes to agitation or unsafe behaviors.

## 2022-06-20 NOTE — ED Notes (Signed)
Attempted to call report to [facility] x5, but the call dropped.

## 2022-06-20 NOTE — ED Notes (Signed)
Patient states "Give me all the medication that they've ordered. I am ready to give you guys hell and actually go crazy if yall are going to admit me somewhere."

## 2022-06-20 NOTE — ED Notes (Addendum)
Per Reginold Agent NP, morning Klonopin and Risperdal given d/t patient being willing to take medications at this time.

## 2022-06-20 NOTE — ED Provider Notes (Signed)
Emergency Medicine Observation Re-evaluation Note  [name] is a 51 y.o. female, seen on rounds today.  Pt initially presented to the ED for complaints of Psychiatric Evaluation Currently, the patient is awake and alert.  Pt said she would never hurt herself.  She and her husband have been having "marital problems."  She has a gun because she shoots at her dad's property to relax.  However, she has surrendered the gun to a friend because she was "emotional."  She does not want any of her meds or prn meds because she wants to be alert for her psych interview.  She did not sleep last night, but she said she has trouble sleeping normally and it's impossible to sleep in the hallway of a busy ED.  Physical Exam  BP 133/83 (BP Location: Left Arm)   Pulse 89   Temp 98.1 F (36.7 C) (Oral)   Resp 19   LMP 10/05/2013   SpO2 99%  Physical Exam General: awake and alert Cardiac: rr Lungs: clear Psych: calm  ED Course / MDM  EKG:EKG Interpretation  Date/Time:  Sunday June 19 2022 15:43:51 EDT Ventricular Rate:  118 PR Interval:  132 QRS Duration: 89 QT Interval:  328 QTC Calculation: 460 R Axis:   48 Text Interpretation: Sinus tachycardia Anterior infarct, old Confirmed by Ripley Fraise [id number]) on 06/20/2022 8:04:06 AM  I have reviewed the labs performed to date as well as medications administered while in observation.  Recent changes in the last 24 hours include psych has recommended inpatient placement.  Pt is currently denying si.  Plan  Current plan is for inpatient placement.    Isla [place], MD 06/20/22 [phone]

## 2022-06-20 NOTE — ED Notes (Signed)
Attempted to call [facility] office for transport. RN received no answer.

## 2022-06-20 NOTE — ED Notes (Signed)
Patient given phone to notify work of situation.

## 2022-06-20 NOTE — ED Notes (Signed)
Patient currently resting in a chair. Appears to be sleeping. Even rise and fall of the chest noted. No distress noted.

## 2022-06-21 MED ORDER — STERILE WATER FOR INJECTION IJ SOLN
INTRAMUSCULAR | Status: AC
  Administered 2022-06-21: 10 mL
  Filled 2022-06-21: qty 10

## 2022-06-21 NOTE — ED Notes (Signed)
[name] updated with ETA. [facility] notified pt will be transported tomorrow after 0900.

## 2022-06-21 NOTE — ED Notes (Signed)
[facility] at bedside.

## 2022-06-21 NOTE — ED Notes (Signed)
[facility] called with updates on arrival.

## 2022-06-21 NOTE — ED Notes (Signed)
Pt states that she will knock all this shit over. Pt reports being agitated and is pacing the hall.

## 2022-06-21 NOTE — ED Notes (Signed)
RN attempted to call [facility] Department for transportation. ED number left on voicemail for a call back.

## 2022-06-21 NOTE — ED Notes (Signed)
Attempted to call and update [facility] pt in [place]. After multiple transfers and holds, unable to reach a person. Answering service kept transferring and hanging up.

## 2022-06-21 NOTE — ED Provider Notes (Signed)
Depression emergency Medicine Observation Re-evaluation Note  [name] [place] is a 51 y.o. female, seen on rounds today.  Pt initially presented to the ED for complaints of Psychiatric Evaluation Currently, the patient is depression and suicidal thoughts  Physical Exam  BP (!) 113/93 (BP Location: Right Arm)   Pulse (!) 104   Temp 97.8 F (36.6 C) (Oral)   Resp 18   LMP 10/05/2013   SpO2 96%  Physical Exam Alert in no acute distress  ED Course / MDM  EKG:EKG Interpretation  Date/Time:  Sunday June 19 2022 15:43:51 EDT Ventricular Rate:  118 PR Interval:  132 QRS Duration: 89 QT Interval:  328 QTC Calculation: 460 R Axis:   48 Text Interpretation: Sinus tachycardia Anterior infarct, old Confirmed by Ripley Fraise [phone]) on 06/20/2022 8:04:06 AM  I have reviewed the labs performed to date as well as medications administered while in observation.  Recent changes in the last 24 hours include none.  Plan  Current plan is for inpatient to psych for depression and suicidal.    Milton Ferguson, MD 06/21/22 [phone]

## 2022-06-21 NOTE — ED Notes (Signed)
Spoke with [facility], they will be here to transport pt to [facility] a little after 8am.

## 2022-06-21 NOTE — Discharge Instructions (Signed)
Patient to go to [facility] directly

## 2022-09-19 IMAGING — CT CT ABD-PELV W/ CM
2 of 5 series · 15 of 46 positions shown, 17 images · IV contrast (Omnipaque)
Comparison: January 01, 2020

CLINICAL DATA: Lower abdominal pain x4 days.

EXAM:
CT ABDOMEN AND PELVIS WITH CONTRAST
TECHNIQUE: Multidetector CT imaging of the abdomen and pelvis was performed
using the standard protocol following bolus administration of
intravenous contrast.
CONTRAST:  100mL OMNIPAQUE IOHEXOL 300 MG/ML  SOLN

[Series 2: axial st · axial · 0.70mm/px · z∈[-427,+23]mm · 12 of 100 slices shown, 14 images]
[im 5/100  soft-tissue]
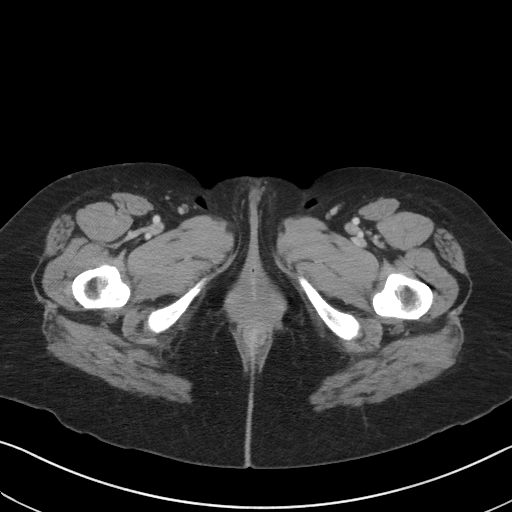
[im 5/100  bone]
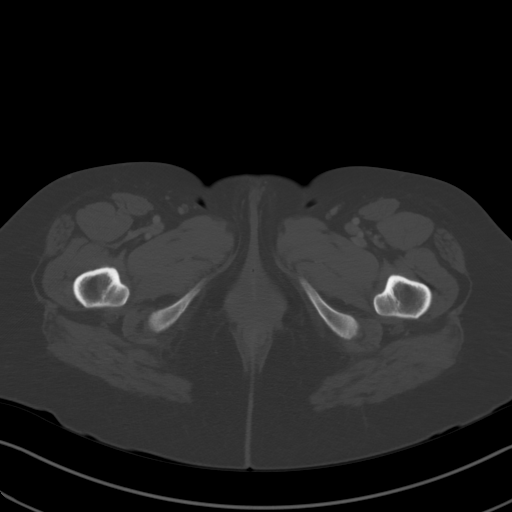
[im 15/100  soft-tissue]
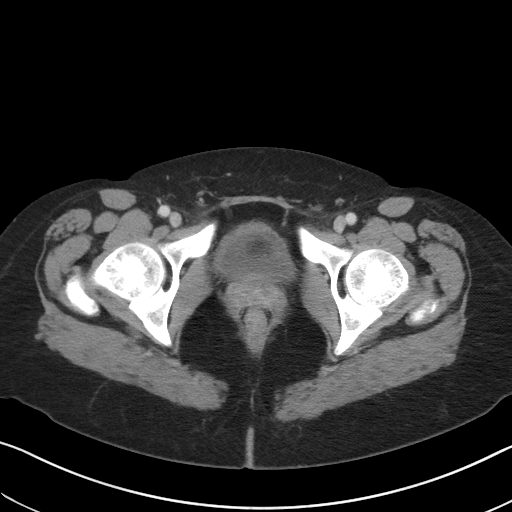
[im 20/100  soft-tissue]
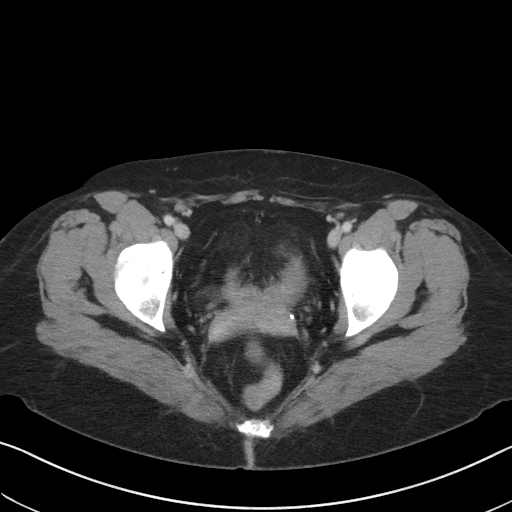
[im 30/100  soft-tissue]
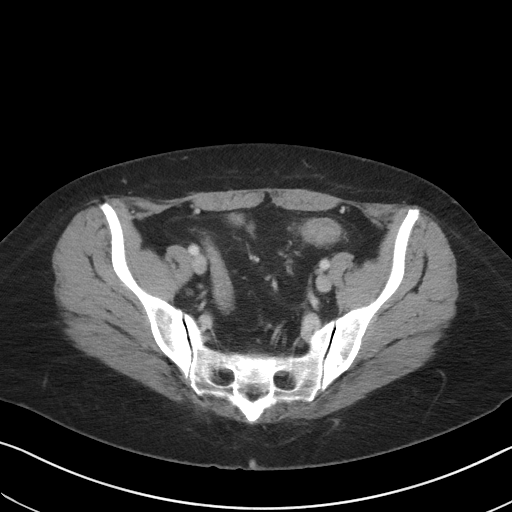
[im 40/100  soft-tissue]
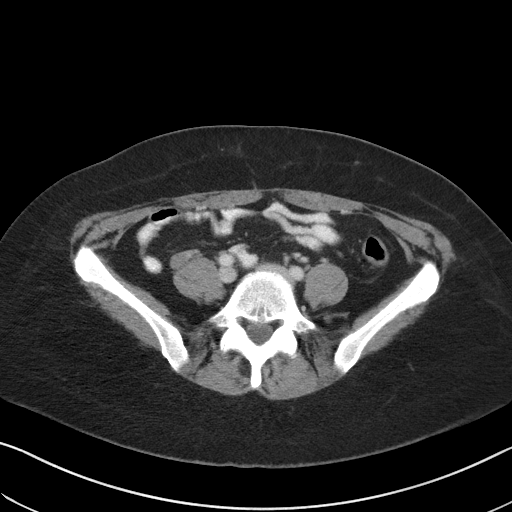
[im 45/100  soft-tissue]
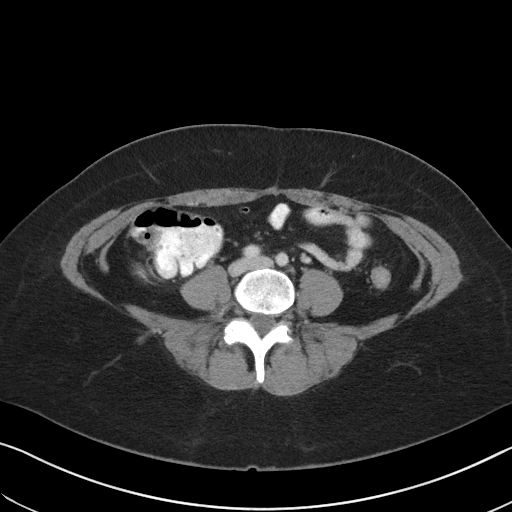
[im 55/100  soft-tissue]
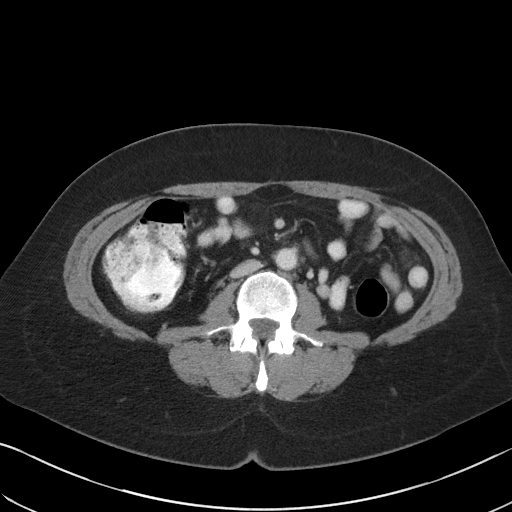
[im 60/100  soft-tissue]
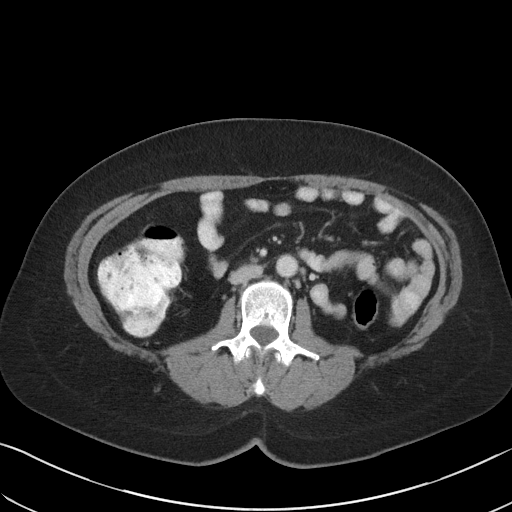
[im 70/100  soft-tissue]
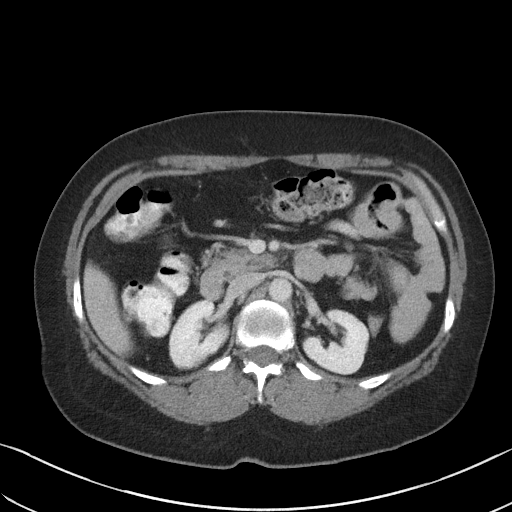
[im 70/100  bone]
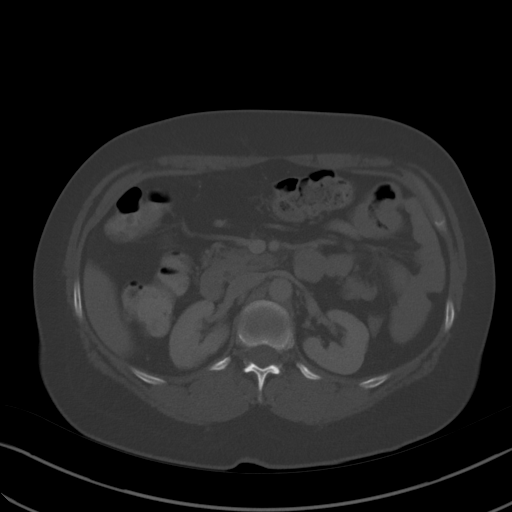
[im 80/100  soft-tissue]
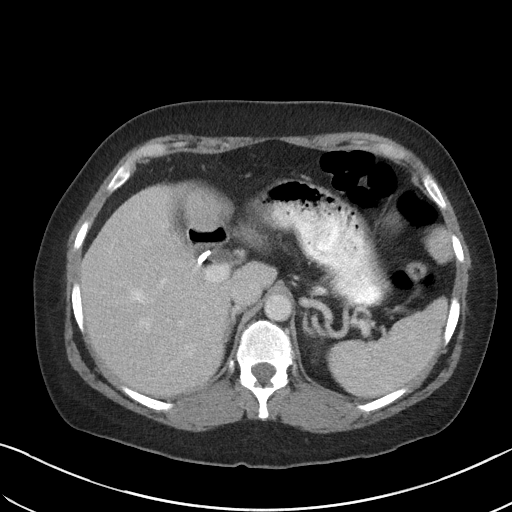
[im 85/100  soft-tissue]
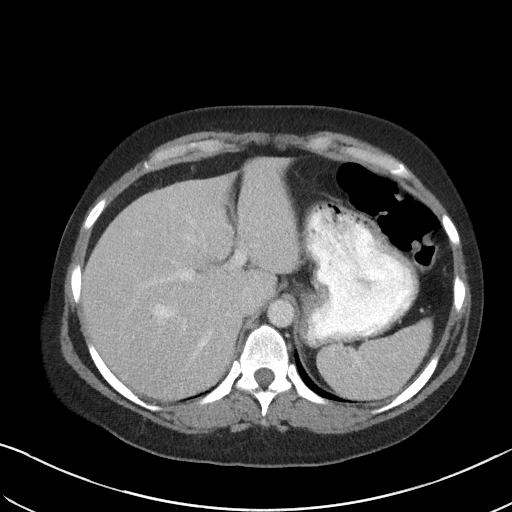
[im 95/100  soft-tissue]
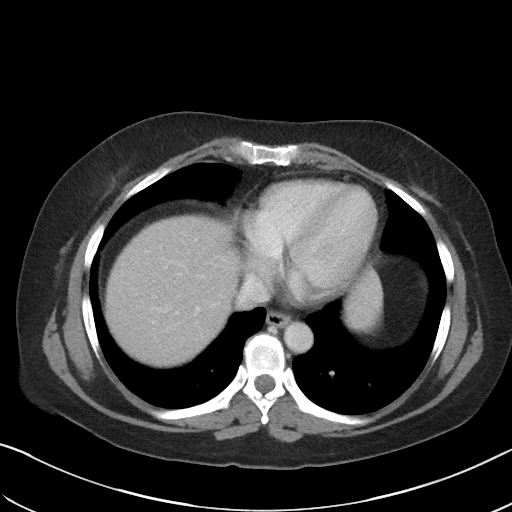

[Series 5: coronal st · coronal · 0.66mm/px · 3 of 93 slices shown]
[im 31/93  soft-tissue]
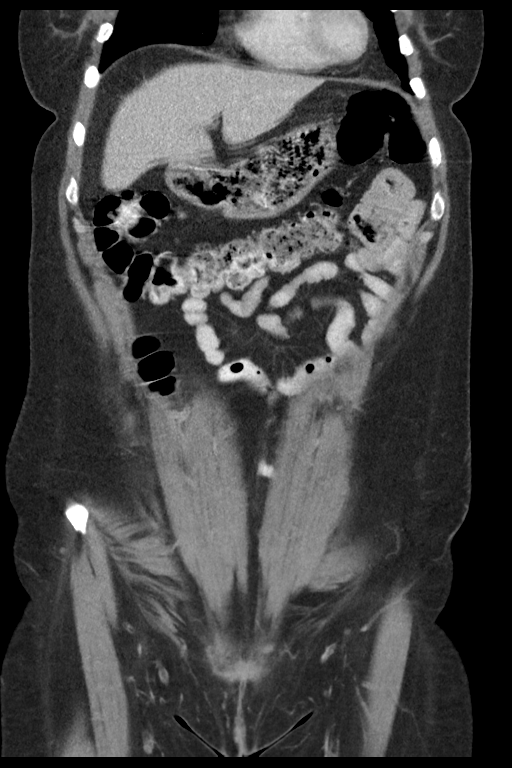
[im 41/93  soft-tissue]
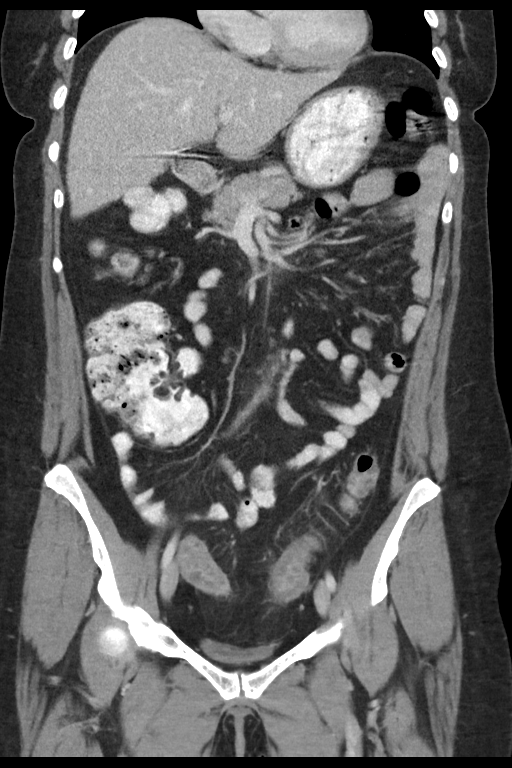
[im 52/93  soft-tissue]
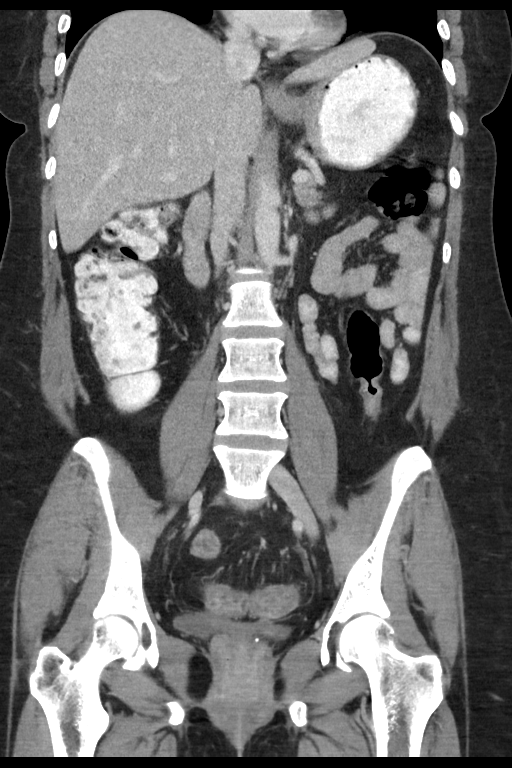

[15 of 46 positions shown; findings below may reference images not displayed]

FINDINGS: Lower chest: No acute abnormality.

Hepatobiliary: No focal liver abnormality is seen. Status post
cholecystectomy. No biliary dilatation.

Pancreas: Unremarkable. No pancreatic ductal dilatation or
surrounding inflammatory changes.

Spleen: Normal in size without focal abnormality.

Adrenals/Urinary Tract: Adrenal glands are unremarkable. Kidneys are
normal, without renal calculi, focal lesion, or hydronephrosis. The
urinary bladder is partially contracted and subsequently limited in
evaluation.

Stomach/Bowel: Stomach is within normal limits. Appendix appears
normal. No evidence of bowel dilatation. Marked severity thickened
and inflamed proximal and mid sigmoid colon is seen. Associated
pericolonic inflammatory fat stranding is noted. There is no
evidence of associated perforation or abscess.

Vascular/Lymphatic: No significant vascular findings are present. No
enlarged abdominal or pelvic lymph nodes.

Reproductive: Status post hysterectomy. No adnexal masses.

Other: No abdominal wall hernia or abnormality. No abdominopelvic
ascites.

Musculoskeletal: No acute or significant osseous findings.
IMPRESSION: 1. Marked severity colitis involving the proximal and mid sigmoid
colon.
2. Evidence of prior cholecystectomy and hysterectomy.

## 2022-10-04 ENCOUNTER — Other Ambulatory Visit: Payer: Self-pay | Admitting: Internal Medicine

## 2022-10-04 ENCOUNTER — Other Ambulatory Visit: Payer: 59

## 2022-10-04 ENCOUNTER — Inpatient Hospital Stay: Admission: RE | Admit: 2022-10-04 | Payer: 59 | Source: Ambulatory Visit

## 2022-10-04 DIAGNOSIS — R519 Headache, unspecified: Secondary | ICD-10-CM

## 2022-10-04 DIAGNOSIS — S0990XS Unspecified injury of head, sequela: Secondary | ICD-10-CM

## 2022-10-04 DIAGNOSIS — R110 Nausea: Secondary | ICD-10-CM

## 2022-10-04 DIAGNOSIS — H539 Unspecified visual disturbance: Secondary | ICD-10-CM

## 2022-10-05 ENCOUNTER — Ambulatory Visit (HOSPITAL_COMMUNITY)
Admission: RE | Admit: 2022-10-05 | Discharge: 2022-10-05 | Disposition: A | Discharge status: Home / Self Care | Payer: 59 | Source: Ambulatory Visit | Attending: Internal Medicine | Admitting: Internal Medicine

## 2022-10-05 DIAGNOSIS — R519 Headache, unspecified: Secondary | ICD-10-CM | POA: Diagnosis not present

## 2022-10-05 DIAGNOSIS — S0990XS Unspecified injury of head, sequela: Secondary | ICD-10-CM | POA: Diagnosis present

## 2022-10-05 DIAGNOSIS — H539 Unspecified visual disturbance: Secondary | ICD-10-CM | POA: Diagnosis present

## 2022-10-05 DIAGNOSIS — R110 Nausea: Secondary | ICD-10-CM | POA: Insufficient documentation

## 2023-02-14 DIAGNOSIS — Z1212 Encounter for screening for malignant neoplasm of rectum: Secondary | ICD-10-CM | POA: Diagnosis not present

## 2023-05-02 ENCOUNTER — Other Ambulatory Visit: Payer: Self-pay | Admitting: *Deleted

## 2023-05-02 DIAGNOSIS — M79605 Pain in left leg: Secondary | ICD-10-CM

## 2023-05-19 DIAGNOSIS — F332 Major depressive disorder, recurrent severe without psychotic features: Secondary | ICD-10-CM | POA: Diagnosis not present

## 2023-05-19 DIAGNOSIS — F1020 Alcohol dependence, uncomplicated: Secondary | ICD-10-CM | POA: Diagnosis not present

## 2023-05-23 ENCOUNTER — Encounter: Payer: Self-pay | Admitting: Vascular Surgery

## 2023-05-23 ENCOUNTER — Ambulatory Visit (INDEPENDENT_AMBULATORY_CARE_PROVIDER_SITE_OTHER): Payer: Medicare Other | Admitting: Vascular Surgery

## 2023-05-23 ENCOUNTER — Ambulatory Visit (HOSPITAL_COMMUNITY)
Admission: RE | Admit: 2023-05-23 | Discharge: 2023-05-23 | Disposition: A | Discharge status: Home / Self Care | Payer: Medicare Other | Source: Ambulatory Visit | Attending: Vascular Surgery | Admitting: Vascular Surgery

## 2023-05-23 VITALS — BP 110/81 | HR 94 | Temp 97.9°F | Resp 20 | Ht 63.0 in | Wt 178.0 lb

## 2023-05-23 DIAGNOSIS — M79605 Pain in left leg: Secondary | ICD-10-CM | POA: Diagnosis present

## 2023-05-23 DIAGNOSIS — I872 Venous insufficiency (chronic) (peripheral): Secondary | ICD-10-CM

## 2023-05-23 DIAGNOSIS — I8393 Asymptomatic varicose veins of bilateral lower extremities: Secondary | ICD-10-CM

## 2023-05-23 DIAGNOSIS — M79604 Pain in right leg: Secondary | ICD-10-CM | POA: Diagnosis present

## 2023-05-24 NOTE — Progress Notes (Signed)
[facility] [place]  ASSESSMENT / PLAN: [name] is a 52 y.o. female with chronic venous insufficiency of right lower extremity causing swelling (C3 disease).  Venous duplex is significant for saphenous vein reflux. This appears amenable to endovenous ablation. Recommend compression and elevation for symptomatic relief. Follow up in three months to discuss saphenous vein ablation.  CHIEF COMPLAINT: left leg swelling  HISTORY OF PRESENT ILLNESS: [name] is a 52 y.o. female furred to clinic for evaluation of venous varicosities.  The patient has a symptomatic varicosity of the right lower extremity, causing pain and tenderness around its distribution.  She also notes swelling about the ankle which worsens over the course of the day.  We reviewed her duplex findings in detail.  Past Medical History:  Diagnosis Date   ADD (attention deficit disorder)    Anxiety    Arthropathy, unspecified, site unspecified    Chronic fatigue syndrome    Depression    Disc degeneration    Fibromyalgia    History of kidney stones    last stone in 2010   Migraines    Multilevel degenerative disc disease    cervical and lumbar, has had injections/epidural   Panic attack 2019   PTSD (post-traumatic stress disorder) 2019   Sleep disorder 01/08/2014   Sleep disturbance, unspecified    Symptomatic mammary hypertrophy 09/2018   Unspecified nonpsychotic mental disorder     Past Surgical History:  Procedure Laterality Date   ABDOMINAL HYSTERECTOMY     BILATERAL SALPINGECTOMY Bilateral 10/16/2013   Procedure: BILATERAL SALPINGECTOMY;  Surgeon: Lenoard Aden, MD;  Location: [facility];  Service: Gynecology;  Laterality: Bilateral;   BREAST REDUCTION SURGERY Bilateral 10/03/2018   Procedure: BILATERAL BREAST REDUCTION;  Surgeon: Peggye Form, DO;  Location: [facility];  Service: Plastics;  Laterality: Bilateral;   CESAREAN  SECTION     x2   CHOLECYSTECTOMY     DIAGNOSTIC LAPAROSCOPY     MANDIBLE SURGERY     REMOVAL OF URINARY SLING N/A 01/09/2018   Procedure: REMOVAL OF MESH URINARY SLING EXTRUSION  CYSTOSTOPY;  Surgeon: Alfredo Martinez, MD;  Location: [facility];  Service: Urology;  Laterality: N/A;   ROBOTIC ASSISTED TOTAL HYSTERECTOMY N/A 10/16/2013   Procedure: ROBOTIC ASSISTED TOTAL HYSTERECTOMY;  Surgeon: Lenoard Aden, MD;  Location: [facility];  Service: Gynecology;  Laterality: N/A;    Family History  Problem Relation Age of Onset   Heart attack Mother    Hypertension Mother    Depression Mother    Neuropathy Father    Diabetes Father    Cancer Paternal Grandfather    Stroke Maternal Grandmother    Alcohol abuse Maternal Grandfather    Breast cancer Neg Hx     Social History   Socioeconomic History   Marital status: Married    Spouse name: Not on file   Number of children: Not on file   Years of education: Not on file   Highest education level: Not on file  Occupational History   Not on file  Tobacco Use   Smoking status: Never   Smokeless tobacco: Never  Vaping Use   Vaping status: Never Used  Substance and Sexual Activity   Alcohol use: Yes    Comment: socially, once per month   Drug use: Yes    Types: Marijuana    Comment: prn when pain gets bad   Sexual activity: Yes    Birth control/protection: None, Surgical  Other Topics Concern   Not on file  Social History Narrative   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  She lives at home with husband and two children.   Highest level of education:  Some college      Husband emotionally   Social Determinants of Health   Financial Resource Strain: Low Risk  (08/26/2019)   Overall Financial Resource Strain (CARDIA)    Difficulty of Paying Living Expenses: Not very hard  Food Insecurity: No Food Insecurity (12/07/2020)   Received from [facility]   Hunger Vital Sign    Worried About Running Out of Food in the Last Year: Never true     Ran Out of Food in the Last Year: Never true  Transportation Needs: No Transportation Needs (08/26/2019)   PRAPARE - Administrator, Civil Service (Medical): No    Lack of Transportation (Non-Medical): No  Physical Activity: Inactive (08/26/2019)   Exercise Vital Sign    Days of Exercise per Week: 0 days    Minutes of Exercise per Session: 0 min  Stress: Stress Concern Present (08/26/2019)   [employer] of Occupational Health - Occupational Stress Questionnaire    Feeling of Stress : Very much  Social Connections: Unknown (02/28/2022)   Received from [facility]   Social Network    Social Network: Not on file  Intimate Partner Violence: Unknown (01/20/2022)   Received from [facility]   HITS    Physically Hurt: Not on file    Insult or Talk Down To: Not on file    Threaten Physical Harm: Not on file    Scream or Curse: Not on file    Allergies  Allergen Reactions   Mirabegron Swelling    Lower extremities    Sulfa Antibiotics Hives    All over body   Elavil [Amitriptyline Hcl] Anxiety   Iodinated Contrast Media Rash   Lyrica [Pregabalin] Swelling and Rash    Lower extremities     Current Outpatient Medications  Medication Sig Dispense Refill   ALPRAZolam (XANAX) 1 MG tablet 1 mg 3 (three) times daily.     AUVELITY 45-105 MG TBCR Take 1 tablet by mouth every morning.     naltrexone (DEPADE) 50 MG tablet Take 50 mg by mouth daily as needed.     traZODone (DESYREL) 100 MG tablet Take 100 mg by mouth at bedtime.     No current facility-administered medications for this visit.    PHYSICAL EXAM Vitals:   05/23/23 1435  BP: 110/81  Pulse: 94  Resp: 20  Temp: 97.9 F (36.6 C)  SpO2: 97%  Weight: 178 lb (80.7 kg)  Height: 5\' 3"  (1.6 m)   Well appearing woman in no distress Regular rate and rhythm Unlabored breathing 2+ DP pulses Mild edema of ankle and calf  PERTINENT LABORATORY AND RADIOLOGIC DATA  Most recent CBC    Latest Ref Rng & Units  06/19/2022    3:12 PM 07/05/2021    2:26 PM 12/22/2017    2:12 PM  CBC  WBC 4.0 - 10.5 K/uL 7.2  10.1  7.1   Hemoglobin 12.0 - 15.0 g/dL 84.6  96.2  95.2   Hematocrit 36.0 - 46.0 % 45.7  44.0  45.1   Platelets 150 - 400 K/uL 287  315  297      Most recent CMP    Latest Ref Rng & Units 06/19/2022    3:12 PM 07/05/2021    2:26 PM 12/22/2017  2:12 PM  CMP  Glucose 70 - 99 mg/dL 91  161  87   BUN 6 - 20 mg/dL 20  13  12    Creatinine 0.44 - 1.00 mg/dL 0.96  0.45  4.09   Sodium 135 - 145 mmol/L 142  136  141   Potassium 3.5 - 5.1 mmol/L 4.2  3.9  4.0   Chloride 98 - 111 mmol/L 110  103  106   CO2 22 - 32 mmol/L 26  24  26    Calcium 8.9 - 10.3 mg/dL 9.9  9.2  9.4   Total Protein 6.5 - 8.1 g/dL 7.2  7.1    Total Bilirubin 0.3 - 1.2 mg/dL 1.0  0.4    Alkaline Phos 38 - 126 U/L 89  114    AST 15 - 41 U/L 18  16    ALT 0 - 44 U/L 19  17     Right lower extremity venous reflux study:  - No evidence of deep vein thrombosis seen in the right lower extremity,  from the common femoral through the popliteal veins.  - No evidence of superficial venous thrombosis in the right lower  extremity.     - No evidence of superficial venous reflux seen in the right short  saphenous vein.    - Venous reflux is noted in the right sapheno-femoral junction.  - Venous reflux is noted in the right greater saphenous vein in the thigh.    - Right popliteal fossa heterogeneous avascular structure 2.9cm x 0.5cm x  1.7cm. Sonographic findings consistent with joint effusion vs Baker's  cyst.   Rande Brunt. Lenell Antu, MD [facility] [employer] Phone Number: [phone] 05/24/2023 3:32 PM   Total time spent on preparing this encounter including chart review, data review, collecting history, examining the patient, coordinating care for this new patient, 45 minutes.  Portions of this report may have been transcribed using voice recognition software.  Every effort has been made to  ensure accuracy; however, inadvertent computerized transcription errors may still be present.

## 2023-06-21 DIAGNOSIS — S46012A Strain of muscle(s) and tendon(s) of the rotator cuff of left shoulder, initial encounter: Secondary | ICD-10-CM | POA: Diagnosis not present

## 2023-07-03 DIAGNOSIS — M6281 Muscle weakness (generalized): Secondary | ICD-10-CM | POA: Diagnosis not present

## 2023-07-03 DIAGNOSIS — M25512 Pain in left shoulder: Secondary | ICD-10-CM | POA: Diagnosis not present

## 2023-07-03 DIAGNOSIS — M25511 Pain in right shoulder: Secondary | ICD-10-CM | POA: Diagnosis not present

## 2023-07-03 DIAGNOSIS — M542 Cervicalgia: Secondary | ICD-10-CM | POA: Diagnosis not present

## 2023-07-07 DIAGNOSIS — F1020 Alcohol dependence, uncomplicated: Secondary | ICD-10-CM | POA: Diagnosis not present

## 2023-07-07 DIAGNOSIS — F332 Major depressive disorder, recurrent severe without psychotic features: Secondary | ICD-10-CM | POA: Diagnosis not present

## 2023-07-07 DIAGNOSIS — M25512 Pain in left shoulder: Secondary | ICD-10-CM | POA: Diagnosis not present

## 2023-07-10 DIAGNOSIS — M25511 Pain in right shoulder: Secondary | ICD-10-CM | POA: Diagnosis not present

## 2023-07-10 DIAGNOSIS — M542 Cervicalgia: Secondary | ICD-10-CM | POA: Diagnosis not present

## 2023-07-10 DIAGNOSIS — M6281 Muscle weakness (generalized): Secondary | ICD-10-CM | POA: Diagnosis not present

## 2023-07-10 DIAGNOSIS — M25512 Pain in left shoulder: Secondary | ICD-10-CM | POA: Diagnosis not present

## 2023-07-12 DIAGNOSIS — N760 Acute vaginitis: Secondary | ICD-10-CM | POA: Diagnosis not present

## 2023-07-12 DIAGNOSIS — R300 Dysuria: Secondary | ICD-10-CM | POA: Diagnosis not present

## 2023-07-17 DIAGNOSIS — M542 Cervicalgia: Secondary | ICD-10-CM | POA: Diagnosis not present

## 2023-07-17 DIAGNOSIS — M6281 Muscle weakness (generalized): Secondary | ICD-10-CM | POA: Diagnosis not present

## 2023-07-17 DIAGNOSIS — M25512 Pain in left shoulder: Secondary | ICD-10-CM | POA: Diagnosis not present

## 2023-07-17 DIAGNOSIS — M25511 Pain in right shoulder: Secondary | ICD-10-CM | POA: Diagnosis not present

## 2023-07-21 ENCOUNTER — Emergency Department (HOSPITAL_BASED_OUTPATIENT_CLINIC_OR_DEPARTMENT_OTHER): Payer: Medicare Other

## 2023-07-21 ENCOUNTER — Emergency Department (HOSPITAL_BASED_OUTPATIENT_CLINIC_OR_DEPARTMENT_OTHER)
Admission: EM | Admit: 2023-07-21 | Discharge: 2023-07-21 | Disposition: A | Discharge status: Home / Self Care | Payer: Medicare Other | Attending: Emergency Medicine | Admitting: Emergency Medicine

## 2023-07-21 ENCOUNTER — Other Ambulatory Visit: Payer: Self-pay

## 2023-07-21 ENCOUNTER — Encounter (HOSPITAL_BASED_OUTPATIENT_CLINIC_OR_DEPARTMENT_OTHER): Payer: Self-pay

## 2023-07-21 DIAGNOSIS — R140 Abdominal distension (gaseous): Secondary | ICD-10-CM | POA: Insufficient documentation

## 2023-07-21 DIAGNOSIS — Z9049 Acquired absence of other specified parts of digestive tract: Secondary | ICD-10-CM | POA: Diagnosis not present

## 2023-07-21 DIAGNOSIS — R1030 Lower abdominal pain, unspecified: Secondary | ICD-10-CM | POA: Diagnosis not present

## 2023-07-21 DIAGNOSIS — M25512 Pain in left shoulder: Secondary | ICD-10-CM | POA: Diagnosis not present

## 2023-07-21 DIAGNOSIS — F1020 Alcohol dependence, uncomplicated: Secondary | ICD-10-CM | POA: Diagnosis not present

## 2023-07-21 DIAGNOSIS — M542 Cervicalgia: Secondary | ICD-10-CM | POA: Diagnosis not present

## 2023-07-21 DIAGNOSIS — M25511 Pain in right shoulder: Secondary | ICD-10-CM | POA: Diagnosis not present

## 2023-07-21 DIAGNOSIS — M797 Fibromyalgia: Secondary | ICD-10-CM | POA: Insufficient documentation

## 2023-07-21 DIAGNOSIS — M6281 Muscle weakness (generalized): Secondary | ICD-10-CM | POA: Diagnosis not present

## 2023-07-21 DIAGNOSIS — R944 Abnormal results of kidney function studies: Secondary | ICD-10-CM | POA: Insufficient documentation

## 2023-07-21 DIAGNOSIS — N200 Calculus of kidney: Secondary | ICD-10-CM | POA: Insufficient documentation

## 2023-07-21 DIAGNOSIS — F332 Major depressive disorder, recurrent severe without psychotic features: Secondary | ICD-10-CM | POA: Diagnosis not present

## 2023-07-21 DIAGNOSIS — R1031 Right lower quadrant pain: Secondary | ICD-10-CM | POA: Insufficient documentation

## 2023-07-21 LAB — URINALYSIS, ROUTINE W REFLEX MICROSCOPIC
Bilirubin Urine: NEGATIVE
Glucose, UA: NEGATIVE mg/dL
Hgb urine dipstick: NEGATIVE
Ketones, ur: NEGATIVE mg/dL
Leukocytes,Ua: NEGATIVE
Nitrite: NEGATIVE
Protein, ur: NEGATIVE mg/dL
Specific Gravity, Urine: 1.006 (ref 1.005–1.030)
pH: 6 (ref 5.0–8.0)

## 2023-07-21 LAB — CBC WITH DIFFERENTIAL/PLATELET
Abs Immature Granulocytes: 0.02 10*3/uL (ref 0.00–0.07)
Basophils Absolute: 0 10*3/uL (ref 0.0–0.1)
Basophils Relative: 1 %
Eosinophils Absolute: 0.2 10*3/uL (ref 0.0–0.5)
Eosinophils Relative: 2 %
HCT: 42.7 % (ref 36.0–46.0)
Hemoglobin: 14.1 g/dL (ref 12.0–15.0)
Immature Granulocytes: 0 %
Lymphocytes Relative: 35 %
Lymphs Abs: 2.3 10*3/uL (ref 0.7–4.0)
MCH: 29.9 pg (ref 26.0–34.0)
MCHC: 33 g/dL (ref 30.0–36.0)
MCV: 90.5 fL (ref 80.0–100.0)
Monocytes Absolute: 0.5 10*3/uL (ref 0.1–1.0)
Monocytes Relative: 7 %
Neutro Abs: 3.6 10*3/uL (ref 1.7–7.7)
Neutrophils Relative %: 55 %
Platelets: 226 10*3/uL (ref 150–400)
RBC: 4.72 MIL/uL (ref 3.87–5.11)
RDW: 12.3 % (ref 11.5–15.5)
WBC: 6.6 10*3/uL (ref 4.0–10.5)
nRBC: 0 % (ref 0.0–0.2)

## 2023-07-21 LAB — COMPREHENSIVE METABOLIC PANEL
ALT: 14 U/L (ref 0–44)
AST: 16 U/L (ref 15–41)
Albumin: 4.3 g/dL (ref 3.5–5.0)
Alkaline Phosphatase: 79 U/L (ref 38–126)
Anion gap: 6 (ref 5–15)
BUN: 17 mg/dL (ref 6–20)
CO2: 29 mmol/L (ref 22–32)
Calcium: 9.6 mg/dL (ref 8.9–10.3)
Chloride: 104 mmol/L (ref 98–111)
Creatinine, Ser: 1.14 mg/dL — ABNORMAL HIGH (ref 0.44–1.00)
GFR, Estimated: 57 mL/min — ABNORMAL LOW (ref >60)
Glucose, Bld: 90 mg/dL (ref 70–99)
Potassium: 4.1 mmol/L (ref 3.5–5.1)
Sodium: 139 mmol/L (ref 135–145)
Total Bilirubin: 0.5 mg/dL (ref 0.3–1.2)
Total Protein: 6.8 g/dL (ref 6.5–8.1)

## 2023-07-21 LAB — LIPASE, BLOOD: Lipase: 21 U/L (ref 11–51)

## 2023-07-21 MED ORDER — SODIUM CHLORIDE 0.9 % IV BOLUS
1000.0000 mL | Freq: Once | INTRAVENOUS | Status: AC
  Administered 2023-07-21: 1000 mL via INTRAVENOUS

## 2023-07-21 NOTE — ED Triage Notes (Signed)
Pt c/o abd pain x2wks, "some female stuff too- urinary, yeast- you name it." Noted "something going on in vaginal area; I called my doctor & they said my bladder's coming undone." Hx sling approx 10yrs ago "that wasn't done right."  States she's been hypotensive "recently, but I don't feel differently than I do all the time." Pt endorses bloating, groin/ abd pain, bladder prolapse x2days  Pt w hx fibromyalgia, states that she has "written everything off because of dx."

## 2023-07-21 NOTE — Discharge Instructions (Addendum)
As we discussed, your workup in the ER today was reassuring for acute findings.  Laboratory evaluation and CT imaging did not reveal any emergent concerns.  Recommend calling your OB/GYN to schedule a close follow-up appointment at your earliest convenience.  Return if development of any new or worsening symptoms.

## 2023-07-21 NOTE — ED Provider Notes (Signed)
[place] EMERGENCY DEPARTMENT AT [facility] Provider Note   CSN: [id number] Arrival date & time: 07/21/23  1211     History  Chief Complaint  Patient presents with   Abdominal Pain    [name] is a 52 y.o. female.  Patient with history of fibromyalgia, endometriosis, depression presents today with complaints of abdominal pain. States that same has been located in her right lower quadrant and has been ongoing for the past few weeks. She saw her OB/GYN for this a few days ago and was diagnosed with a yeast infection. She states that she has been on the treatment for this which resolved her vaginal itching but did not improve her other symptoms. She notes that she feels bloated in her abdomen. Denies nausea, vomiting, or diarrhea. Has had a partial hysterectomy. Denies any urinary symptoms. 2 days ago she felt a 'bulge' in her vagina when urinating and is unsure what this is. No pain in this area or difficulty urinating. She does note that she had a bladder sling 4 years ago fro incontinence, did not have any prolapse at this time. She also notes that she feels dehydrated because 'my lips are dry' and is requesting fluids. She states that she was at her regular physical therapy appointment today and was telling her physical therapist her symptoms and they recommended that she come to the ER for evaluation.  The history is provided by the patient. No language interpreter was used.  Abdominal Pain      Home Medications Prior to Admission medications   Medication Sig Start Date End Date Taking? Authorizing Provider  ALPRAZolam (XANAX) 1 MG tablet 1 mg 3 (three) times daily.    [provider]  AUVELITY 45-105 MG TBCR Take 1 tablet by mouth every morning.    [provider]  naltrexone (DEPADE) 50 MG tablet Take 50 mg by mouth daily as needed.    [provider]  traZODone (DESYREL) 100 MG tablet Take 100 mg by mouth at bedtime.     [provider]      Allergies    Mirabegron, Sulfa antibiotics, Elavil [amitriptyline hcl], Iodinated contrast media, and Lyrica [pregabalin]    Review of Systems   Review of Systems  Gastrointestinal:  Positive for abdominal pain.  All other systems reviewed and are negative.   Physical Exam Updated Vital Signs BP (!) 147/93   Pulse 100   Temp 98 F (36.7 C) (Oral)   Resp 16   LMP 10/05/2013   SpO2 98%  Physical Exam Vitals and nursing note reviewed. Exam conducted with a chaperone present.  Constitutional:      General: She is not in acute distress.    Appearance: Normal appearance. She is normal weight. She is not ill-appearing, toxic-appearing or diaphoretic.  HENT:     Head: Normocephalic and atraumatic.  Cardiovascular:     Rate and Rhythm: Normal rate.  Pulmonary:     Effort: Pulmonary effort is normal. No respiratory distress.  Abdominal:     General: Abdomen is flat.     Palpations: Abdomen is soft.     Tenderness: There is abdominal tenderness in the right lower quadrant.     Hernia: No hernia is present.  Genitourinary:    Comments: No obvious prolapse. Small protuberance noted to the anterior vaginal wall when bearing down, however no obvious prolapse, could potentially be normal pelvic floor muscle contraction. No discharge or tenderness. No CMT. Musculoskeletal:  General: Normal range of motion.     Cervical back: Normal range of motion.  Skin:    General: Skin is warm and dry.  Neurological:     General: No focal deficit present.     Mental Status: She is alert.  Psychiatric:        Mood and Affect: Mood normal.        Behavior: Behavior normal.     ED Results / Procedures / Treatments   Labs (all labs ordered are listed, but only abnormal results are displayed) Labs Reviewed  COMPREHENSIVE METABOLIC PANEL - Abnormal; Notable for the following components:      Result Value   Creatinine, Ser 1.14 (*)    GFR, Estimated 57 (*)     All other components within normal limits  URINALYSIS, ROUTINE W REFLEX MICROSCOPIC - Abnormal; Notable for the following components:   Color, Urine COLORLESS (*)    All other components within normal limits  LIPASE, BLOOD  CBC WITH DIFFERENTIAL/PLATELET    EKG EKG Interpretation Date/Time:  Friday July 21 2023 13:28:46 EDT Ventricular Rate:  78 PR Interval:  139 QRS Duration:  88 QT Interval:  372 QTC Calculation: 424 R Axis:   43  Text Interpretation: Sinus rhythm Since last tracing rate slower Confirmed by Linwood Dibbles [phone]) on 07/21/2023 1:32:37 PM  Radiology CT ABDOMEN PELVIS WO CONTRAST  Result Date: 07/21/2023 CLINICAL DATA:  Lower abdominal pain for several days. History of bladder sling surgery. EXAM: CT ABDOMEN AND PELVIS WITHOUT CONTRAST TECHNIQUE: Multidetector CT imaging of the abdomen and pelvis was performed following the standard protocol without IV contrast. RADIATION DOSE REDUCTION: This exam was performed according to the departmental dose-optimization program which includes automated exposure control, adjustment of the mA and/or kV according to patient size and/or use of iterative reconstruction technique. COMPARISON:  04/13/2023. FINDINGS: Lower chest: Unremarkable. Hepatobiliary: No focal liver abnormality is seen. Status post cholecystectomy. No biliary dilatation. Pancreas: Unremarkable. No pancreatic ductal dilatation or surrounding inflammatory changes. Spleen: Normal in size without focal abnormality. Adrenals/Urinary Tract: Normal-appearing adrenal glands. Three tiny left renal calculi and 1 tiny right renal calculus. No bladder or ureteral calculi and no hydronephrosis. Stomach/Bowel: Stomach is within normal limits. Appendix appears normal. No evidence of bowel wall thickening, distention, or inflammatory changes. Vascular/Lymphatic: No significant vascular findings are present. No enlarged abdominal or pelvic lymph nodes. Reproductive: Uterus and bilateral  adnexa are unremarkable. Other: No abdominal wall hernia or abnormality. No abdominopelvic ascites. Musculoskeletal: Mild lower thoracic spine degenerative changes. IMPRESSION: 1. No acute abnormality. 2. Tiny nonobstructing bilateral renal calculi. Electronically Signed   By: Beckie Salts M.D.   On: 07/21/2023 15:17    Procedures Procedures    Medications Ordered in ED Medications  sodium chloride 0.9 % bolus 1,000 mL (1,000 mLs Intravenous New Bag/Given 07/21/23 1319)    ED Course/ Medical Decision Making/ A&P                                 Medical Decision Making Amount and/or Complexity of Data Reviewed Labs: ordered. Radiology: ordered.   This patient is a 52 y.o. female who presents to the ED for concern of abdominal pain, this involves an extensive number of treatment options, and is a complaint that carries with it a high risk of complications and morbidity. The emergent differential diagnosis prior to evaluation includes, but is not limited to, but is not limited to AAA,  gastroenteritis, appendicitis, Bowel obstruction, Bowel perforation. Gastroparesis, DKA, Hernia, Inflammatory bowel disease, mesenteric ischemia, pancreatitis, peritonitis SBP, volvulus.   This is not an exhaustive differential.   Past Medical History / Co-morbidities / Social History:  has a past medical history of ADD (attention deficit disorder), Anxiety, Arthropathy, unspecified, site unspecified, Chronic fatigue syndrome, Depression, Disc degeneration, Fibromyalgia, History of kidney stones, Migraines, Multilevel degenerative disc disease, Panic attack (2019), PTSD (post-traumatic stress disorder) (2019), Sleep disorder (01/08/2014), Sleep disturbance, unspecified, Symptomatic mammary hypertrophy (09/2018), and Unspecified nonpsychotic mental disorder.  Additional history: Chart reviewed.  Physical Exam: Physical exam performed. The pertinent findings include: generalized abdominal tenderness. No obvious  bladder prolapse noted.  Lab Tests: I ordered, and personally interpreted labs.  The pertinent results include:  Creatinine 1.14 consistent with previous. No other acute laboratory abnormalities   Imaging Studies: I ordered imaging studies including CT abdomen. I independently visualized and interpreted imaging which showed   1. No acute abnormality. 2. Tiny nonobstructing bilateral renal calculi.  I agree with the radiologist interpretation.   Cardiac Monitoring:  The patient was maintained on a cardiac monitor.  My attending physician Dr. Lynelle Doctor viewed and interpreted the cardiac monitored which showed an underlying rhythm of: sinus rhythm, no STEMI. I agree with this interpretation.   Medications: I ordered medication including fluids  for dehydration. Reevaluation of the patient after these medicines showed that the patient improved. I have reviewed the patients home medicines and have made adjustments as needed.   Disposition: After consideration of the diagnostic results and the patients response to treatment, I feel that emergency department workup does not suggest an emergent condition requiring admission or immediate intervention beyond what has been performed at this time. The plan is: Discharge with close outpatient follow-up and return precautions.  Patient's pain is mild, she has no obvious bladder prolapse on exam.  Workup is benign.  She has no nausea, vomiting, or diarrhea and is able to eat and drink without issue. Evaluation and diagnostic testing in the emergency department does not suggest an emergent condition requiring admission or immediate intervention beyond what has been performed at this time.  Plan for discharge with close PCP follow-up.  Patient is understanding and amenable with plan, educated on red flag symptoms that would prompt immediate return.  Patient discharged in stable condition.  Final Clinical Impression(s) / ED Diagnoses Final diagnoses:  Right lower  quadrant abdominal pain    Rx / DC Orders ED Discharge Orders     None     An After Visit Summary was printed and given to the patient.     Vear Clock 07/21/23 1941    Linwood Dibbles, MD 07/24/23 [id number]

## 2023-07-21 NOTE — ED Notes (Signed)
Pt reports feeling of chest tightness and a "weird feeling" in her left arm.

## 2023-07-25 DIAGNOSIS — N761 Subacute and chronic vaginitis: Secondary | ICD-10-CM | POA: Diagnosis not present

## 2023-07-25 DIAGNOSIS — N898 Other specified noninflammatory disorders of vagina: Secondary | ICD-10-CM | POA: Diagnosis not present

## 2023-07-28 DIAGNOSIS — F1020 Alcohol dependence, uncomplicated: Secondary | ICD-10-CM | POA: Diagnosis not present

## 2023-07-28 DIAGNOSIS — S46012A Strain of muscle(s) and tendon(s) of the rotator cuff of left shoulder, initial encounter: Secondary | ICD-10-CM | POA: Diagnosis not present

## 2023-07-28 DIAGNOSIS — F332 Major depressive disorder, recurrent severe without psychotic features: Secondary | ICD-10-CM | POA: Diagnosis not present

## 2023-08-11 DIAGNOSIS — F332 Major depressive disorder, recurrent severe without psychotic features: Secondary | ICD-10-CM | POA: Diagnosis not present

## 2023-08-11 DIAGNOSIS — F1020 Alcohol dependence, uncomplicated: Secondary | ICD-10-CM | POA: Diagnosis not present

## 2023-08-18 ENCOUNTER — Ambulatory Visit (INDEPENDENT_AMBULATORY_CARE_PROVIDER_SITE_OTHER): Payer: Medicare Other | Admitting: Obstetrics

## 2023-08-18 ENCOUNTER — Encounter: Payer: Self-pay | Admitting: Obstetrics

## 2023-08-18 ENCOUNTER — Other Ambulatory Visit (HOSPITAL_COMMUNITY)
Admission: RE | Admit: 2023-08-18 | Discharge: 2023-08-18 | Disposition: A | Discharge status: Home / Self Care | Payer: Medicare Other | Source: Ambulatory Visit | Attending: Obstetrics | Admitting: Obstetrics

## 2023-08-18 VITALS — BP 109/77 | HR 94 | Ht 62.21 in | Wt 181.4 lb

## 2023-08-18 DIAGNOSIS — K5900 Constipation, unspecified: Secondary | ICD-10-CM | POA: Diagnosis not present

## 2023-08-18 DIAGNOSIS — N3946 Mixed incontinence: Secondary | ICD-10-CM

## 2023-08-18 DIAGNOSIS — Z87440 Personal history of urinary (tract) infections: Secondary | ICD-10-CM | POA: Insufficient documentation

## 2023-08-18 DIAGNOSIS — Z9889 Other specified postprocedural states: Secondary | ICD-10-CM

## 2023-08-18 DIAGNOSIS — N952 Postmenopausal atrophic vaginitis: Secondary | ICD-10-CM

## 2023-08-18 DIAGNOSIS — B3731 Acute candidiasis of vulva and vagina: Secondary | ICD-10-CM | POA: Diagnosis not present

## 2023-08-18 DIAGNOSIS — N941 Unspecified dyspareunia: Secondary | ICD-10-CM | POA: Insufficient documentation

## 2023-08-18 DIAGNOSIS — R351 Nocturia: Secondary | ICD-10-CM

## 2023-08-18 DIAGNOSIS — N898 Other specified noninflammatory disorders of vagina: Secondary | ICD-10-CM

## 2023-08-18 LAB — POCT URINALYSIS DIPSTICK
Bilirubin, UA: NEGATIVE
Blood, UA: NEGATIVE
Glucose, UA: NEGATIVE
Ketones, UA: NEGATIVE
Leukocytes, UA: NEGATIVE
Nitrite, UA: NEGATIVE
Protein, UA: NEGATIVE
Spec Grav, UA: 1.025 (ref 1.010–1.025)
Urobilinogen, UA: 0.2 U/dL — AB
pH, UA: 6 (ref 5.0–8.0)

## 2023-08-18 MED ORDER — ESTRADIOL 0.1 MG/GM VA CREA: 0.5000 g | TOPICAL_CREAM | VAGINAL | 11 refills | Status: DC

## 2023-08-18 MED ORDER — PHENAZOPYRIDINE HCL 95 MG PO TABS: 95.0000 mg | ORAL_TABLET | Freq: Three times a day (TID) | ORAL | 1 refills | Status: DC | PRN

## 2023-08-18 NOTE — Patient Instructions (Signed)
For treatment of stress urinary incontinence, which is leakage with physical activity/movement/strainging/coughing, we discussed expectant management versus nonsurgical options versus surgery. Nonsurgical options include weight loss, physical therapy, as well as a pessary.  Surgical options include a midurethral sling, which is a synthetic mesh sling that acts like a hammock under the urethra to prevent leakage of urine, a Burch urethropexy, and transurethral injection of a bulking agent.   We discussed the symptoms of overactive bladder (OAB), which include urinary urgency, urinary frequency, night-time urination, with or without urge incontinence.  We discussed management including behavioral therapy (decreasing bladder irritants by following a bladder diet, urge suppression strategies, timed voids, bladder retraining), physical therapy, medication; and for refractory cases posterior tibial nerve stimulation, sacral neuromodulation, and intravesical botulinum toxin injection.   Continue Gemtesa.   Constipation: Our goal is to achieve formed bowel movements daily or every-other-day.  You may need to try different combinations of the following options to find what works best for you - everybody's body works differently so feel free to adjust the dosages as needed.  Some options to help maintain bowel health include:  Dietary changes (more leafy greens, vegetables and fruits; less processed foods) Fiber supplementation (Benefiber, FiberCon, Metamucil or Psyllium). Start slow and increase gradually to full dose. Over-the-counter agents such as: stool softeners (Docusate or Colace) and/or laxatives (Miralax, milk of magnesia)  "Power Pudding" is a natural mixture that may help your constipation.  To make blend 1 cup applesauce, 1 cup wheat bran, and 3/4 cup prune juice, refrigerate and then take 1 tablespoon daily with a large glass of water as needed.   Women should try to eat at least 21 to 25 grams of  fiber a day, while men should aim for 30 to 38 grams a day. You can add fiber to your diet with food or a fiber supplement such as psyllium (metamucil), benefiber, or fibercon.   Here's a look at how much dietary fiber is found in some common foods. When buying packaged foods, check the Nutrition Facts label for fiber content. It can vary among brands.  Fruits Serving size Total fiber (grams)*  Raspberries 1 cup 8.0  Pear 1 medium 5.5  Apple, with skin 1 medium 4.5  Banana 1 medium 3.0  Orange 1 medium 3.0  Strawberries 1 cup 3.0   Vegetables Serving size Total fiber (grams)*  Green peas, boiled 1 cup 9.0  Broccoli, boiled 1 cup chopped 5.0  Turnip greens, boiled 1 cup 5.0  Brussels sprouts, boiled 1 cup 4.0  Potato, with skin, baked 1 medium 4.0  Sweet corn, boiled 1 cup 3.5  Cauliflower, raw 1 cup chopped 2.0  Carrot, raw 1 medium 1.5   Grains Serving size Total fiber (grams)*  Spaghetti, whole-wheat, cooked 1 cup 6.0  Barley, pearled, cooked 1 cup 6.0  Bran flakes 3/4 cup 5.5  Quinoa, cooked 1 cup 5.0  Oat bran muffin 1 medium 5.0  Oatmeal, instant, cooked 1 cup 5.0  Popcorn, air-popped 3 cups 3.5  Brown rice, cooked 1 cup 3.5  Bread, whole-wheat 1 slice 2.0  Bread, rye 1 slice 2.0   Legumes, nuts and seeds Serving size Total fiber (grams)*  Split peas, boiled 1 cup 16.0  Lentils, boiled 1 cup 15.5  Black beans, boiled 1 cup 15.0  Baked beans, canned 1 cup 10.0  Chia seeds 1 ounce 10.0  Almonds 1 ounce (23 nuts) 3.5  Pistachios 1 ounce (49 nuts) 3.0  Sunflower kernels 1 ounce 3.0  *  Rounded to nearest 0.5 gram. Source: [employer] for [employer], [employer] Release    For symptomatic vaginal atrophy options include lubrication with a water-based lubricant, personal hygiene measures and barrier protection against wetness, and estrogen replacement in the form of vaginal cream, vaginal tablets, or a time-released vaginal ring.     Start vaginal  estrogen therapy nightly for two weeks then 2 times weekly at night for treatment of vaginal atrophy (dryness of the vaginal tissues).  Please let us know if the prescription is too expensive and we can look for alternative options.    For treatment of recurrent urinary tract infections, we discussed management of recurrent UTIs including prophylaxis with a daily low dose antibiotic, transvaginal estrogen therapy, D-mannose, and cranberry supplements.  We discussed the role of diagnostic testing such as cystoscopy and upper tract imaging.    Start pyridium and ibuprofen prior to antibiotic use when you experience change in urinary symptoms.

## 2023-08-18 NOTE — Assessment & Plan Note (Signed)
-   For symptomatic vaginal atrophy options include lubrication with a water-based lubricant, personal hygiene measures and barrier protection against wetness, and estrogen replacement in the form of vaginal cream, vaginal tablets, or a time-released vaginal ring.   - start vaginal estrogen 0.5-1g twice a week

## 2023-08-18 NOTE — Assessment & Plan Note (Signed)
-   For constipation, we reviewed the importance of a better bowel regimen.  We also discussed the importance of avoiding chronic straining, as it can exacerbate her pelvic floor symptoms; we discussed treating constipation and straining prior to surgery, as postoperative straining can lead to damage to the repair and recurrence of symptoms. We discussed initiating therapy with increasing fluid intake, fiber supplementation, stool softeners, and laxatives such as miralax.  - continue fiber supplementation and start MiraLAX with titration until optimal bowel consistency

## 2023-08-18 NOTE — Assessment & Plan Note (Addendum)
-   POCT with + uro only - For treatment of recurrent urinary tract infections, we discussed management of recurrent UTIs including prophylaxis with a daily low dose antibiotic, transvaginal estrogen therapy, D-mannose, and cranberry supplements.  We discussed the role of diagnostic testing such as cystoscopy and upper tract imaging.   - Rx Pyridium and start ibuprofen as needed UTI symptoms, encouraged to avoid antibiotic exposure if unclear lower urinary tract symptoms - Start vaginal estrogen 0.5 to 1 g nightly for 2 weeks, followed by twice a week - NuSwab to rule out vaginal infection - Discussed risk of resistant uropathogens with repeated antibiotic exposure

## 2023-08-18 NOTE — Assessment & Plan Note (Addendum)
-   History of pelvic floor PT with relief in 2015 - Bilateral pelvic floor myofascial pain on exam today - Encouraged to optimize stool consistency - Start low-dose vaginal estrogen for atrophy - Referral placed for pelvic floor PT to start  - No enlargement of Bartholin cyst on exam

## 2023-08-18 NOTE — Assessment & Plan Note (Signed)
-   Midurethral sling placed in 08/11/17 with mesh excision 01/09/18 by Dr. Sherron Monday - Postvoid residual 18 mL - Discussed need for urodynamics prior to third line therapy -

## 2023-08-18 NOTE — Assessment & Plan Note (Addendum)
-   pending Nuswab - multiple recent antibiotic courses for UTI - start vaginal estrogen

## 2023-08-18 NOTE — Assessment & Plan Note (Signed)
For night time frequency: - avoid fluid intake after 6pm - timed voids during daytime - reduce caffeine intake - history of sleep disorder

## 2023-08-18 NOTE — Assessment & Plan Note (Addendum)
-   UDS prior to midurethral sling in 2018 with bladder capacity, cough induced detrusor overactivity incontinence, pelvic floor dyssynergia and history of dyspareunia with endometriosis status post pelvic floor PT -Reports some relief with Gemtesa -Will avoid anticholinergics due to history of lower extremity edema from Detrol Constant, oxybutynin, Vesicare and chronic constipation - consider bladder pain syndrome due to IBS, migraine and recurrent lower urinary tract symptoms  - We discussed the symptoms of overactive bladder (OAB), which include urinary urgency, urinary frequency, nocturia, with or without urge incontinence.  While we do not know the exact etiology of OAB, several treatment options exist. We discussed management including behavioral therapy (decreasing bladder irritants, urge suppression strategies, timed voids, bladder retraining), physical therapy, medication; for refractory cases posterior tibial nerve stimulation, sacral neuromodulation, and intravesical botulinum toxin injection.  -Encouraged timed voids and bladder training to an increase daytime urinary frequency -Continue to empty send referral for pelvic floor PT due to pelvic floor myofascial pain on exam -Discussed need to repeat urodynamics prior to third line therapy due to history of mid urethral sling and excision - pt considering PTNS due to history of accupuncture  - positive cough stress test for stress urinary incontinence - For treatment of stress urinary incontinence,  non-surgical options include expectant management, weight loss, physical therapy, as well as a pessary.  Surgical options include a midurethral sling, Burch urethropexy, and transurethral injection of a bulking agent.

## 2023-08-18 NOTE — Progress Notes (Addendum)
New Patient Evaluation and Consultation  Referring Provider: Olivia Mackie, MD PCP: Creola Corn, MD Date of Service: 08/18/2023  SUBJECTIVE Chief Complaint: New Patient (Initial Visit) [name] is a 52 y.o. female here today for prolapse evaluation. Hx of a bladder sling and had it removed (~2016))  History of Present Illness: [name] is a 52 y.o. White or Caucasian female seen in consultation at the request of Dr Billy Coast for evaluation of pelvic organ prolapse.   Pending shoulder surgery next Monday 08/21/23 Reports intermittent right sided painful vaginal bulge at vaginal opening History of midurethral mesh excision by Dr. Sherron Monday in 2019. Per chart review 1.2cm of mesh excised from right suburethral region  Mixed urinary incontinence with relief from [place], history of LE edema from Detrol, oxybutynin, and Vesicare medications. Some relief from mirabegron in the past Urethral hypermobility with negative CST on 04/13/23 Midurethral sling placed in 08/11/17 with mesh excision 01/09/18.  UDS last in 11/29/16: - Uroflow with low volume void 75 - CMG  First sensation: unstable contraction First Desire: Strong Desire: Capacity: Cough induced DOI noted  Pressure flow after 2 involuntary voids. Voided 155 mL, Qmax 39mL/s, Pdet at Qmax 13cmH2O, max Pdet 30cmH2O. PVR 0mL Dyssynergic EMG with increased activity during void Fluoroscopy with bladder neck to send approximately 2 to 3 cm during Cough or Valsalva.  Trabeculated bladder wall during filling, no reflux  Unclear history of vaginal graft in 2019 per patient CT stone protocol 06/11/23 for R flank pain, nausea, chills with punctate left renal stones and stool in ascending and transverse colon. History of calcium oxalate stones Receives botox for migraines History of pelvic floor PT for dyspareunia in 2015 with relief of pain, reports bladder spasms with sensation of  incomplete bladder emptying prior to midurethral sling Reports history of laparoscopy for endometriosis in high school   Review of records significant for: Chronic fatigue, migraines, PTSD on trazadone, xanax, naltrexone, auvelity  Urinary Symptoms: Leaks urine with cough/ sneeze, laughing, exercise, lifting, going from sitting to standing, with a full bladder, with movement to the bathroom, with urgency, and without sensation Leaks with cough/sneeze/laughing 3-5x/day with small volume leakage, improved from prior to sling and starting Gemtesa Leaks 3-4 time(s) per months with urgency Pad use:  3-4  liners/ mini-pads per day.   Patient is bothered by UI symptoms.  Day time voids 3.  Nocturia: 2 times per night to void with history of sleep disorder Reports timed voids in the past with relief of leakage Voiding dysfunction:  does not empty bladder well.  Patient does not use a catheter to empty bladder.  When urinating, patient feels dribbling after finishing Drinks: <64 oz water per day, 2-3 cups of coffee/tea/sodas (cutdown from 4-5/day)  UTIs: 2 UTI's in the last year.   Reports history of calcium oxalate kidney or bladder stones last in 2022 No results found for the last 90 days. Uses macrobid PRN UTI symptoms, uses probiotics, uses estrogen patch UTI symptoms: tingling during urination, intermittent increased frequency/urgency Denies pyridium use  Pelvic Organ Prolapse Symptoms:                  Patient Admits to a feeling of a swelling bulge on the right side of the vagina. It has been present for 1 month when she experienced recurrent UTI symptoms.  Patient Denies seeing a bulge.  This swelling irritation/bulge is bothersome.  Bowel Symptom: Bowel movements: 1 time(s) per 2 weeks  with history of IBS Stool consistency: hard Straining: yes.  Splinting: yes.  Incomplete evacuation: yes.  Patient Admits to accidental bowel leakage / fecal incontinence for a few years described  as clear liquid, denies stool leakage  Occurs: 2-3 time(s) per year  Consistency with leakage: clear liquid only when she wipes when she voids Bowel regimen: none Last colonoscopy: Date 2-4 years ago by Dr. Loreta Ave, managed by Dr. Timothy Lasso [facility]          Overdue - Colonoscopy (Every 10 Years) Never done    No completion history exists for this topic.           Sexual Function Sexually active: yes.  Sexual orientation: Straight Pain with sex: Yes, at the vaginal opening, deep in the pelvis, has discomfort due to dryness Mainly with insertion, alleviated by position change and lubrication use Denies history of sexual abuse/physical abuse  Pelvic Pain Denies pelvic pain without intercourse  Past Medical History:  Past Medical History:  Diagnosis Date   ADD (attention deficit disorder)    Anxiety    Arthropathy, unspecified, site unspecified    Chronic fatigue syndrome    Depression    Disc degeneration    Fibromyalgia    History of kidney stones    last stone in 2010   Migraines    Multilevel degenerative disc disease    cervical and lumbar, has had injections/epidural   Panic attack 2019   PTSD (post-traumatic stress disorder) 2019   Sleep disorder 01/08/2014   Sleep disturbance, unspecified    Symptomatic mammary hypertrophy 09/2018   Unspecified nonpsychotic mental disorder      Past Surgical History:   Past Surgical History:  Procedure Laterality Date   BILATERAL SALPINGECTOMY Bilateral 10/16/2013   Procedure: BILATERAL SALPINGECTOMY;  Surgeon: Lenoard Aden, MD;  Location: [facility];  Service: Gynecology;  Laterality: Bilateral;   BREAST REDUCTION SURGERY Bilateral 10/03/2018   Procedure: BILATERAL BREAST REDUCTION;  Surgeon: Peggye Form, DO;  Location: [facility];  Service: Plastics;  Laterality: Bilateral;   CESAREAN SECTION     x2   CHOLECYSTECTOMY     DIAGNOSTIC LAPAROSCOPY     MANDIBLE SURGERY     REMOVAL OF URINARY SLING  N/A 01/09/2018   Procedure: REMOVAL OF MESH URINARY SLING EXTRUSION  CYSTOSTOPY;  Surgeon: Alfredo Martinez, MD;  Location: [facility];  Service: Urology;  Laterality: N/A;   ROBOTIC ASSISTED TOTAL HYSTERECTOMY N/A 10/16/2013   Procedure: ROBOTIC ASSISTED TOTAL HYSTERECTOMY;  Surgeon: Lenoard Aden, MD;  Location: [facility];  Service: Gynecology;  Laterality: N/A;     Past OB/GYN History: OB History  Gravida Para Term Preterm AB Living  2 0 0   2    SAB IAB Ectopic Multiple Live Births  2            # Outcome Date GA Lbr Len/2nd Weight Sex Type Anes PTL Lv  2 SAB           1 SAB             Vaginal deliveries: 0,  Forceps/ Vacuum deliveries: 0, Cesarean section: 2 Menopausal: Yes, at age late 20s-early 52s, Denies vaginal bleeding since menopause Contraception: RATLH. Last pap smear was 2023, pending return visit for yearly pap smear per pt.  Any history of abnormal pap smears: yes with cryotherapy.  Denies cervical excision with LEEP or CKC No results found for: "DIAGPAP", "HPVHIGH", "ADEQPAP"  Medications: Patient has a current medication list  which includes the following prescription(s): alprazolam, auvelity, [START ON 08/21/2023] estradiol, estradiol, naltrexone, phenazopyridine, trazodone, and gemtesa.   Allergies: Patient is allergic to mirabegron, sulfa antibiotics, elavil [amitriptyline hcl], iodinated contrast media, and lyrica [pregabalin].   Social History:  Social History   Tobacco Use   Smoking status: Never   Smokeless tobacco: Never  Vaping Use   Vaping status: Never Used  Substance Use Topics   Alcohol use: Yes    Comment: socially, once per month   Drug use: Yes    Types: Marijuana    Comment: prn when pain gets bad    Relationship status: married Patient lives with her spouse.   Patient is not employed. Regular exercise: No History of abuse: No  Family History:   Family History  Problem Relation Age of Onset   Heart attack Mother    Hypertension  Mother    Depression Mother    Neuropathy Father    Diabetes Father    Cancer Paternal Grandfather    Stroke Maternal Grandmother    Alcohol abuse Maternal Grandfather    Breast cancer Neg Hx      Review of Systems: Review of Systems  Constitutional:  Positive for malaise/fatigue. Negative for fever and weight loss.       Weight gain  Respiratory:  Positive for cough and shortness of breath. Negative for wheezing.   Cardiovascular:  Positive for chest pain and leg swelling. Negative for palpitations.  Gastrointestinal:  Positive for abdominal pain. Negative for blood in stool.  Genitourinary:  Positive for dysuria and frequency. Negative for hematuria.       Vaginal discharge  Skin:  Negative for rash.  Neurological:  Positive for weakness and headaches. Negative for dizziness.  Endo/Heme/Allergies:  Bruises/bleeds easily.       Hot flashes  Psychiatric/Behavioral:  Negative for depression. The patient is not nervous/anxious.      OBJECTIVE Physical Exam: Vitals:   08/18/23 0922  BP: 109/77  Pulse: 94  Weight: 181 lb 6.4 oz (82.3 kg)  Height: 5' 2.21" (1.58 m)    Physical Exam Constitutional:      General: She is not in acute distress.    Appearance: Normal appearance.  Genitourinary:     Bladder and urethral meatus normal.     No lesions in the vagina.     Right Labia: tenderness.     Right Labia: No rash, lesions, skin changes or Bartholin's cyst (no enlargement, fluid collection noted).    Left Labia: No tenderness, lesions, skin changes, Bartholin's cyst or rash.    No vaginal discharge, erythema, tenderness, bleeding, ulceration or granulation tissue.     No vaginal prolapse present.    Moderate vaginal atrophy present.     Right Adnexa: not tender, not full and no mass present.    Left Adnexa: not tender, not full and no mass present.    Cervix is absent.     Uterus is absent.     Urethral meatus caruncle not present.    Urethral stress urinary  incontinence with cough stress test present.     No urethral prolapse, tenderness, mass, hypermobility or discharge present.     Bladder is not tender, urgency on palpation not present and masses not present.      Pelvic Floor: Levator muscle strength is 4/5.    Levator ani is tender and obturator internus is tender.     No asymmetrical contractions present and no pelvic spasms present.    Anal wink  present and BC reflex present. Cardiovascular:     Rate and Rhythm: Normal rate.  Pulmonary:     Effort: Pulmonary effort is normal. No respiratory distress.  Abdominal:     General: There is no distension.     Palpations: There is no mass.     Tenderness: There is no abdominal tenderness.     Hernia: No hernia is present.    Neurological:     Mental Status: She is alert.  Vitals reviewed. Exam conducted with a chaperone present.     POP-Q:   POP-Q  -3                                            Aa   -3                                           Ba  -8                                              C   1                                            Gh  3                                            Pb  8                                            tvl   -3                                            Ap  -3                                            Bp                                                 D    Post-Void Residual (PVR) by Bladder Scan: In order to evaluate bladder emptying, we discussed obtaining a postvoid residual and patient agreed to this procedure.  Procedure: The ultrasound unit was placed on the patient's abdomen in the suprapubic region after the patient had voided.    Post Void Residual - 08/18/23 0933       Post Void Residual   Post Void Residual 18 mL  Laboratory Results: Lab Results  Component Value Date   COLORU yellow 08/18/2023   CLARITYU clear 08/18/2023   GLUCOSEUR Negative 08/18/2023   BILIRUBINUR negative 08/18/2023    KETONESU negative 08/18/2023   SPECGRAV 1.025 08/18/2023   RBCUR negative 08/18/2023   PHUR 6.0 08/18/2023   PROTEINUR Negative 08/18/2023   UROBILINOGEN 0.2 (A) 08/18/2023   LEUKOCYTESUR Negative 08/18/2023    Lab Results  Component Value Date   CREATININE 1.14 (H) 07/21/2023   CREATININE 1.05 (H) 06/19/2022   CREATININE 1.15 (H) 07/05/2021    No results found for: "HGBA1C"  Lab Results  Component Value Date   HGB 14.1 07/21/2023     ASSESSMENT AND PLAN Ms. Gertz is a 52 y.o. with:  1. Dyspareunia in female   2. Constipation, unspecified constipation type   3. Nocturia   4. Urinary incontinence, mixed   5. History of pelvic surgery   6. History of recurrent UTI (urinary tract infection)   7. Vaginal atrophy   8. Vaginal irritation     Dyspareunia in female Assessment & Plan: - History of pelvic floor PT with relief in 2015 - Bilateral pelvic floor myofascial pain on exam today - Encouraged to optimize stool consistency - Start low-dose vaginal estrogen for atrophy - Referral placed for pelvic floor PT to start  - No enlargement of Bartholin cyst on exam  Orders: -     AMB referral to rehabilitation  Constipation, unspecified constipation type Assessment & Plan: - For constipation, we reviewed the importance of a better bowel regimen.  We also discussed the importance of avoiding chronic straining, as it can exacerbate her pelvic floor symptoms; we discussed treating constipation and straining prior to surgery, as postoperative straining can lead to damage to the repair and recurrence of symptoms. We discussed initiating therapy with increasing fluid intake, fiber supplementation, stool softeners, and laxatives such as miralax.  - continue fiber supplementation and start MiraLAX with titration until optimal bowel consistency   Orders: -     AMB referral to rehabilitation  Nocturia Assessment & Plan: For night time frequency: - avoid fluid intake after  6pm - timed voids during daytime - reduce caffeine intake - history of sleep disorder   Orders: -     AMB referral to rehabilitation  Urinary incontinence, mixed Assessment & Plan: - UDS prior to midurethral sling in 2018 with bladder capacity, cough induced detrusor overactivity incontinence, pelvic floor dyssynergia and history of dyspareunia with endometriosis status post pelvic floor PT -Reports some relief with Gemtesa -Will avoid anticholinergics due to history of lower extremity edema from Detrol Constant, oxybutynin, Vesicare and chronic constipation - consider bladder pain syndrome due to IBS, migraine and recurrent lower urinary tract symptoms  - We discussed the symptoms of overactive bladder (OAB), which include urinary urgency, urinary frequency, nocturia, with or without urge incontinence.  While we do not know the exact etiology of OAB, several treatment options exist. We discussed management including behavioral therapy (decreasing bladder irritants, urge suppression strategies, timed voids, bladder retraining), physical therapy, medication; for refractory cases posterior tibial nerve stimulation, sacral neuromodulation, and intravesical botulinum toxin injection.  -Encouraged timed voids and bladder training to an increase daytime urinary frequency -Continue to empty send referral for pelvic floor PT due to pelvic floor myofascial pain on exam -Discussed need to repeat urodynamics prior to third line therapy due to history of mid urethral sling and excision - pt considering PTNS due to history of accupuncture  - positive cough stress  test for stress urinary incontinence - For treatment of stress urinary incontinence,  non-surgical options include expectant management, weight loss, physical therapy, as well as a pessary.  Surgical options include a midurethral sling, Burch urethropexy, and transurethral injection of a bulking agent.  Orders: -     POCT urinalysis dipstick -      AMB referral to rehabilitation  History of pelvic surgery Assessment & Plan: - Midurethral sling placed in 08/11/17 with mesh excision 01/09/18 by Dr. Sherron Monday - Postvoid residual 18 mL - Discussed need for urodynamics prior to third line therapy -   Orders: -     AMB referral to rehabilitation  History of recurrent UTI (urinary tract infection) Assessment & Plan: - POCT with + uro only - For treatment of recurrent urinary tract infections, we discussed management of recurrent UTIs including prophylaxis with a daily low dose antibiotic, transvaginal estrogen therapy, D-mannose, and cranberry supplements.  We discussed the role of diagnostic testing such as cystoscopy and upper tract imaging.   - Rx Pyridium and start ibuprofen as needed UTI symptoms, encouraged to avoid antibiotic exposure if unclear lower urinary tract symptoms - Start vaginal estrogen 0.5 to 1 g nightly for 2 weeks, followed by twice a week - NuSwab to rule out vaginal infection - Discussed risk of resistant uropathogens with repeated antibiotic exposure  Orders: -     Phenazopyridine HCl; Take 1 tablet (95 mg total) by mouth 3 (three) times daily as needed for pain.  Dispense: 30 tablet; Refill: 1  Vaginal atrophy Assessment & Plan: - For symptomatic vaginal atrophy options include lubrication with a water-based lubricant, personal hygiene measures and barrier protection against wetness, and estrogen replacement in the form of vaginal cream, vaginal tablets, or a time-released vaginal ring.   - start vaginal estrogen 0.5-1g twice a week   Vaginal irritation Assessment & Plan: - pending Nuswab - multiple recent antibiotic courses for UTI - start vaginal estrogen  Orders: -     Cervicovaginal ancillary only  Other orders -     Estradiol; Place 0.5 g vaginally 2 (two) times a week. Place 0.5g nightly for two weeks then twice a week after  Dispense: 30 g; Refill: 11   Time spent: I spent 83 minutes  dedicated to the care of this patient on the date of this encounter to include pre-visit review of records, face-to-face time with the patient discussing mixed urinary incontinence, history of midurethral sling excision, vaginal atrophy, vaginal irritation, rUTI, nocturia, dyspareunia and pelvic floor dysfunction and post visit documentation and ordering medication/ testing.    Loleta Chance, MD

## 2023-08-21 DIAGNOSIS — M7582 Other shoulder lesions, left shoulder: Secondary | ICD-10-CM | POA: Diagnosis not present

## 2023-08-21 DIAGNOSIS — M25812 Other specified joint disorders, left shoulder: Secondary | ICD-10-CM | POA: Diagnosis not present

## 2023-08-21 DIAGNOSIS — M24112 Other articular cartilage disorders, left shoulder: Secondary | ICD-10-CM | POA: Diagnosis not present

## 2023-08-21 DIAGNOSIS — M19012 Primary osteoarthritis, left shoulder: Secondary | ICD-10-CM | POA: Diagnosis not present

## 2023-08-21 DIAGNOSIS — M7552 Bursitis of left shoulder: Secondary | ICD-10-CM | POA: Diagnosis not present

## 2023-08-21 DIAGNOSIS — S43432A Superior glenoid labrum lesion of left shoulder, initial encounter: Secondary | ICD-10-CM | POA: Diagnosis not present

## 2023-08-21 DIAGNOSIS — M7542 Impingement syndrome of left shoulder: Secondary | ICD-10-CM | POA: Diagnosis not present

## 2023-08-21 DIAGNOSIS — X58XXXA Exposure to other specified factors, initial encounter: Secondary | ICD-10-CM | POA: Diagnosis not present

## 2023-08-21 DIAGNOSIS — G8918 Other acute postprocedural pain: Secondary | ICD-10-CM | POA: Diagnosis not present

## 2023-08-21 DIAGNOSIS — Y999 Unspecified external cause status: Secondary | ICD-10-CM | POA: Diagnosis not present

## 2023-08-21 DIAGNOSIS — S46212A Strain of muscle, fascia and tendon of other parts of biceps, left arm, initial encounter: Secondary | ICD-10-CM | POA: Diagnosis not present

## 2023-08-21 DIAGNOSIS — M7522 Bicipital tendinitis, left shoulder: Secondary | ICD-10-CM | POA: Diagnosis not present

## 2023-08-21 HISTORY — PX: ROTATOR CUFF REPAIR: SHX139

## 2023-08-21 LAB — CERVICOVAGINAL ANCILLARY ONLY
Bacterial Vaginitis (gardnerella): NEGATIVE
Candida Glabrata: NEGATIVE
Candida Vaginitis: NEGATIVE
Comment: NEGATIVE
Comment: NEGATIVE
Comment: NEGATIVE

## 2023-08-24 DIAGNOSIS — M25612 Stiffness of left shoulder, not elsewhere classified: Secondary | ICD-10-CM | POA: Diagnosis not present

## 2023-08-24 DIAGNOSIS — M6281 Muscle weakness (generalized): Secondary | ICD-10-CM | POA: Diagnosis not present

## 2023-08-24 DIAGNOSIS — M7542 Impingement syndrome of left shoulder: Secondary | ICD-10-CM | POA: Diagnosis not present

## 2023-08-24 DIAGNOSIS — S46211D Strain of muscle, fascia and tendon of other parts of biceps, right arm, subsequent encounter: Secondary | ICD-10-CM | POA: Diagnosis not present

## 2023-08-29 DIAGNOSIS — M7542 Impingement syndrome of left shoulder: Secondary | ICD-10-CM | POA: Diagnosis not present

## 2023-08-30 ENCOUNTER — Ambulatory Visit (INDEPENDENT_AMBULATORY_CARE_PROVIDER_SITE_OTHER): Payer: Medicare Other | Admitting: Vascular Surgery

## 2023-08-30 ENCOUNTER — Encounter: Payer: Self-pay | Admitting: Vascular Surgery

## 2023-08-30 VITALS — BP 118/88 | HR 77 | Temp 98.0°F | Resp 20 | Ht 62.0 in | Wt 185.0 lb

## 2023-08-30 DIAGNOSIS — M25612 Stiffness of left shoulder, not elsewhere classified: Secondary | ICD-10-CM | POA: Diagnosis not present

## 2023-08-30 DIAGNOSIS — I872 Venous insufficiency (chronic) (peripheral): Secondary | ICD-10-CM | POA: Diagnosis not present

## 2023-08-30 DIAGNOSIS — M6281 Muscle weakness (generalized): Secondary | ICD-10-CM | POA: Diagnosis not present

## 2023-08-30 DIAGNOSIS — M7542 Impingement syndrome of left shoulder: Secondary | ICD-10-CM | POA: Diagnosis not present

## 2023-08-30 DIAGNOSIS — S46211D Strain of muscle, fascia and tendon of other parts of biceps, right arm, subsequent encounter: Secondary | ICD-10-CM | POA: Diagnosis not present

## 2023-08-30 NOTE — Progress Notes (Signed)
Patient ID: [name], female   DOB: 12/22/1970, 52 y.o.   MRN: [id number]  Reason for Consult: Follow-up   Referred by Creola Corn, MD  Subjective:     HPI:  [name] is a 52 y.o. female with history of bilateral lower extremity swelling and pain at the site of multiple varicosities. She specifically has throbbing at the size of the varicosities particularly as the day progresses.  She denies any history of DVT and no previous history of venous intervention.  Recently underwent shoulder surgery which she is currently healing from.  She has been compliant with thigh-high compression stockings with minimal relief.  No bleeding or thrombosis of her varicose veins in the past.  Past Medical History:  Diagnosis Date   ADD (attention deficit disorder)    Anxiety    Arthropathy, unspecified, site unspecified    Chronic fatigue syndrome    Depression    Disc degeneration    Fibromyalgia    History of kidney stones    last stone in 2010   Migraines    Multilevel degenerative disc disease    cervical and lumbar, has had injections/epidural   Panic attack 2019   PTSD (post-traumatic stress disorder) 2019   Sleep disorder 01/08/2014   Sleep disturbance, unspecified    Symptomatic mammary hypertrophy 09/2018   Unspecified nonpsychotic mental disorder    Family History  Problem Relation Age of Onset   Heart attack Mother    Hypertension Mother    Depression Mother    Neuropathy Father    Diabetes Father    Cancer Paternal Grandfather    Stroke Maternal Grandmother    Alcohol abuse Maternal Grandfather    Breast cancer Neg Hx    Past Surgical History:  Procedure Laterality Date   BILATERAL SALPINGECTOMY Bilateral 10/16/2013   Procedure: BILATERAL SALPINGECTOMY;  Surgeon: Lenoard Aden, MD;  Location: [facility];  Service: Gynecology;  Laterality: Bilateral;   BREAST REDUCTION SURGERY Bilateral 10/03/2018   Procedure: BILATERAL BREAST REDUCTION;   Surgeon: Peggye Form, DO;  Location: [facility];  Service: Plastics;  Laterality: Bilateral;   CESAREAN SECTION     x2   CHOLECYSTECTOMY     DIAGNOSTIC LAPAROSCOPY     MANDIBLE SURGERY     REMOVAL OF URINARY SLING N/A 01/09/2018   Procedure: REMOVAL OF MESH URINARY SLING EXTRUSION  CYSTOSTOPY;  Surgeon: Alfredo Martinez, MD;  Location: [facility];  Service: Urology;  Laterality: N/A;   ROBOTIC ASSISTED TOTAL HYSTERECTOMY N/A 10/16/2013   Procedure: ROBOTIC ASSISTED TOTAL HYSTERECTOMY;  Surgeon: Lenoard Aden, MD;  Location: [facility];  Service: Gynecology;  Laterality: N/A;   ROTATOR CUFF REPAIR Left 08/21/2023    Short Social History:  Social History   Tobacco Use   Smoking status: Never   Smokeless tobacco: Never  Substance Use Topics   Alcohol use: Yes    Comment: socially, once per month    Allergies  Allergen Reactions   Mirabegron Swelling    Lower extremities    Sulfa Antibiotics Hives    All over body   Elavil [Amitriptyline Hcl] Anxiety   Iodinated Contrast Media Rash   Lyrica [Pregabalin] Swelling and Rash    Lower extremities     Current Outpatient Medications  Medication Sig Dispense Refill   ALPRAZolam (XANAX) 1 MG tablet 1 mg 3 (three) times daily.     AUVELITY 45-105 MG TBCR Take 1 tablet by mouth every morning.  estradiol (ESTRACE) 0.1 MG/GM vaginal cream Place 0.5 g vaginally 2 (two) times a week. Place 0.5g nightly for two weeks then twice a week after 30 g 11   estradiol (VIVELLE-DOT) 0.05 MG/24HR patch Place 1 patch onto the skin 2 (two) times a week.     naltrexone (DEPADE) 50 MG tablet Take 50 mg by mouth daily as needed.     phenazopyridine (PYRIDIUM) 95 MG tablet Take 1 tablet (95 mg total) by mouth 3 (three) times daily as needed for pain. 30 tablet 1   traZODone (DESYREL) 100 MG tablet Take 100 mg by mouth at bedtime.     Vibegron (GEMTESA) 75 MG TABS Take by mouth.     No current facility-administered medications for  this visit.    Review of Systems  Constitutional:  Constitutional negative. HENT: HENT negative.  Eyes: Eyes negative.  Respiratory: Respiratory negative.  Cardiovascular: Positive for leg swelling.  GI: Gastrointestinal negative.  Musculoskeletal: Positive for leg pain.  Neurological: Neurological negative. Hematologic: Hematologic/lymphatic negative.  Psychiatric: Psychiatric negative.        Objective:  Objective   Vitals:   08/30/23 0940  BP: 118/88  Pulse: 77  Resp: 20  Temp: 98 F (36.7 C)  SpO2: 97%  Weight: 185 lb (83.9 kg)  Height: 5\' 2"  (1.575 m)   Body mass index is 33.84 kg/m.  Physical Exam HENT:     Head: Normocephalic.     Nose: Nose normal.  Eyes:     Pupils: Pupils are equal, round, and reactive to light.  Cardiovascular:     Rate and Rhythm: Normal rate.     Pulses: Normal pulses.  Pulmonary:     Effort: Pulmonary effort is normal.  Abdominal:     General: Abdomen is flat.  Musculoskeletal:     Cervical back: Normal range of motion.     Right lower leg: Edema present.     Left lower leg: Edema present.  Skin:    General: Skin is warm.     Capillary Refill: Capillary refill takes less than 2 seconds.  Neurological:     General: No focal deficit present.     Mental Status: She is alert.  Psychiatric:        Mood and Affect: Mood normal.        Data: Venous Reflux Times  +--------------+---------+------+-----------+------------+-------------+  RIGHT        Reflux NoRefluxReflux TimeDiameter cmsComments                               Yes                                        +--------------+---------+------+-----------+------------+-------------+  CFV          no                                                   +--------------+---------+------+-----------+------------+-------------+  FV prox       no                                                    +--------------+---------+------+-----------+------------+-------------+  FV mid        no                                                   +--------------+---------+------+-----------+------------+-------------+  FV dist       no                                                   +--------------+---------+------+-----------+------------+-------------+  Popliteal    no                                                   +--------------+---------+------+-----------+------------+-------------+  GSV at SFJ              yes    >500 ms      0.75                   +--------------+---------+------+-----------+------------+-------------+  GSV prox thigh          yes    >500 ms      0.65    branching      +--------------+---------+------+-----------+------------+-------------+  GSV mid thigh           yes    >500 ms      0.31    branching      +--------------+---------+------+-----------+------------+-------------+  GSV dist thigh          yes    >500 ms      0.37                   +--------------+---------+------+-----------+------------+-------------+  GSV at knee   no                            0.44                   +--------------+---------+------+-----------+------------+-------------+  GSV prox calf no                            0.33                   +--------------+---------+------+-----------+------------+-------------+  SSV Pop Fossa no                            0.32                   +--------------+---------+------+-----------+------------+-------------+  SSV prox calf no                            0.26    out of fascia  +--------------+---------+------+-----------+------------+-------------+         Summary:  Right:  - No evidence of deep vein thrombosis seen in the right lower extremity,  from the common femoral through the popliteal veins.  - No evidence of superficial venous thrombosis in the right  lower  extremity.     - No evidence of superficial venous  reflux seen in the right short  saphenous vein.    - Venous reflux is noted in the right sapheno-femoral junction.  - Venous reflux is noted in the right greater saphenous vein in the thigh.    - Right popliteal fossa heterogeneous avascular structure 2.9cm x 0.5cm x  1.7cm. Sonographic findings consistent with joint effusion vs Baker's  cyst.      Assessment/Plan:    52 year old female with C3 venous disease with multiple symptomatic varicosities right greater than left.  We evaluated both greater saphenous veins at bedside today which appeared to be amenable for cannulation on the right side there was no reflux leading to the her multiple varicosities.  We have discussed right greater saphenous vein ablation with cannulation just above the knee with 10-20 stab phlebectomies and would need sclerotherapy to treat anything that we cannot remove at the time of surgery.  She demonstrates good understanding we will continue compression stockings and we will get her scheduled in the near future.     Maeola Harman MD [facility]

## 2023-08-31 DIAGNOSIS — S46211D Strain of muscle, fascia and tendon of other parts of biceps, right arm, subsequent encounter: Secondary | ICD-10-CM | POA: Diagnosis not present

## 2023-08-31 DIAGNOSIS — M25612 Stiffness of left shoulder, not elsewhere classified: Secondary | ICD-10-CM | POA: Diagnosis not present

## 2023-08-31 DIAGNOSIS — M7542 Impingement syndrome of left shoulder: Secondary | ICD-10-CM | POA: Diagnosis not present

## 2023-08-31 DIAGNOSIS — M6281 Muscle weakness (generalized): Secondary | ICD-10-CM | POA: Diagnosis not present

## 2023-09-11 ENCOUNTER — Ambulatory Visit (INDEPENDENT_AMBULATORY_CARE_PROVIDER_SITE_OTHER): Payer: Medicare Other | Admitting: Obstetrics and Gynecology

## 2023-09-11 ENCOUNTER — Encounter: Payer: Self-pay | Admitting: Obstetrics and Gynecology

## 2023-09-11 VITALS — BP 116/88 | HR 94

## 2023-09-11 DIAGNOSIS — N3281 Overactive bladder: Secondary | ICD-10-CM | POA: Diagnosis not present

## 2023-09-11 DIAGNOSIS — M25612 Stiffness of left shoulder, not elsewhere classified: Secondary | ICD-10-CM | POA: Diagnosis not present

## 2023-09-11 DIAGNOSIS — M7542 Impingement syndrome of left shoulder: Secondary | ICD-10-CM | POA: Diagnosis not present

## 2023-09-11 DIAGNOSIS — S46211D Strain of muscle, fascia and tendon of other parts of biceps, right arm, subsequent encounter: Secondary | ICD-10-CM | POA: Diagnosis not present

## 2023-09-11 DIAGNOSIS — M6281 Muscle weakness (generalized): Secondary | ICD-10-CM | POA: Diagnosis not present

## 2023-09-11 NOTE — Progress Notes (Signed)
 Urogynecology  PTNS VISIT  CC:  Overactive bladder  52 y.o. with refractory overactive bladder who presents for percutaneous tibial nerve stimulation. The patient presents for PTNS session # 1.   Procedure: The patient was placed in the sitting position and the right lower extremity was prepped in the usual fashion. The PTNS needle was then inserted at a 60 degree angle, 5 cm cephalad and 2 cm posterior to the medial malleolus. The PTNS unit was then programmed and an optimal response was noted at 7 milliamps. The PTNS stimulation was then performed at this setting for 30 minutes without incident and the patient tolerated the procedure well. The needle was removed and hemostasis was noted.   The pt will return in 1 week for PTNS session # 2. All questions were answered.

## 2023-09-11 NOTE — Patient Instructions (Addendum)
"  Power Pudding" is a natural mixture that may help your constipation.  To make blend 1 cup applesauce, 1 cup wheat bran, and 3/4 cup prune juice, refrigerate and then take 1 tablespoon daily with a large glass of water as needed.   You can also consider adding in Pumpkin seed extract to your diet, up to 5gm daily has been show to be helpful. They make it in capsule and gummy form or you can simply add in pumpkin seeds themselves.

## 2023-09-11 NOTE — Progress Notes (Deleted)
[facility]  PTNS VISIT  CC:  Overactive bladder  52 y.o. with refractory overactive bladder who presents for percutaneous tibial nerve stimulation. The patient presents for PTNS session # 1.   Procedure: The patient was placed in the sitting position and the right lower extremity was prepped in the usual fashion. The PTNS needle was then inserted at a 60 degree angle, 5 cm cephalad and 2 cm posterior to the medial malleolus. The PTNS unit was then programmed and an optimal response was noted at 7 milliamps. The PTNS stimulation was then performed at this setting for 30 minutes without incident and the patient tolerated the procedure well. The needle was removed and hemostasis was noted.   The pt will return in 1 week for PTNS session # 2. All questions were answered.

## 2023-09-12 DIAGNOSIS — M25612 Stiffness of left shoulder, not elsewhere classified: Secondary | ICD-10-CM | POA: Diagnosis not present

## 2023-09-12 DIAGNOSIS — M6281 Muscle weakness (generalized): Secondary | ICD-10-CM | POA: Diagnosis not present

## 2023-09-12 DIAGNOSIS — M7542 Impingement syndrome of left shoulder: Secondary | ICD-10-CM | POA: Diagnosis not present

## 2023-09-12 DIAGNOSIS — S46211D Strain of muscle, fascia and tendon of other parts of biceps, right arm, subsequent encounter: Secondary | ICD-10-CM | POA: Diagnosis not present

## 2023-09-18 ENCOUNTER — Ambulatory Visit (INDEPENDENT_AMBULATORY_CARE_PROVIDER_SITE_OTHER): Payer: Medicare Other | Admitting: Obstetrics and Gynecology

## 2023-09-18 ENCOUNTER — Encounter: Payer: Self-pay | Admitting: Obstetrics and Gynecology

## 2023-09-18 VITALS — BP 101/71 | HR 79

## 2023-09-18 DIAGNOSIS — N3281 Overactive bladder: Secondary | ICD-10-CM

## 2023-09-18 DIAGNOSIS — R351 Nocturia: Secondary | ICD-10-CM

## 2023-09-18 NOTE — Progress Notes (Signed)
[facility]  PTNS VISIT  CC:  Overactive bladder  52 y.o. with refractory overactive bladder who presents for percutaneous tibial nerve stimulation. The patient presents for PTNS session # 2.  Patient is using 3-4 pads daily due to her leakage and loss of urine. Will scan bladder diary into the chart. Patient also does not drink much fluid daily (4-20oz daily).    Procedure: The patient was placed in the sitting position and the right lower extremity was prepped in the usual fashion. The PTNS needle was then inserted at a 60 degree angle, 5 cm cephalad and 2 cm posterior to the medial malleolus. The PTNS unit was then programmed and an optimal response was noted at 5 milliamps. The PTNS stimulation was then performed at this setting for 30 minutes without incident and the patient tolerated the procedure well. The needle was removed and hemostasis was noted.   The pt will return in 1 week for PTNS session # 3. All questions were answered.

## 2023-09-19 DIAGNOSIS — M7542 Impingement syndrome of left shoulder: Secondary | ICD-10-CM | POA: Diagnosis not present

## 2023-09-21 ENCOUNTER — Other Ambulatory Visit: Payer: Self-pay | Admitting: *Deleted

## 2023-09-21 DIAGNOSIS — I83811 Varicose veins of right lower extremities with pain: Secondary | ICD-10-CM

## 2023-09-21 DIAGNOSIS — I83812 Varicose veins of left lower extremities with pain: Secondary | ICD-10-CM

## 2023-09-22 DIAGNOSIS — F332 Major depressive disorder, recurrent severe without psychotic features: Secondary | ICD-10-CM | POA: Diagnosis not present

## 2023-09-22 DIAGNOSIS — F1020 Alcohol dependence, uncomplicated: Secondary | ICD-10-CM | POA: Diagnosis not present

## 2023-09-25 ENCOUNTER — Ambulatory Visit: Payer: Medicare Other | Admitting: Obstetrics and Gynecology

## 2023-09-25 ENCOUNTER — Encounter: Payer: Self-pay | Admitting: Obstetrics and Gynecology

## 2023-09-25 VITALS — BP 110/78 | HR 80

## 2023-09-25 DIAGNOSIS — R351 Nocturia: Secondary | ICD-10-CM

## 2023-09-25 DIAGNOSIS — N3281 Overactive bladder: Secondary | ICD-10-CM | POA: Diagnosis not present

## 2023-09-25 NOTE — Progress Notes (Signed)
[facility]  PTNS VISIT  CC:  Overactive bladder  52 y.o. with refractory overactive bladder who presents for percutaneous tibial nerve stimulation. The patient presents for PTNS session # 3.   Procedure: The patient was placed in the sitting position and the right lower extremity was prepped in the usual fashion. The PTNS needle was then inserted at a 60 degree angle, 5 cm cephalad and 2 cm posterior to the medial malleolus. The PTNS unit was then programmed and an optimal response was noted at 7 milliamps. The PTNS stimulation was then performed at this setting for 30 minutes without incident and the patient tolerated the procedure well. The needle was removed and hemostasis was noted.   The pt will return in 1 week for PTNS session # 4. All questions were answered.

## 2023-10-02 ENCOUNTER — Ambulatory Visit (INDEPENDENT_AMBULATORY_CARE_PROVIDER_SITE_OTHER): Payer: Medicare Other | Admitting: Obstetrics and Gynecology

## 2023-10-02 ENCOUNTER — Ambulatory Visit: Payer: Medicare Other | Admitting: Obstetrics and Gynecology

## 2023-10-02 VITALS — BP 101/70 | HR 76

## 2023-10-02 DIAGNOSIS — R351 Nocturia: Secondary | ICD-10-CM

## 2023-10-02 DIAGNOSIS — N3946 Mixed incontinence: Secondary | ICD-10-CM

## 2023-10-02 DIAGNOSIS — N3281 Overactive bladder: Secondary | ICD-10-CM

## 2023-10-02 NOTE — Progress Notes (Signed)
[facility]  PTNS VISIT  CC:  Overactive bladder  52 y.o. with refractory overactive bladder who presents for percutaneous tibial nerve stimulation. The patient presents for PTNS session # 4.  Talking to patient she describes her bowel movements as having improved and her SUI as having been the more bothersome of her symptoms. She endorses loss of urine frequently with cough and sneeze.    Procedure: The patient was placed in the sitting position and the left lower extremity was prepped in the usual fashion. The PTNS needle was then inserted at a 60 degree angle, 5 cm cephalad and 2 cm posterior to the medial malleolus. The PTNS unit was then programmed and an optimal response was noted at 7 milliamps. The PTNS stimulation was then performed at this setting for 30 minutes without incident and the patient tolerated the procedure well. The needle was removed and hemostasis was noted.    Information given to patient about urethral bulking which she is interested in. She can be scheduled for this if she would like as Dr. Olena Leatherwood discussed options with her at initial visit.   The pt will return in 1 week for PTNS session # 5. All questions were answered.

## 2023-10-03 DIAGNOSIS — M25512 Pain in left shoulder: Secondary | ICD-10-CM | POA: Diagnosis not present

## 2023-10-03 DIAGNOSIS — M79602 Pain in left arm: Secondary | ICD-10-CM | POA: Diagnosis not present

## 2023-10-04 ENCOUNTER — Ambulatory Visit: Payer: Medicare Other | Admitting: Obstetrics

## 2023-10-04 ENCOUNTER — Encounter: Payer: Self-pay | Admitting: Obstetrics

## 2023-10-04 VITALS — BP 110/77 | HR 89

## 2023-10-04 DIAGNOSIS — K5900 Constipation, unspecified: Secondary | ICD-10-CM | POA: Diagnosis not present

## 2023-10-04 DIAGNOSIS — Z9889 Other specified postprocedural states: Secondary | ICD-10-CM

## 2023-10-04 DIAGNOSIS — N941 Unspecified dyspareunia: Secondary | ICD-10-CM | POA: Diagnosis not present

## 2023-10-04 DIAGNOSIS — R351 Nocturia: Secondary | ICD-10-CM | POA: Diagnosis not present

## 2023-10-04 DIAGNOSIS — Z87440 Personal history of urinary (tract) infections: Secondary | ICD-10-CM

## 2023-10-04 DIAGNOSIS — N898 Other specified noninflammatory disorders of vagina: Secondary | ICD-10-CM

## 2023-10-04 DIAGNOSIS — N952 Postmenopausal atrophic vaginitis: Secondary | ICD-10-CM

## 2023-10-04 DIAGNOSIS — N3946 Mixed incontinence: Secondary | ICD-10-CM

## 2023-10-04 NOTE — Progress Notes (Signed)
[facility] Return Visit  SUBJECTIVE  History of Present Illness: [name] is a 52 y.o. female seen in follow-up for mixed urinary incontinence, history of recurrent UTI, history of midurethral placement and excision, nocturia, dyspareunia, constipation, and vaginal atrophy. Plan at last visit was vaginal estrogen and PTNS.   Now reports more SUI symptoms 3-4x/day managed by liners Continues Gemtesa, UUI 3-4x/month  Trying timed voids due to prior relief Caffeine reduction 2-3 cups of coffee, attempting to cutdown Underwent PTNS x 4 with no relief Pending PFPT Continue fiber supplementation, miralax with FI 2-3x/year Reports bowel movement improves in frequency with BM after PTNS with 2-3 daily bowel movements to follow, baseline 1x/week Vaginal estrogen with relief of irritation and dryness, denies pain at vaginal opening Negative Nuswab 08/18/23  Past Medical History: Patient  has a past medical history of ADD (attention deficit disorder), Anxiety, Arthropathy, unspecified, site unspecified, Chronic fatigue syndrome, Depression, Disc degeneration, Fibromyalgia, History of kidney stones, Migraines, Multilevel degenerative disc disease, Panic attack (2019), PTSD (post-traumatic stress disorder) (2019), Sleep disorder (01/08/2014), Sleep disturbance, unspecified, Symptomatic mammary hypertrophy (09/2018), and Unspecified nonpsychotic mental disorder.   Past Surgical History: She  has a past surgical history that includes Mandible surgery; Cholecystectomy; Cesarean section; Diagnostic laparoscopy; Robotic assisted total hysterectomy (N/A, 10/16/2013); Bilateral salpingectomy (Bilateral, 10/16/2013); Removal of urinary sling (N/A, 01/09/2018); Breast reduction surgery (Bilateral, 10/03/2018); and Rotator cuff repair (Left, 08/21/2023).   Medications: She has a current medication list which includes the following prescription(s): alprazolam, auvelity, estradiol,  estradiol, naltrexone, phenazopyridine, trazodone, and gemtesa.   Allergies: Patient is allergic to mirabegron, sulfa antibiotics, elavil [amitriptyline hcl], iodinated contrast media, and lyrica [pregabalin].   Social History: Patient  reports that she has never smoked. She has never used smokeless tobacco. She reports current alcohol use. She reports current drug use. Drug: Marijuana.     OBJECTIVE     Physical Exam: Vitals:   10/04/23 0922  BP: 110/77  Pulse: 89   Gen: No apparent distress, A&O x 3.  Detailed Urogynecologic Evaluation:  Deferred.       ASSESSMENT AND PLAN    [name] is a 52 y.o. with:  1. Urinary incontinence, mixed   2. Nocturia   3. Dyspareunia in female   4. Constipation, unspecified constipation type   5. History of pelvic surgery   6. History of recurrent UTI (urinary tract infection)   7. Vaginal atrophy   8. Vaginal irritation     Urinary incontinence, mixed Assessment & Plan: - UDS prior to midurethral sling in 2018 with bladder capacity, cough induced detrusor overactivity incontinence, pelvic floor dyssynergia and history of dyspareunia with endometriosis status post pelvic floor PT - discussed need to repeat UDS prior to 3rd line therapy - continues Gemtesa due to relief - avoid anticholinergics due to history of lower extremity edema from Detrol Constant, oxybutynin, Vesicare and chronic constipation - consider bladder pain syndrome due to IBS, migraine and recurrent lower urinary tract symptoms  - We discussed the symptoms of overactive bladder (OAB), which include urinary urgency, urinary frequency, nocturia, with or without urge incontinence.  While we do not know the exact etiology of OAB, several treatment options exist. We discussed management including behavioral therapy (decreasing bladder irritants, urge suppression strategies, timed voids, bladder retraining), physical therapy, medication; for refractory cases posterior tibial  nerve stimulation, sacral neuromodulation, and intravesical botulinum toxin injection.  -Encouraged timed voids and bladder training to an increase daytime urinary frequency -Continue to empty send  referral for pelvic floor PT due to pelvic floor myofascial pain on exam -Discussed need to repeat urodynamics prior to third line therapy due to history of mid urethral sling and excision - no change in urinary symptoms after PTNS x 4, however improved bowel symptoms  For refractory OAB we reviewed the procedure for intravesical Botox injection with cystoscopy in the office and reviewed the risks, benefits and alternatives of treatment including but not limited to infection, need for self-catheterization and need for repeat therapy.  We discussed that there is a 5-15% chance of needing to catheterize with Botox and that this usually resolves in a few months; however can persist for longer periods of time.  Typically Botox injections would need to be repeated every 3-12 months since this is not a permanent therapy.   We discussed the role of sacral neuromodulation and how it works. It requires a test phase, and documentation of bladder function via diary. After a successful test period, a permanent wire and generator are placed in the OR. The battery lasts 5 years on average and would need to be replaced surgically.  The goal of this therapy is at least a 50% improvement in symptoms. It is NOT realistic to expect a 100% cure.  We reviewed the fact that about 30% of patients fail the test phase and are not candidates for permanent generator placement.  We discussed the risk of infection and that the patient would not be able to get an MRI once the device is placed. There are two companies that provide this therapy: [employer] and [employer]. [employer]' product is new and is similar to [employer]'s, but has advantages of a smaller and rechargeable battery and being able to have an MRI with the implant. For all procedures,  we discussed risks of bleeding, infection, damage to surrounding organs including bowel, bladder, blood vessels, ureters and nerves, need for further surgery, risk of postoperative urinary incontinence or retention with need to catheterize, recurrent prolapse, numbness and weakness at any body site, buttock pain, and the rarer risks of blood clot, heart attack, pneumonia, death.    - positive cough stress test for stress urinary incontinence - For treatment of stress urinary incontinence,  non-surgical options include expectant management, weight loss, physical therapy, as well as a pessary.  Surgical options include a midurethral sling, Burch urethropexy, and transurethral injection of a bulking agent. She prefers an office procedure with urethral bulking (Bulkamid). We discussed success rate of approximately 70-80% and possible need for second injection. We reviewed that this is not a permanent procedure and the Bulkamid does dissolve over time. Risks reviewed including injury to bladder or urethra, UTI, urinary retention and hematuria.    Nocturia Assessment & Plan: - avoid fluid intake after 6pm - timed voids during daytime - continue reduce caffeine intake - history of sleep disorder    Dyspareunia in female Assessment & Plan: - History of pelvic floor PT with relief in 2015 - Bilateral pelvic floor myofascial pain on prior exam - Encouraged to optimize stool consistency - continue low-dose vaginal estrogen for atrophy - pending pelvic floor PT scheduled to resume - No enlargement of Bartholin cyst on exam   Constipation, unspecified constipation type Assessment & Plan: - improved with PTNS for 2-3 days after therapy - baseline 1x/week, encouraged to continue titration of miralax to ensure bowel consistency. - pending pelvic floor PT  - discussed impact on urinary symptoms - For constipation, we reviewed the importance of a better bowel regimen.  We also discussed the importance  of avoiding chronic straining, as it can exacerbate her pelvic floor symptoms; we discussed treating constipation and straining prior to surgery, as postoperative straining can lead to damage to the repair and recurrence of symptoms. We discussed initiating therapy with increasing fluid intake, fiber supplementation, stool softeners, and laxatives such as miralax.   History of pelvic surgery Assessment & Plan: - Midurethral sling placed in 08/11/17 with mesh excision 01/09/18 by Dr. Sherron Monday - Prior postvoid residual 18 mL - Discussed need for urodynamics prior to third line therapy with periurethral bulking, pending schedule   History of recurrent UTI (urinary tract infection) Assessment & Plan: - prior POCT with + uro  - For treatment of recurrent urinary tract infections, we discussed management of recurrent UTIs including prophylaxis with a daily low dose antibiotic, transvaginal estrogen therapy, D-mannose, and cranberry supplements.  We discussed the role of diagnostic testing such as cystoscopy and upper tract imaging.   - Rx Pyridium and start ibuprofen as needed UTI symptoms, encouraged to avoid antibiotic exposure if unclear lower urinary tract symptoms - continue vaginal estrogen 0.5 to 1 g twice a week - NuSwab negative for infection - Discussed risk of resistant uropathogens with repeated antibiotic exposure   Vaginal atrophy Assessment & Plan: - For symptomatic vaginal atrophy options include lubrication with a water-based lubricant, personal hygiene measures and barrier protection against wetness, and estrogen replacement in the form of vaginal cream, vaginal tablets, or a time-released vaginal ring.   - continue vaginal estrogen 1g twice a week   Vaginal irritation Assessment & Plan: - Nuswab negative 08/18/23 - multiple recent antibiotic courses for UTI - continuet vaginal estrogen due to resolution of symptoms   Time spent: I spent 28 minutes dedicated to the care  of this patient on the date of this encounter to include pre-visit review of records, face-to-face time with the patient discussing mixed urinary incontinence, history of recurrent UTI, vaginal atrophy, dyspareunia, constipation, and post visit documentation and ordering medication/ testing.    Loleta Chance, MD

## 2023-10-04 NOTE — Assessment & Plan Note (Signed)
- UDS prior to midurethral sling in 2018 with bladder capacity, cough induced detrusor overactivity incontinence, pelvic floor dyssynergia and history of dyspareunia with endometriosis status post pelvic floor PT - discussed need to repeat UDS prior to 3rd line therapy - continues Gemtesa due to relief - avoid anticholinergics due to history of lower extremity edema from Detrol Constant, oxybutynin, Vesicare and chronic constipation - consider bladder pain syndrome due to IBS, migraine and recurrent lower urinary tract symptoms  - We discussed the symptoms of overactive bladder (OAB), which include urinary urgency, urinary frequency, nocturia, with or without urge incontinence.  While we do not know the exact etiology of OAB, several treatment options exist. We discussed management including behavioral therapy (decreasing bladder irritants, urge suppression strategies, timed voids, bladder retraining), physical therapy, medication; for refractory cases posterior tibial nerve stimulation, sacral neuromodulation, and intravesical botulinum toxin injection.  -Encouraged timed voids and bladder training to an increase daytime urinary frequency -Continue to empty send referral for pelvic floor PT due to pelvic floor myofascial pain on exam -Discussed need to repeat urodynamics prior to third line therapy due to history of mid urethral sling and excision - no change in urinary symptoms after PTNS x 4, however improved bowel symptoms  For refractory OAB we reviewed the procedure for intravesical Botox injection with cystoscopy in the office and reviewed the risks, benefits and alternatives of treatment including but not limited to infection, need for self-catheterization and need for repeat therapy.  We discussed that there is a 5-15% chance of needing to catheterize with Botox and that this usually resolves in a few months; however can persist for longer periods of time.  Typically Botox injections would need to  be repeated every 3-12 months since this is not a permanent therapy.   We discussed the role of sacral neuromodulation and how it works. It requires a test phase, and documentation of bladder function via diary. After a successful test period, a permanent wire and generator are placed in the OR. The battery lasts 5 years on average and would need to be replaced surgically.  The goal of this therapy is at least a 50% improvement in symptoms. It is NOT realistic to expect a 100% cure.  We reviewed the fact that about 30% of patients fail the test phase and are not candidates for permanent generator placement.  We discussed the risk of infection and that the patient would not be able to get an MRI once the device is placed. There are two companies that provide this therapy: [employer] and [employer]. [employer]' product is new and is similar to [employer]'s, but has advantages of a smaller and rechargeable battery and being able to have an MRI with the implant. For all procedures, we discussed risks of bleeding, infection, damage to surrounding organs including bowel, bladder, blood vessels, ureters and nerves, need for further surgery, risk of postoperative urinary incontinence or retention with need to catheterize, recurrent prolapse, numbness and weakness at any body site, buttock pain, and the rarer risks of blood clot, heart attack, pneumonia, death.    - positive cough stress test for stress urinary incontinence - For treatment of stress urinary incontinence,  non-surgical options include expectant management, weight loss, physical therapy, as well as a pessary.  Surgical options include a midurethral sling, Burch urethropexy, and transurethral injection of a bulking agent. She prefers an office procedure with urethral bulking (Bulkamid). We discussed success rate of approximately 70-80% and possible need for second injection. We reviewed that  this is not a permanent procedure and the Bulkamid does dissolve over  time. Risks reviewed including injury to bladder or urethra, UTI, urinary retention and hematuria.

## 2023-10-04 NOTE — Assessment & Plan Note (Signed)
-   prior POCT with + uro  - For treatment of recurrent urinary tract infections, we discussed management of recurrent UTIs including prophylaxis with a daily low dose antibiotic, transvaginal estrogen therapy, D-mannose, and cranberry supplements.  We discussed the role of diagnostic testing such as cystoscopy and upper tract imaging.   - Rx Pyridium and start ibuprofen as needed UTI symptoms, encouraged to avoid antibiotic exposure if unclear lower urinary tract symptoms - continue vaginal estrogen 0.5 to 1 g twice a week - NuSwab negative for infection - Discussed risk of resistant uropathogens with repeated antibiotic exposure

## 2023-10-04 NOTE — Assessment & Plan Note (Signed)
-   History of pelvic floor PT with relief in 2015 - Bilateral pelvic floor myofascial pain on prior exam - Encouraged to optimize stool consistency - continue low-dose vaginal estrogen for atrophy - pending pelvic floor PT scheduled to resume - No enlargement of Bartholin cyst on exam

## 2023-10-04 NOTE — Assessment & Plan Note (Signed)
-   For symptomatic vaginal atrophy options include lubrication with a water-based lubricant, personal hygiene measures and barrier protection against wetness, and estrogen replacement in the form of vaginal cream, vaginal tablets, or a time-released vaginal ring.   - continue vaginal estrogen 1g twice a week

## 2023-10-04 NOTE — Patient Instructions (Signed)
Please return with a full bladder for urodynamic testing.   Continue to increase your miralax dosing until 3x/week bowel movements.  Good luck with your pelvic floor PT.  For refractory OAB we reviewed the procedure for intravesical Botox injection with cystoscopy in the office and reviewed the risks, benefits and alternatives of treatment including but not limited to infection, need for self-catheterization and need for repeat therapy.  We discussed that there is a 5-15% chance of needing to catheterize with Botox and that this usually resolves in a few months; however can persist for longer periods of time.  Typically Botox injections would need to be repeated every 3-12 months since this is not a permanent therapy.   We discussed the role of sacral neuromodulation and how it works. It requires a test phase, and documentation of bladder function via diary. After a successful test period, a permanent wire and generator are placed in the OR. The battery lasts 5 years on average and would need to be replaced surgically.  The goal of this therapy is at least a 50% improvement in symptoms. It is NOT realistic to expect a 100% cure.  We reviewed the fact that about 30% of patients fail the test phase and are not candidates for permanent generator placement.  We discussed the risk of infection and that the patient would not be able to get an MRI once the device is placed. There are two companies that provide this therapy: [employer] and [employer]. [employer]' product is new and is similar to [employer]'s, but has advantages of a smaller and rechargeable battery and being able to have an MRI with the implant. For all procedures, we discussed risks of bleeding, infection, damage to surrounding organs including bowel, bladder, blood vessels, ureters and nerves, need for further surgery, risk of postoperative urinary incontinence or retention with need to catheterize, recurrent prolapse, numbness and weakness at any body  site, buttock pain, and the rarer risks of blood clot, heart attack, pneumonia, death.    We also discussed the role of percutaneous tibial nerve stimulation and how it works.  She understands it requires 12 weekly visits for temporary neuromodulation of the sacral nerve roots via the tibial nerve and that she may then require continued tapered treatment.  She will return for the procedure. All questions were answered.  She prefers an office procedure with urethral bulking (Bulkamid). We discussed success rate of approximately 70-80% and possible need for second injection. We reviewed that this is not a permanent procedure and the Bulkamid does dissolve over time. Risks reviewed including injury to bladder or urethra, UTI, urinary retention and hematuria.

## 2023-10-04 NOTE — Assessment & Plan Note (Signed)
-   improved with PTNS for 2-3 days after therapy - baseline 1x/week, encouraged to continue titration of miralax to ensure bowel consistency. - pending pelvic floor PT  - discussed impact on urinary symptoms - For constipation, we reviewed the importance of a better bowel regimen.  We also discussed the importance of avoiding chronic straining, as it can exacerbate her pelvic floor symptoms; we discussed treating constipation and straining prior to surgery, as postoperative straining can lead to damage to the repair and recurrence of symptoms. We discussed initiating therapy with increasing fluid intake, fiber supplementation, stool softeners, and laxatives such as miralax.

## 2023-10-04 NOTE — Assessment & Plan Note (Signed)
-   Nuswab negative 08/18/23 - multiple recent antibiotic courses for UTI - continuet vaginal estrogen due to resolution of symptoms

## 2023-10-04 NOTE — Assessment & Plan Note (Signed)
-   avoid fluid intake after 6pm - timed voids during daytime - continue reduce caffeine intake - history of sleep disorder

## 2023-10-04 NOTE — Assessment & Plan Note (Signed)
-   Midurethral sling placed in 08/11/17 with mesh excision 01/09/18 by Dr. Sherron Monday - Prior postvoid residual 18 mL - Discussed need for urodynamics prior to third line therapy with periurethral bulking, pending schedule

## 2023-10-05 DIAGNOSIS — F332 Major depressive disorder, recurrent severe without psychotic features: Secondary | ICD-10-CM | POA: Diagnosis not present

## 2023-10-05 DIAGNOSIS — M79602 Pain in left arm: Secondary | ICD-10-CM | POA: Diagnosis not present

## 2023-10-05 DIAGNOSIS — M25512 Pain in left shoulder: Secondary | ICD-10-CM | POA: Diagnosis not present

## 2023-10-05 DIAGNOSIS — M6281 Muscle weakness (generalized): Secondary | ICD-10-CM | POA: Diagnosis not present

## 2023-10-05 DIAGNOSIS — F1020 Alcohol dependence, uncomplicated: Secondary | ICD-10-CM | POA: Diagnosis not present

## 2023-10-12 ENCOUNTER — Ambulatory Visit: Payer: Medicare Other | Admitting: Obstetrics and Gynecology

## 2023-10-13 ENCOUNTER — Ambulatory Visit: Payer: Medicare Other | Admitting: Obstetrics and Gynecology

## 2023-10-19 ENCOUNTER — Other Ambulatory Visit: Payer: Self-pay | Admitting: *Deleted

## 2023-10-19 DIAGNOSIS — M6281 Muscle weakness (generalized): Secondary | ICD-10-CM | POA: Diagnosis not present

## 2023-10-19 DIAGNOSIS — M79602 Pain in left arm: Secondary | ICD-10-CM | POA: Diagnosis not present

## 2023-10-19 DIAGNOSIS — M25512 Pain in left shoulder: Secondary | ICD-10-CM | POA: Diagnosis not present

## 2023-10-19 DIAGNOSIS — F332 Major depressive disorder, recurrent severe without psychotic features: Secondary | ICD-10-CM | POA: Diagnosis not present

## 2023-10-19 DIAGNOSIS — F1020 Alcohol dependence, uncomplicated: Secondary | ICD-10-CM | POA: Diagnosis not present

## 2023-10-19 MED ORDER — LORAZEPAM 1 MG PO TABS
ORAL_TABLET | ORAL | 0 refills | Status: AC
Start: 2023-10-19 — End: ?

## 2023-10-20 ENCOUNTER — Ambulatory Visit: Payer: Medicare Other | Admitting: Obstetrics and Gynecology

## 2023-10-23 DIAGNOSIS — M79602 Pain in left arm: Secondary | ICD-10-CM | POA: Diagnosis not present

## 2023-10-23 DIAGNOSIS — M25512 Pain in left shoulder: Secondary | ICD-10-CM | POA: Diagnosis not present

## 2023-10-23 DIAGNOSIS — M6281 Muscle weakness (generalized): Secondary | ICD-10-CM | POA: Diagnosis not present

## 2023-10-25 ENCOUNTER — Ambulatory Visit: Payer: Medicare Other | Admitting: Obstetrics and Gynecology

## 2023-10-25 VITALS — BP 145/84 | HR 80

## 2023-10-25 DIAGNOSIS — R350 Frequency of micturition: Secondary | ICD-10-CM

## 2023-10-25 DIAGNOSIS — N393 Stress incontinence (female) (male): Secondary | ICD-10-CM

## 2023-10-25 DIAGNOSIS — N3281 Overactive bladder: Secondary | ICD-10-CM

## 2023-10-25 LAB — POCT URINALYSIS DIPSTICK
Bilirubin, UA: NEGATIVE
Blood, UA: NEGATIVE
Glucose, UA: NEGATIVE
Ketones, UA: NEGATIVE
Nitrite, UA: NEGATIVE
Protein, UA: NEGATIVE
Spec Grav, UA: 1.025 (ref 1.010–1.025)
Urobilinogen, UA: 0.2 U/dL
pH, UA: 5.5 (ref 5.0–8.0)

## 2023-10-26 ENCOUNTER — Ambulatory Visit: Payer: Medicare Other | Admitting: Vascular Surgery

## 2023-10-26 ENCOUNTER — Encounter: Payer: Self-pay | Admitting: Obstetrics and Gynecology

## 2023-10-26 ENCOUNTER — Encounter: Payer: Self-pay | Admitting: Vascular Surgery

## 2023-10-26 VITALS — BP 107/71 | HR 106 | Temp 98.2°F | Resp 18 | Ht 62.0 in | Wt 186.0 lb

## 2023-10-26 DIAGNOSIS — I83811 Varicose veins of right lower extremities with pain: Secondary | ICD-10-CM | POA: Diagnosis not present

## 2023-10-26 HISTORY — PX: ENDOVENOUS ABLATION SAPHENOUS VEIN W/ LASER: SUR449

## 2023-10-26 NOTE — Progress Notes (Signed)
 Patient name: [name] MRN: [id number] DOB: 10-Mar-1971 Sex: female  REASON FOR VISIT: Treatment of right lower extremity chronic venous insufficiency symptomatic varicosities and reticular veins  HPI: [name] is a 53 y.o. female with history of C3 venous disease with multiple varicosities and more reticular veins of the right lower extremity.  We have discussed proceeding with right greater saphenous vein ablation as well as stab phlebectomy between 10 and 20 and on follow-up she will require sclerotherapy.  She has been compliant with thigh-high compression stockings.  Current Outpatient Medications  Medication Sig Dispense Refill   ALPRAZolam (XANAX) 1 MG tablet 1 mg 3 (three) times daily.     AUVELITY 45-105 MG TBCR Take 1 tablet by mouth every morning.     estradiol  (ESTRACE ) 0.1 MG/GM vaginal cream Place 0.5 g vaginally 2 (two) times a week. Place 0.5g nightly for two weeks then twice a week after 30 g 11   estradiol  (VIVELLE -DOT) 0.05 MG/24HR patch Place 1 patch onto the skin 2 (two) times a week.     LORazepam  (ATIVAN ) 1 MG tablet Take 2 tablets 30 to 60 minutes prior to leaving house on day of office surgery.  Bring third tablet with you to office on day of office surgery. 3 tablet 0   naltrexone (DEPADE) 50 MG tablet Take 50 mg by mouth daily as needed.     phenazopyridine  (PYRIDIUM ) 95 MG tablet Take 1 tablet (95 mg total) by mouth 3 (three) times daily as needed for pain. 30 tablet 1   traZODone  (DESYREL ) 100 MG tablet Take 100 mg by mouth at bedtime.     Vibegron (GEMTESA) 75 MG TABS Take by mouth.     No current facility-administered medications for this visit.    PHYSICAL EXAM: Vitals:   10/26/23 1120  BP: 107/71  Pulse: (!) 106  Resp: 18  Temp: 98.2 F (36.8 C)  SpO2: 97%   Awake alert and oriented Nonlabored respirations Right lower extremity varicosities and larger reticular veins marked with the patient  standing  PROCEDURE: Saphenous vein ablation to 26 cm and stab phlebectomy of multiple varicosities and large reticular veins of the anterior and lateral thigh as well as medial and lateral leg between 10 and 20  TECHNIQUE: Mrs. Scarboro was taken procedure room where she was initially placed standing and her large varicosities and reticular veins were marked on the skin.  She was then laid supine and sterilely prepped and draped circumferentially in the right lower extremity and timeout was called.  We began using ultrasound and cannulated the greater saphenous vein just above the knee with a micropuncture needle followed by wire sheath.  We then placed a J-wire within 3 cm of the saphenofemoral junction and placed a laser introducer sheath to the level of the laser to a total of 26 cm.  The length of the laser was then anesthetized with tumescent anesthesia the laser was prepared glasses were placed on all participants in the room and the vein was ablated for a total of 26 cm.  At completion the common femoral vein was noted to be patent and compressible.  We then turned our attention to the multiple large reticular veins of the anterior and lateral thigh as well as varicosities of the lateral thigh leg and more reticular veins of the medial leg.  These areas were anesthetized with 1% lidocaine  stab incisions were created and the veins were removed with a combination of  shaver and hemostat.  At completion we placed Steri-Strips followed by compressive wrap.  She tolerated the procedure well without any complication.  Plan will be to follow-up in 2 weeks with post ablation duplex and evaluate need for sclerotherapy moving forward.  [facility] [phone]

## 2023-10-26 NOTE — Progress Notes (Signed)
 [facility] Urodynamics Procedure  Referring Physician: Onita Rush, MD Date of Procedure: 10/25/2023  [name] is a 53 y.o. female who presents for urodynamic evaluation. Indication(s) for study: SUI and OAB  Vital Signs: BP (!) 145/84   Pulse 80   LMP 10/05/2013   Laboratory Results: A clean catch urine specimen revealed:  Lab Results  Component Value Date   COLORU yellow 10/25/2023   CLARITYU clear 10/25/2023   GLUCOSEUR Negative 10/25/2023   BILIRUBINUR negative 10/25/2023   KETONESU negative 10/25/2023   SPECGRAV 1.025 10/25/2023   RBCUR negative 10/25/2023   PHUR 5.5 10/25/2023   PROTEINUR Negative 10/25/2023   UROBILINOGEN 0.2 10/25/2023   LEUKOCYTESUR Small (1+) (A) 10/25/2023      Voiding Diary: Not done  Procedure Timeout:  The correct patient was verified and the correct procedure was verified. The patient was in the correct position and safety precautions were reviewed based on at the patient's history.  Urodynamic Procedure A 66F dual lumen urodynamics catheter was placed under sterile conditions into the patient's bladder. A 66F catheter was placed into the rectum in order to measure abdominal pressure. EMG patches were placed in the appropriate position.  All connections were confirmed and calibrations/adjusted made. Saline was instilled into the bladder through the dual lumen catheters.  Cough/valsalva pressures were measured periodically during filling.  Patient was allowed to void.  The bladder was then emptied of its residual.  UROFLOW: Revealed a Qmax of 30.4 mL/sec.  She voided 187 mL and had a residual of 5 mL.  It was a normal pattern and represented normal habits though interpretation limited due to low voided volume.  CMG: This was performed with sterile water  in the sitting position at a fill rate of 30  mL/min.    First sensation of fullness was 68 mLs,  First urge was 140 mLs,  Strong urge was 166 mLs and   Capacity was 196 mLs  Stress incontinence was demonstrated Lowest positive CLPP was 125 cmH20 at 211 ml. Lowest positive VLPP was 87 cmH20at 177 ml.   Detrusor function was normal during filling, but with each check for stress incontinence, there was a terminal DO with complete loss of urine.   Compliance:  Normal. End fill detrusor pressure was 0cmH20.    UPP: MUCP without barrier reduction was 61 cm of water .    MICTURITION STUDY: Voiding was performed without reduction in the sitting position.  Pdet at Qmax was 19 cm of water .  Qmax was 16 mL/sec.  It was a normal pattern.  She voided 191 mL and had a residual of 5 mL.  It was not a volitional void as it was a terminal DO occurring with valsalva, sustained detrusor contraction was present and abdominal straining was not present  EMG: This was performed with patches.  She had voluntary contractions, recruitment with fill was present and urethral sphincter was not relaxed with void.  The details of the procedure with the study tracings have been scanned into EPIC.   Urodynamic Impression:  1. Sensation was increased; capacity was reduced 2. Stress Incontinence was demonstrated at normal pressures; 3. 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  4. Emptying was dysfunctional with a normal PVR, a sustained detrusor contraction present,  abdominal straining not present, dyssynergic urethral sphincter activity on EMG.  Plan: - The patient will follow up  to discuss the findings and treatment options.

## 2023-10-26 NOTE — Progress Notes (Addendum)
     Laser Ablation Procedure    Date: 10/26/2023 [name] DOB:06-27-1971 Consent signed: Yes     Surgeon: Dr. Penne [place]  Procedure: Laser Ablation: right Greater Saphenous Vein  BP 107/71 (BP Location: Left Arm, Patient Position: Sitting, Cuff Size: Normal)   Pulse (!) 106   Temp 98.2 F (36.8 C) (Temporal)   Resp 18   Ht 5' 2 (1.575 m)   Wt 186 lb (84.4 kg)   LMP 10/05/2013   SpO2 97%   BMI 34.02 kg/m   Tumescent Anesthesia: 700 cc 0.9% NaCl with 50 cc Lidocaine  HCL 1%  and 15 cc 8.4% NaHCO3 Local Anesthesia: 25 cc Lidocaine  HCL and NaHCO3 (ratio 2:1) 7 watts continuous mode     Total energy: 1305.8 joules    Total time: 186 seconds Treatment Length  26 cm Laser Fiber Ref. #   [id number]      Lot # [id number]  Stab Phlebectomy: 10-20 Sites: Thigh and Calf  Patient tolerated procedure well  Notes:  All staff members wore facial masks. Pt has 2 tabs (1 Mg ) ativan  at 10:00 am  making a total of 2 mg prior to the surgical procedure and another 1mg  (1 tab) at 11:43 am  Description of Procedure:\ After marking the course of the secondary varicosities, the patient was placed on the operating table in the supine position, and the right leg was prepped and draped in sterile fashion.   Local anesthetic was administered and under ultrasound guidance the saphenous vein was accessed with a micro needle and guide wire; then the mirco puncture sheath was placed.  A guide wire was inserted saphenofemoral junction , followed by a 5 french sheath.  The position of the sheath and then the laser fiber below the junction was confirmed using the ultrasound.  Tumescent anesthesia was administered along the course of the saphenous vein using ultrasound guidance. The patient was placed in Trendelenburg position and protective laser glasses were placed on patient and staff, and the laser was fired at 7 watts continuous mode for a total of 1305.8 joules.   For stab phlebectomies, local  anesthetic was administered at the previously marked varicosities, and tumescent anesthesia was administered around the vessels.  Ten to 20 stab wounds were made using the tip of an 11 blade. And using the vein hook, the phlebectomies were performed using a hemostat to avulse the varicosities.  Adequate hemostasis was achieved.   Steri strips were applied to the stab wounds and ABD pads and thigh high compression stockings were applied.  Ace wrap bandages were applied over the phlebectomy sites and at the top of the saphenofemoral junction. Blood loss was less than 15 cc.  Discharge instructions reviewed with patient and hardcopy of discharge instructions given to patient to take home. The patient ambulated out of the operating room having tolerated the procedure well.

## 2023-10-27 ENCOUNTER — Other Ambulatory Visit (INDEPENDENT_AMBULATORY_CARE_PROVIDER_SITE_OTHER): Payer: Self-pay | Admitting: Obstetrics and Gynecology

## 2023-10-27 DIAGNOSIS — N393 Stress incontinence (female) (male): Secondary | ICD-10-CM | POA: Diagnosis not present

## 2023-10-27 DIAGNOSIS — N3281 Overactive bladder: Secondary | ICD-10-CM

## 2023-10-27 MED ORDER — NITROFURANTOIN MONOHYD MACRO 100 MG PO CAPS
100.0000 mg | ORAL_CAPSULE | Freq: Two times a day (BID) | ORAL | 0 refills | Status: DC
Start: 2023-10-27 — End: 2023-11-01

## 2023-10-27 NOTE — Progress Notes (Signed)
 Patient reports irritation due to urodynamics. Due to impending weather will send in antibiotics.

## 2023-11-01 ENCOUNTER — Ambulatory Visit: Payer: Medicare Other | Admitting: Obstetrics

## 2023-11-01 ENCOUNTER — Ambulatory Visit (INDEPENDENT_AMBULATORY_CARE_PROVIDER_SITE_OTHER): Payer: Medicare Other | Admitting: Obstetrics

## 2023-11-01 ENCOUNTER — Encounter: Payer: Self-pay | Admitting: Obstetrics

## 2023-11-01 VITALS — BP 129/86 | HR 78

## 2023-11-01 DIAGNOSIS — K5900 Constipation, unspecified: Secondary | ICD-10-CM

## 2023-11-01 DIAGNOSIS — N3946 Mixed incontinence: Secondary | ICD-10-CM

## 2023-11-01 DIAGNOSIS — N941 Unspecified dyspareunia: Secondary | ICD-10-CM | POA: Diagnosis not present

## 2023-11-01 DIAGNOSIS — Z87440 Personal history of urinary (tract) infections: Secondary | ICD-10-CM

## 2023-11-01 DIAGNOSIS — R351 Nocturia: Secondary | ICD-10-CM | POA: Diagnosis not present

## 2023-11-01 DIAGNOSIS — Z9889 Other specified postprocedural states: Secondary | ICD-10-CM

## 2023-11-01 DIAGNOSIS — N952 Postmenopausal atrophic vaginitis: Secondary | ICD-10-CM | POA: Diagnosis not present

## 2023-11-01 MED ORDER — NITROFURANTOIN MONOHYD MACRO 100 MG PO CAPS
100.0000 mg | ORAL_CAPSULE | Freq: Every day | ORAL | 0 refills | Status: AC
Start: 2023-11-01 — End: 2023-11-04

## 2023-11-01 NOTE — Assessment & Plan Note (Signed)
-   prior POCT with + uro  - For treatment of recurrent urinary tract infections, we discussed management of recurrent UTIs including prophylaxis with a daily low dose antibiotic, transvaginal estrogen therapy, D-mannose, and cranberry supplements.  We discussed the role of diagnostic testing such as cystoscopy and upper tract imaging.   - Rx Pyridium  and start ibuprofen as needed UTI symptoms, encouraged to avoid antibiotic exposure if unclear lower urinary tract symptoms - continue vaginal estrogen 0.5 to 1 g twice a week - NuSwab negative for infection - Discussed risk of resistant uropathogens with repeated antibiotic exposure - Rx macrobid  to start 3 days prior to procedure

## 2023-11-01 NOTE — Assessment & Plan Note (Signed)
-   avoid fluid intake after 6pm - timed voids during daytime - continue reduce caffeine intake - history of sleep disorder - continue Gemtesa

## 2023-11-01 NOTE — Patient Instructions (Addendum)
 Start macrobid  100mg  once daily 3 days prior to your procedure.   Continue Gemtesa daily.   Continue vaginal estrogen 1g twice a week.   You preferred an office procedure with urethral bulking (Bulkamid). We discussed success rate of approximately 70-80% and possible need for second injection. We reviewed that this is not a permanent procedure and the Bulkamid does dissolve over time. Risks reviewed including injury to bladder or urethra, UTI, urinary retention and hematuria.   - For refractory OAB we reviewed the procedure for intravesical Botox  injection with cystoscopy in the office and reviewed the risks, benefits and alternatives of treatment including but not limited to infection, need for self-catheterization and need for repeat therapy.   - We discussed that there is a 5-15% chance of needing to catheterize with Botox  and that this usually resolves in a few months; however can persist for longer periods of time.   - Typically Botox  injections would need to be repeated every 3-12 months since this is not a permanent therapy. She will return for the procedure.   Proceed with pelvic floor PT.

## 2023-11-01 NOTE — Progress Notes (Signed)
 [facility] Return Visit  SUBJECTIVE  History of Present Illness: [name] is a 53 y.o. female seen in follow-up for mixed urinary incontinence, dyspareunia, constipation, nocturia, history of recurrent UTI, vaginal atrophy, and history of midurethral sling. Plan at last visit was [facility] and PTNS.   Some relief with [name] Now more bothered by SUI symptoms managed with 3-4 panty liners Continues Gemtesa, UUI 3-4x/month  Trying timed voids due to prior relief Caffeine reduction 2-3 cups of coffee, attempting to cutdown Pending PFPT 11/24/23 Continue fiber supplementation, miralax with FI 2-3x/year Reports bowel movement improves in frequency with BM after PTNS x 4 with 2-3 daily bowel movements to follow, baseline 1x/week Vaginal estrogen with relief of irritation and dryness, denies pain at vaginal opening Negative Nuswab 08/18/23 Receives facial botox , last 09/2023 around 50U per patient.   Urodynamic Impression 10/25/23:  1. Sensation was increased; capacity was reduced 2. Stress Incontinence was demonstrated at normal pressures; MUCP 61 3. 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  4. Emptying was dysfunctional with a normal PVR 5mL, a sustained detrusor contraction present,  abdominal straining not present, dyssynergic urethral sphincter activity on EMG. 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   Past Surgical History: She  has a past surgical history that includes Mandible surgery; Cholecystectomy; Cesarean section; Diagnostic laparoscopy; Robotic assisted total hysterectomy (N/A, 10/16/2013); Bilateral salpingectomy (Bilateral, 10/16/2013); Removal of urinary sling (N/A, 01/09/2018); Breast reduction surgery (Bilateral, 10/03/2018); Rotator cuff repair (Left, 08/21/2023); and Endovenous ablation saphenous vein w/ laser (Right, 10/26/2023).   Medications: She has a current medication list which includes the following prescription(s): alprazolam, auvelity, estradiol , estradiol , naltrexone, phenazopyridine , trazodone , gemtesa, and nitrofurantoin  (macrocrystal-monohydrate).   Allergies: Patient is allergic to mirabegron, sulfa antibiotics, elavil [amitriptyline hcl], iodinated contrast media, and lyrica [pregabalin].   Social History: Patient  reports that she has never smoked. She has never used smokeless tobacco. She reports current alcohol use. She reports current drug use. Drug: Marijuana.     OBJECTIVE     Physical Exam: Vitals:   11/01/23 1421  BP: 129/86  Pulse: 78   Gen: No apparent distress, A&O x 3.  Detailed Urogynecologic Evaluation:  Deferred.       No data to display             ASSESSMENT AND PLAN    [name] is a 53 y.o. with:  1. Urinary incontinence, mixed   2. History of pelvic surgery   3. History of recurrent UTI (urinary tract infection)   4. Vaginal atrophy   5. Nocturia   6. Constipation, unspecified constipation type   7. Dyspareunia in female     Urinary incontinence, mixed Assessment & Plan: - UDS prior to midurethral sling in 2018 with bladder capacity, cough induced detrusor overactivity incontinence, pelvic floor dyssynergia and history of dyspareunia with endometriosis s/p pelvic floor PT - UDS 10/25/23 showed + CST, + terminal DO, normal pressure flow and PVR with reduced MCC - continues Gemtesa due to relief, can  discontinue after 2-3 weeks of botox  injection - avoid anticholinergics due  to history of lower extremity edema from Detrol Constant, oxybutynin, Vesicare and chronic constipation - consider bladder pain syndrome due to IBS, migraine and recurrent lower urinary tract symptoms  - We discussed the symptoms of overactive bladder (OAB), which include urinary urgency, urinary frequency, nocturia, with or without urge incontinence.  While we do not know the exact etiology of OAB, several treatment options exist. We discussed management including behavioral therapy (decreasing bladder irritants, urge suppression strategies, timed voids, bladder retraining), physical therapy, medication; for refractory cases posterior tibial nerve stimulation, sacral neuromodulation, and intravesical botulinum toxin injection.  -Encouraged timed voids and bladder training to an increase daytime urinary frequency - pending pelvic floor PT due to pelvic floor myofascial pain on exam - desires to avoid MUS due to history of mid urethral sling and excision - no change in urinary symptoms after PTNS x 4, however improved bowel symptoms  - For refractory OAB we reviewed the procedure for intravesical Botox  injection with cystoscopy in the office and reviewed the risks, benefits and alternatives of treatment including but not limited to infection, need for self-catheterization and need for repeat therapy.  We discussed that there is a 5-15% chance of needing to catheterize with Botox  and that this usually resolves in a few months; however can persist for longer periods of time.  Typically Botox  injections would need to be repeated every 3-12 months since this is not a permanent therapy. Desires to proceed  We discussed the role of sacral neuromodulation and how it works. It requires a test phase, and documentation of bladder function via diary. After a successful test period, a permanent wire and generator are placed in the OR. The  battery lasts 5 years on average and would need to be replaced surgically.  The goal of this therapy is at least a 50% improvement in symptoms. It is NOT realistic to expect a 100% cure.  We reviewed the fact that about 30% of patients fail the test phase and are not candidates for permanent generator placement.  We discussed the risk of infection and that the patient would not be able to get an MRI once the device is placed. There are two companies that provide this therapy: Medtr[employer] and [employer]. [employer]' product is new and is similar to Medtronic's, but has advantages of a smaller and rechargeable battery and being able to have an MRI with the implant. For all procedures, we discussed risks of bleeding, infection, damage to surrounding organs including bowel, bladder, blood vessels, ureters and nerves, need for further surgery, risk of postoperative urinary incontinence or retention with need to catheterize, recurrent prolapse, numbness and weakness at any body site, buttock pain, and the rarer risks of blood clot, heart attack, pneumonia, death.    - positive cough stress test for stress urinary incontinence - For treatment of stress urinary incontinence,  non-surgical options include expectant management, weight loss, physical therapy, as well as a pessary.  Surgical options include a midurethral sling, Burch urethropexy, and transurethral injection of a bulking agent. She prefers an office procedure with urethral bulking (Bulkamid). We discussed success rate of approximately 70-80% and possible need for second injection. We reviewed that this is not a permanent procedure and the Bulkamid does dissolve over time. Risks reviewed including injury to bladder or urethra, UTI, urinary retention and hematuria.    History of pelvic surgery Assessment & Plan: - Midurethral sling placed in 08/11/17 with mesh excision 01/09/18 by Dr. Clarke Crouch - Prior postvoid residual 18 mL - urodynamics  10/25/23 with + CST,  normal Qmax 60mL/sec, Pdet Qmax 19cm H2O, PVR 5mL on pressure flow - pt desires to avoid repeat midurethral sling   History of recurrent UTI (urinary tract infection) Assessment & Plan: - prior POCT with + uro  - For treatment of recurrent urinary tract infections, we discussed management of recurrent UTIs including prophylaxis with a daily low dose antibiotic, transvaginal estrogen therapy, D-mannose, and cranberry supplements.  We discussed the role of diagnostic testing such as cystoscopy and upper tract imaging.   - Rx Pyridium  and start ibuprofen as needed UTI symptoms, encouraged to avoid antibiotic exposure if unclear lower urinary tract symptoms - continue vaginal estrogen 0.5 to 1 g twice a week - NuSwab negative for infection - Discussed risk of resistant uropathogens with repeated antibiotic exposure - Rx macrobid  to start 3 days prior to procedure  Orders: -     Nitrofurantoin  Monohyd Macro; Take 1 capsule (100 mg total) by mouth at bedtime for 3 days. Start 3 days prior to your procedure  Dispense: 3 capsule; Refill: 0  Vaginal atrophy Assessment & Plan: - For symptomatic vaginal atrophy options include lubrication with a water -based lubricant, personal hygiene measures and barrier protection against wetness, and estrogen replacement in the form of vaginal cream, vaginal tablets, or a time-released vaginal ring.   - continue vaginal estrogen 1g twice a week   Nocturia Assessment & Plan: - avoid fluid intake after 6pm - timed voids during daytime - continue reduce caffeine intake - history of sleep disorder - continue Gemtesa    Constipation, unspecified constipation type Assessment & Plan: - improved with PTNS for 2-3 days after therapy, desires to resume if no relief with pelvic floor PT. Encouraged pt to call and schedule appointment if desires to proceed - baseline 1x/week, encouraged to continue titration of miralax to ensure bowel consistency. - pending pelvic  floor PT 11/2023 - discussed impact on urinary symptoms - For constipation, we reviewed the importance of a better bowel regimen.  We also discussed the importance of avoiding chronic straining, as it can exacerbate her pelvic floor symptoms; we discussed treating constipation and straining prior to surgery, as postoperative straining can lead to damage to the repair and recurrence of symptoms. We discussed initiating therapy with increasing fluid intake, fiber supplementation, stool softeners, and laxatives such as miralax.   Dyspareunia in female Assessment & Plan: - History of pelvic floor PT with relief in 2015 - Bilateral pelvic floor myofascial pain on prior exam - Encouraged to optimize stool consistency - continue low-dose vaginal estrogen for atrophy - pending pelvic floor PT scheduled 11/2023 to resume - No enlargement of Bartholin cyst on exam   Time spent: I spent 25 minutes dedicated to the care of this patient on the date of this encounter to include pre-visit review of records, face-to-face time with the patient discussing mixed urinary incontinence, history of midurethral sling, vaginal atrophy, nocturia, constipation and dyspareunia, and post visit documentation and ordering medication/ testing.   Darlene Ehlers, MD

## 2023-11-01 NOTE — Assessment & Plan Note (Addendum)
-   improved with PTNS for 2-3 days after therapy, desires to resume if no relief with pelvic floor PT. Encouraged pt to call and schedule appointment if desires to proceed - baseline 1x/week, encouraged to continue titration of miralax to ensure bowel consistency. - pending pelvic floor PT 11/2023 - discussed impact on urinary symptoms - For constipation, we reviewed the importance of a better bowel regimen.  We also discussed the importance of avoiding chronic straining, as it can exacerbate her pelvic floor symptoms; we discussed treating constipation and straining prior to surgery, as postoperative straining can lead to damage to the repair and recurrence of symptoms. We discussed initiating therapy with increasing fluid intake, fiber supplementation, stool softeners, and laxatives such as miralax.

## 2023-11-01 NOTE — Assessment & Plan Note (Signed)
-   For symptomatic vaginal atrophy options include lubrication with a water-based lubricant, personal hygiene measures and barrier protection against wetness, and estrogen replacement in the form of vaginal cream, vaginal tablets, or a time-released vaginal ring.   - continue vaginal estrogen 1g twice a week

## 2023-11-01 NOTE — Assessment & Plan Note (Signed)
-   History of pelvic floor PT with relief in 2015 - Bilateral pelvic floor myofascial pain on prior exam - Encouraged to optimize stool consistency - continue low-dose vaginal estrogen for atrophy - pending pelvic floor PT scheduled 11/2023 to resume - No enlargement of Bartholin cyst on exam

## 2023-11-01 NOTE — Assessment & Plan Note (Signed)
-   Midurethral sling placed in 08/11/17 with mesh excision 01/09/18 by Dr. Clarke Crouch - Prior postvoid residual 18 mL - urodynamics 10/25/23 with + CST, normal Qmax 71mL/sec, Pdet Qmax 19cm H2O, PVR 5mL on pressure flow - pt desires to avoid repeat midurethral sling

## 2023-11-01 NOTE — Assessment & Plan Note (Signed)
 - UDS prior to midurethral sling in 2018 with bladder capacity, cough induced detrusor overactivity incontinence, pelvic floor dyssynergia and history of dyspareunia with endometriosis s/p pelvic floor PT - UDS 10/25/23 showed + CST, + terminal DO, normal pressure flow and PVR with reduced MCC - continues Gemtesa due to relief, can discontinue after 2-3 weeks of botox  injection - avoid anticholinergics due to history of lower extremity edema from Detrol Constant, oxybutynin, Vesicare and chronic constipation - consider bladder pain syndrome due to IBS, migraine and recurrent lower urinary tract symptoms  - We discussed the symptoms of overactive bladder (OAB), which include urinary urgency, urinary frequency, nocturia, with or without urge incontinence.  While we do not know the exact etiology of OAB, several treatment options exist. We discussed management including behavioral therapy (decreasing bladder irritants, urge suppression strategies, timed voids, bladder retraining), physical therapy, medication; for refractory cases posterior tibial nerve stimulation, sacral neuromodulation, and intravesical botulinum toxin injection.  -Encouraged timed voids and bladder training to an increase daytime urinary frequency - pending pelvic floor PT due to pelvic floor myofascial pain on exam - desires to avoid MUS due to history of mid urethral sling and excision - no change in urinary symptoms after PTNS x 4, however improved bowel symptoms  - For refractory OAB we reviewed the procedure for intravesical Botox  injection with cystoscopy in the office and reviewed the risks, benefits and alternatives of treatment including but not limited to infection, need for self-catheterization and need for repeat therapy.  We discussed that there is a 5-15% chance of needing to catheterize with Botox  and that this usually resolves in a few months; however can persist for longer periods of time.  Typically Botox  injections would  need to be repeated every 3-12 months since this is not a permanent therapy. Desires to proceed  We discussed the role of sacral neuromodulation and how it works. It requires a test phase, and documentation of bladder function via diary. After a successful test period, a permanent wire and generator are placed in the OR. The battery lasts 5 years on average and would need to be replaced surgically.  The goal of this therapy is at least a 50% improvement in symptoms. It is NOT realistic to expect a 100% cure.  We reviewed the fact that about 30% of patients fail the test phase and are not candidates for permanent generator placement.  We discussed the risk of infection and that the patient would not be able to get an MRI once the device is placed. There are two companies that provide this therapy: [employer] and [employer]. [employer]' product is new and is similar to [employer]'s, but has advantages of a smaller and rechargeable battery and being able to have an MRI with the implant. For all procedures, we discussed risks of bleeding, infection, damage to surrounding organs including bowel, bladder, blood vessels, ureters and nerves, need for further surgery, risk of postoperative urinary incontinence or retention with need to catheterize, recurrent prolapse, numbness and weakness at any body site, buttock pain, and the rarer risks of blood clot, heart attack, pneumonia, death.    - positive cough stress test for stress urinary incontinence - For treatment of stress urinary incontinence,  non-surgical options include expectant management, weight loss, physical therapy, as well as a pessary.  Surgical options include a midurethral sling, Burch urethropexy, and transurethral injection of a bulking agent. She prefers an office procedure with urethral bulking (Bulkamid). We discussed success rate of approximately 70-80% and possible  need for second injection. We reviewed that this is not a permanent procedure and the  Bulkamid does dissolve over time. Risks reviewed including injury to bladder or urethra, UTI, urinary retention and hematuria.

## 2023-11-06 ENCOUNTER — Telehealth: Payer: Self-pay

## 2023-11-06 NOTE — Telephone Encounter (Signed)
Attempt was made to ger prior authorization for Urethral bulking and Botox . [employer] portal and phone services are closed due unforseen circumstances.

## 2023-11-08 DIAGNOSIS — H40033 Anatomical narrow angle, bilateral: Secondary | ICD-10-CM | POA: Diagnosis not present

## 2023-11-08 DIAGNOSIS — H1013 Acute atopic conjunctivitis, bilateral: Secondary | ICD-10-CM | POA: Diagnosis not present

## 2023-11-08 NOTE — Telephone Encounter (Signed)
PA request was submitted through [facility] Provider Portal for procedure : UB,Botox CPT code: 16109, [phone], [phone] PA request was: PA not needed see communication below.   [id number] :  1 of 1 SERVICE 1: This request doesn't require prior authorization. SERVICE 2: This request doesn't require prior authorization. SERVICE 3: This request doesn't require prior authorization. SERVICE 4: This request doesn't require prior authorization. SERVICE 5: This request doesn't require prior authorization. This PreCheck Request has been recorded in [employer] at the Member Contact Level. If there are special circumstances for which you still want [employer] to review the authorization request details then click the SUBMIT button again and your Authorization Request will be created and auth reference number generated. If no special circumstances exist then click HOME or AUTHORIZATIONS TAB and select YES to LOSING CHANGES.    11:21:07 AM

## 2023-11-09 ENCOUNTER — Ambulatory Visit (INDEPENDENT_AMBULATORY_CARE_PROVIDER_SITE_OTHER)
Admission: RE | Admit: 2023-11-09 | Discharge: 2023-11-09 | Disposition: A | Discharge status: Home / Self Care | Payer: Medicare Other | Source: Ambulatory Visit | Attending: Vascular Surgery | Admitting: Vascular Surgery

## 2023-11-09 ENCOUNTER — Ambulatory Visit (HOSPITAL_COMMUNITY)
Admission: RE | Admit: 2023-11-09 | Discharge: 2023-11-09 | Disposition: A | Discharge status: Home / Self Care | Payer: Medicare Other | Source: Ambulatory Visit | Attending: Vascular Surgery | Admitting: Vascular Surgery

## 2023-11-09 ENCOUNTER — Ambulatory Visit: Payer: Medicare Other | Admitting: Vascular Surgery

## 2023-11-09 VITALS — BP 113/83 | HR 94 | Temp 97.8°F

## 2023-11-09 DIAGNOSIS — I83811 Varicose veins of right lower extremities with pain: Secondary | ICD-10-CM | POA: Insufficient documentation

## 2023-11-09 DIAGNOSIS — I83812 Varicose veins of left lower extremities with pain: Secondary | ICD-10-CM | POA: Insufficient documentation

## 2023-11-09 NOTE — Progress Notes (Signed)
Subjective:     Patient ID: [name], female   DOB: Feb 16, 1971, 53 y.o.   MRN: [id number]  HPI 53 year old female status post right greater saphenous vein ablation and stab phlebectomy between 10 and 20 she has had pain particularly the popliteal fossa and radiating down the posterior leg and has been compliant with compression stockings.  She has not noted any increase swelling.     Review of Systems As above    Objective:   Physical Exam Vitals:   11/09/23 1031  BP: 113/83  Pulse: 94  Temp: 97.8 F (36.6 C)  SpO2: 97%   awake alert and oriented Nonlabored respirations Right lower extremity with expected postoperative induration of the lateral thigh and medial and lateral leg    Venous Reflux Times  +--------------+---------+------+-----------+------------+-----------------  ----+  RIGHT        Reflux NoRefluxReflux TimeDiameter cmsComments                                        Yes                                                 +--------------+---------+------+-----------+------------+-----------------  ----+  Popliteal                                          Acute thrombus                                                              extending via                                                               gastrocnemius  vein     +--------------+---------+------+-----------+------------+-----------------  ----+  GSV prox thigh                                      prior                                                                        ablation/stripping     +--------------+---------+------+-----------+------------+-----------------  ----+  GSV mid thigh                                       prior  ablation/stripping     +--------------+---------+------+-----------+------------+-----------------   ----+  GSV dist thigh                                      prior                                                                        ablation/stripping     +--------------+---------+------+-----------+------------+-----------------  ----+  GSV at knee                                         prior                                                                        ablation/stripping     +--------------+---------+------+-----------+------------+-----------------  ----+  GSV prox calf                                       prior                                                                        ablation/stripping     +--------------+---------+------+-----------+------------+-----------------  ----+      Summary:  Right:  - Acute deep vein thrombosis of a gastrocnemius vein and extends into the  popliteal vein.  - No evidence of acute deep vein thrombsosis in the remaining right lower  extremity vessels.  - The great saphenous vein demonstrates appears successfully ablated and  demonstrates no flow from the proximal thigh to the proximal calf.  - Ablation distance from saphenofemoral junction: 4.3 cm.   LEFT          Reflux NoRefluxReflux TimeDiameter cmsComments                                        Yes                                                 +--------------+---------+------+-----------+------------+-----------------  ----+  CFV                    yes   >1 second                                     +--------------+---------+------+-----------+------------+-----------------  ----+  GSV at [facility]              yes    >500 ms      0.80                            +--------------+---------+------+-----------+------------+-----------------  ----+  GSV prox thighno                            0.60                             +--------------+---------+------+-----------+------------+-----------------  ----+  GSV mid thigh           yes    >500 ms      0.30                            +--------------+---------+------+-----------+------------+-----------------  ----+  GSV dist thighno                            0.37                            +--------------+---------+------+-----------+------------+-----------------  ----+  GSV at knee   no                            0.30                            +--------------+---------+------+-----------+------------+-----------------  ----+  GSV prox calf           yes    >500 ms      0.20                            +--------------+---------+------+-----------+------------+-----------------  ----+  SSV Pop Fossa no                            0.20                            +--------------+---------+------+-----------+------------+-----------------  ----+  SSV prox calf no                            0.20                            +--------------+---------+------+-----------+------------+-----------------  ----+                         yes    >500 ms      0.40    AASV - short  segment,                                                     proximal thigh          +--------------+---------+------+-----------+------------+-----------------  ----+  Summary:  Left:  - No evidence of deep vein thrombosis seen in the left lower extremity,  from the common femoral through the popliteal veins.  - Venous reflux is noted in the left common femoral vein.  - Venous reflux is noted in the left sapheno-femoral junction.  - Venous reflux is noted in the left greater saphenous vein in the thigh,  knee and calf.        Assessment/plan     53 year old female status post right greater saphenous vein ablation here for follow-up post ablation duplex had evaluated her left lower extremity for reflux.  She does not have  any significant reflux for treatment on the left and would require sclerotherapy.  On the right side she interestingly has a DVT in the popliteal vein that appears to extend from a muscular venous thrombosis of the gastrocnemius likely secondary to stab phlebectomy as we did not have the laser near the popliteal vein.  As such we have discussed anticoagulation versus antiplatelet therapy and plan will be for full dose aspirin 325 mg daily and I will follow her up with right lower extremity venous duplex in 3 to 4 weeks to evaluate for resolution of the clot.  We discussed that if her symptoms worsen that she needs to call and we would place her on anticoagulation either Eliquis or Xarelto.  She has been evaluated by Cherene Julian, RN for sclerotherapy and this will wait until after clot has resolved she demonstrates good understanding of all the above.         Donold Marotto C. Randie Heinz, MD [facility] Office: [phone] Pager: [phone]

## 2023-11-14 DIAGNOSIS — L814 Other melanin hyperpigmentation: Secondary | ICD-10-CM | POA: Diagnosis not present

## 2023-11-14 DIAGNOSIS — L820 Inflamed seborrheic keratosis: Secondary | ICD-10-CM | POA: Diagnosis not present

## 2023-11-14 DIAGNOSIS — D2261 Melanocytic nevi of right upper limb, including shoulder: Secondary | ICD-10-CM | POA: Diagnosis not present

## 2023-11-14 DIAGNOSIS — L821 Other seborrheic keratosis: Secondary | ICD-10-CM | POA: Diagnosis not present

## 2023-11-14 DIAGNOSIS — L853 Xerosis cutis: Secondary | ICD-10-CM | POA: Diagnosis not present

## 2023-11-15 ENCOUNTER — Other Ambulatory Visit: Payer: Self-pay

## 2023-11-15 DIAGNOSIS — I83811 Varicose veins of right lower extremities with pain: Secondary | ICD-10-CM

## 2023-11-15 DIAGNOSIS — M6281 Muscle weakness (generalized): Secondary | ICD-10-CM | POA: Diagnosis not present

## 2023-11-15 DIAGNOSIS — M25512 Pain in left shoulder: Secondary | ICD-10-CM | POA: Diagnosis not present

## 2023-11-15 DIAGNOSIS — M79602 Pain in left arm: Secondary | ICD-10-CM | POA: Diagnosis not present

## 2023-11-15 DIAGNOSIS — I872 Venous insufficiency (chronic) (peripheral): Secondary | ICD-10-CM

## 2023-11-17 ENCOUNTER — Telehealth: Payer: Self-pay | Admitting: Obstetrics

## 2023-11-17 DIAGNOSIS — H16143 Punctate keratitis, bilateral: Secondary | ICD-10-CM | POA: Diagnosis not present

## 2023-11-17 DIAGNOSIS — F1020 Alcohol dependence, uncomplicated: Secondary | ICD-10-CM | POA: Diagnosis not present

## 2023-11-17 DIAGNOSIS — F332 Major depressive disorder, recurrent severe without psychotic features: Secondary | ICD-10-CM | POA: Diagnosis not present

## 2023-11-17 NOTE — Telephone Encounter (Signed)
Pt called in today requested to be scheduled for a PTNS for next week.  She said it does help her for her BMs.  She is scheduled for UB and Botox end of next month.  Is she able to get PTNS prn?

## 2023-11-20 ENCOUNTER — Ambulatory Visit (INDEPENDENT_AMBULATORY_CARE_PROVIDER_SITE_OTHER): Payer: Medicare Other | Admitting: Obstetrics and Gynecology

## 2023-11-20 ENCOUNTER — Encounter: Payer: Self-pay | Admitting: Obstetrics and Gynecology

## 2023-11-20 VITALS — BP 97/71 | HR 84

## 2023-11-20 DIAGNOSIS — M79602 Pain in left arm: Secondary | ICD-10-CM | POA: Diagnosis not present

## 2023-11-20 DIAGNOSIS — N3946 Mixed incontinence: Secondary | ICD-10-CM

## 2023-11-20 DIAGNOSIS — N393 Stress incontinence (female) (male): Secondary | ICD-10-CM | POA: Diagnosis not present

## 2023-11-20 DIAGNOSIS — M6281 Muscle weakness (generalized): Secondary | ICD-10-CM | POA: Diagnosis not present

## 2023-11-20 DIAGNOSIS — N3281 Overactive bladder: Secondary | ICD-10-CM | POA: Diagnosis not present

## 2023-11-20 DIAGNOSIS — R350 Frequency of micturition: Secondary | ICD-10-CM

## 2023-11-20 DIAGNOSIS — M25512 Pain in left shoulder: Secondary | ICD-10-CM | POA: Diagnosis not present

## 2023-11-20 NOTE — Progress Notes (Signed)
[facility]  PTNS VISIT  CC:  Overactive bladder  53 y.o. with refractory overactive bladder who presents for percutaneous tibial nerve stimulation. The patient presents for PTNS session # 5.   Patient underwent urodynamics which showed: 1. Sensation was increased; capacity was reduced 2. Stress Incontinence was demonstrated at normal pressures; 3. 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  4. Emptying was dysfunctional with a normal PVR, a sustained detrusor contraction present,  abdominal straining not present, dyssynergic urethral sphincter activity on EMG.     Procedure: The patient was placed in the sitting position and the left lower extremity was prepped in the usual fashion. The PTNS needle was then inserted at a 60 degree angle, 5 cm cephalad and 2 cm posterior to the medial malleolus. The PTNS unit was then programmed and an optimal response was noted at 5 milliamps. The PTNS stimulation was then performed at this setting for 30 minutes without incident and the patient tolerated the procedure well. The needle was removed and hemostasis was noted.   Patient reports that she gets good results with PTNS but it is short acting. She would be a good candidate for Vivally as she could do this treatment at home at her leisure and more frequently than once a week.   Patient has significant OAB and SUI. She is currently planning urethral bulking for SUI. She has failed medication (Vibegron) and is unable to take anticholinergics due to hx of lower extremity edema.   Order placed for Vivally to see if her insurance will support this therapy. I believe she would get good support if there is insurance coverage for the therapy.   All questions were answered.

## 2023-11-21 ENCOUNTER — Telehealth: Payer: Self-pay

## 2023-11-21 NOTE — Telephone Encounter (Signed)
Pt said the information she was given for [place]. He no longer works for [employer]. Pt said the company does not have a ref for this area and is requesting a call.

## 2023-11-23 NOTE — Therapy (Signed)
 OUTPATIENT PHYSICAL THERAPY FEMALE PELVIC EVALUATION   Patient Name: Ellison Leisure Still Cratty MRN: [id number] DOB:[birth year], 53 y.o., female Today's Date: 11/24/2023  END OF SESSION:  PT End of Session - 11/24/23 0934     Visit Number 1    Date for PT Re-Evaluation 02/16/24    Authorization Type [employer] medicare    Progress Note Due on Visit 10    PT Start Time 0930    PT Stop Time 1010    PT Time Calculation (min) 40 min    Activity Tolerance Patient tolerated treatment well    Behavior During Therapy WFL for tasks assessed/performed             Past Medical History:  Diagnosis Date   ADD (attention deficit disorder)    Anxiety    Arthropathy, unspecified, site unspecified    Chronic fatigue syndrome    Depression    Disc degeneration    Fibromyalgia    History of kidney stones    last stone in 2010   Migraines    Multilevel degenerative disc disease    cervical and lumbar, has had injections/epidural   Panic attack 2019   PTSD (post-traumatic stress disorder) 2019   Sleep disorder 01/08/2014   Sleep disturbance, unspecified    Symptomatic mammary hypertrophy 09/2018   Unspecified nonpsychotic mental disorder    Past Surgical History:  Procedure Laterality Date   BILATERAL SALPINGECTOMY Bilateral 10/16/2013   Procedure: BILATERAL SALPINGECTOMY;  Surgeon: Charlie JINNY Flowers, MD;  Location: [facility];  Service: Gynecology;  Laterality: Bilateral;   BREAST REDUCTION SURGERY Bilateral 10/03/2018   Procedure: BILATERAL BREAST REDUCTION;  Surgeon: Lowery Estefana RAMAN, DO;  Location: [facility];  Service: Plastics;  Laterality: Bilateral;   CESAREAN SECTION     x2   CHOLECYSTECTOMY     DIAGNOSTIC LAPAROSCOPY     ENDOVENOUS ABLATION SAPHENOUS VEIN W/ LASER Right 10/26/2023   endovenous laser ablation right greater saphenous vein and stab phlebectomy10-20 incisions right leg by Penne Colorado MD   MANDIBLE SURGERY     REMOVAL OF URINARY SLING N/A 01/09/2018    Procedure: REMOVAL OF MESH URINARY SLING EXTRUSION  CYSTOSTOPY;  Surgeon: Gaston Hamilton, MD;  Location: [facility];  Service: Urology;  Laterality: N/A;   ROBOTIC ASSISTED TOTAL HYSTERECTOMY N/A 10/16/2013   Procedure: ROBOTIC ASSISTED TOTAL HYSTERECTOMY;  Surgeon: Charlie JINNY Flowers, MD;  Location: [facility];  Service: Gynecology;  Laterality: N/A;   ROTATOR CUFF REPAIR Left 08/21/2023   Patient Active Problem List   Diagnosis Date Noted   Dyspareunia in female 08/18/2023   Constipation 08/18/2023   Nocturia 08/18/2023   Urinary incontinence, mixed 08/18/2023   History of pelvic surgery 08/18/2023   History of recurrent UTI (urinary tract infection) 08/18/2023   Vaginal atrophy 08/18/2023   Vaginal irritation 08/18/2023   Major depressive disorder, recurrent episode (HCC) 06/19/2022   History of COVID-19 01/21/2020   Chronic cough 01/21/2020   Migraine without status migrainosus, not intractable 01/21/2020   Tinnitus of both ears 01/21/2020   Neck pain 08/18/2018   Back pain 08/18/2018   Symptomatic mammary hypertrophy 08/18/2018   Obesity, Class II, BMI 35-39.9 09/29/2017   Depression 03/31/2017   Thyroid  disease 03/31/2017   Sleep disorder 01/08/2014   Fibroids 10/16/2013    PCP: Onita Rush, MD  REFERRING PROVIDER: Guadlupe Lianne DASEN, MD   REFERRING DIAG:  N94.10 (ICD-10-CM) - Dyspareunia in female  K16.00 (ICD-10-CM) - Constipation, unspecified constipation type  R35.1 (ICD-10-CM) - Nocturia  N39.46 (ICD-10-CM) - Urinary incontinence, mixed  Z98.890 (ICD-10-CM) - History of pelvic surgery    THERAPY DIAG:  Muscle weakness (generalized)  Cramp and spasm  Pelvic pain  Scar  Rationale for Evaluation and Treatment: Rehabilitation  ONSET DATE: 2018  SUBJECTIVE:                                                                                                                                                                                           SUBJECTIVE  STATEMENT: Patient has been getting PTNS on #5 and is helping with my bowel movements. I now can have a bowel movement daily. I will have the bulking and botox  on the pelvic floor. I need to learn how to relax her muscles. Patient is having bladder spasms and leak urine. I have painful intercourse. She feels a twisted knot in the lower right quadrant when she gets the urge to have a bowel movement. 2 hysterectomy's with scar on lower abdomen. She has had 2 c-sections. Surgery for endometriosis. I do not consume a lot of food. Learned how to poop last physical therapy.  Fluid intake: Yes: water  but not enough, low fluid intake    PAIN:  Are you having pain? Yes NPRS scale: 5/10 Pain location: Vaginal, feels like the left side gets irritated  Pain type: sharp Pain description: intermittent   Aggravating factors: penile penetration vaginally Relieving factors: no penile penetration  PRECAUTIONS: None  RED FLAGS: None   WEIGHT BEARING RESTRICTIONS: No  FALLS:  Has patient fallen in last 6 months? 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  Patient has stool leakage.    URINATION: Pain with urination: Yes, tingling pin stab  Fully empty bladder: Yes:   Stream: Strong Urgency: Yes:   unable to stop the flow of urine when leaking Frequency: night gets up 1 per night; day  urinate 1-2 times Leakage: Urge to void, Walking to the bathroom, Coughing, Sneezing, Laughing, Lifting, and Bending forward Pads: Yes: 3-5 per day  INTERCOURSE: Pain with intercourse:  Initial Penetration and During Penetration, pain on left side Ability to have vaginal penetration:  Yes:   Climax: not often Marinoff Scale: 2/3  PREGNANCY: C-section deliveries 2  OBJECTIVE:  Note: Objective measures were completed at Evaluation  unless otherwise noted.  DIAGNOSTIC FINDINGS:  none   COGNITION: Overall cognitive status: Within functional limits for tasks assessed     SENSATION: Light touch: Appears intact Proprioception: Appears intact    POSTURE: rounded shoulders, forward head, decreased lumbar lordosis, and feet turned outward  PELVIC ALIGNMENT:ASIS are equal   LOWER EXTREMITY MNF:apojuzmjo hip ROM is full   LOWER EXTREMITY MMT: bilateral hip strength is 4-/5   PALPATION:   General  decreased movement of the lower abdominal scar; fascial tightness in the abdomen.                 External Perineal Exam tenderness located in the clitoris, along the ischiocavernosus, and perineal body                             Internal Pelvic Floor tenderness located in the levator ani, obturator internist, sides of the bladder, urethra, along the anterior wall of the vaginal canal  Patient confirms identification and approves PT to assess internal pelvic floor and treatment Yes  PELVIC MMT:   MMT eval  Vaginal 2/5  Internal Anal Sphincter 3/5  External Anal Sphincter 3/5  Puborectalis 3/5  (Blank rows = not tested)        TONE: Increased tone on the left pelvic floor  PROLAPSE: none  TODAY'S TREATMENT:                                                                                                                              DATE: 11/24/23  EVAL see below   PATIENT EDUCATION:  11/24/23 Education details: educated patient on vaginal wand to purchase to work on the pelvic floor trigger points and where to purchase it.  Person educated: Patient Education method: Explanation, Demonstration, Tactile cues, Verbal cues, and Handouts Education  comprehension: verbalized understanding, returned demonstration, verbal cues required, tactile cues required, and needs further education  HOME EXERCISE PROGRAM: See above.   ASSESSMENT:  CLINICAL IMPRESSION: Patient is a 53 y.o. female who was seen today for physical therapy evaluation and treatment for dyspareunia, constipation, urinary incontinence, and history of pelvic surgery. Patient reports She has pain with penile penetration at level 5/10 with most pain on the left side and during insertion.  She feels a twisted knot in the lower right quadrant when she gets the urge to have a bowel movement. She will leak stool at times. She will leak urine with urge to void, walking to the bathroom, coughing, sneezing, laughing, lifting, and bending forward. She wears  3-5 pads per day. She is unable to stop the flow of urine when leaking. Pelvic floor strength is 2/5 with less on the right. Rectal strength is 3/5. Tenderness located in the levator ani, obturator internist, sides of the bladder, urethra, along the anterior wall of the vaginal canal, clitoris, along the ischiocavernosus, and perineal body. She has restrictions along the lower abdominal scar.  Patient will benefit from skilled therapy to improve pelvic floor coordination and strength while reducing her pain.    OBJECTIVE IMPAIRMENTS: decreased activity tolerance, decreased coordination, decreased strength, increased fascial restrictions, increased muscle spasms, and pain.   ACTIVITY LIMITATIONS: lifting, bending, sitting, standing, squatting, continence, toileting, and locomotion level  PARTICIPATION LIMITATIONS: meal prep, cleaning, laundry, interpersonal relationship, driving, shopping, and community activity  PERSONAL FACTORS: Fitness and 3+ comorbidities: ADD; Chronic Fatique Syndrome; Fibromyalgia; PTSD; Cholecystectomy; Cesarean sectoin; Total hysterectomy 2014; bladder sling 2017 with mesh; Mesh removed 2018  are also affecting  patient's functional outcome.   REHAB POTENTIAL: Good  CLINICAL DECISION MAKING: Evolving/moderate complexity  EVALUATION COMPLEXITY: Moderate   GOALS: Goals reviewed with patient? Yes  SHORT TERM GOALS: Target date: 12/22/23  Patient educated on using a vaginal wand on the pelvic floor muscles to reduce the trigger points.  Baseline: Goal status: INITIAL  2.  Patient able to perform diaphragmatic breathing to elongate the pelvic floor muscles.  Baseline:  Goal status: INITIAL  3.  Patient educated on initial HEP for flexibility exercises.  Baseline:  Goal status: INITIAL  4.  Patient educated on scar massage to reduce restrictions of the lower abdomen.  Baseline:  Goal status: INITIAL  LONG TERM GOALS: Target date: 02/16/24  Patient is independent with advanced HEP for core and pelvic floor.  Baseline:  Goal status: INITIAL  2.  Patient is able to have penile penetration vaginally due to the reduction of trigger points of the pelvic floor.  Baseline:  Goal status: INITIAL  3.  Patient reports her urinary leakage decreased >/= 75% due to increased strength of the pelvic floor >/= 3/5 with circular hug of therapist finger.  Baseline:  Goal status: INITIAL  4.  Patient reports her stool leakage has decreased >/= 75% due to reduction of trigger points in the pelvic floor.  Baseline:  Goal status: INITIAL  5.  Patient has improved mobility of the lower abdominal scar so the twisted knot feeling is reduced >/= 50%.  Baseline:  Goal status: INITIAL    PLAN:  PT FREQUENCY: 1-2x/week  PT DURATION: 6 months  PLANNED INTERVENTIONS: 97110-Therapeutic exercises, 97530- Therapeutic activity, 97112- Neuromuscular re-education, 97535- Self Care, [id number]- Manual therapy, [phone]- Aquatic Therapy, 97014- Electrical stimulation (unattended), 97035- Ultrasound, Patient/Family education, Dry Needling, Joint mobilization, Spinal mobilization, Scar mobilization, Cryotherapy, Moist heat,  and Biofeedback  PLAN FOR NEXT SESSION: manual work to the lower abdominal scar, diaphragmatic breathing, hip stretches, sit on ball to massage the pelvic floor, see if she has gotten the wand then educate   Channing Pereyra, PT 11/24/23 11:33 AM

## 2023-11-24 ENCOUNTER — Ambulatory Visit: Payer: Medicare Other | Attending: Obstetrics | Admitting: Physical Therapy

## 2023-11-24 ENCOUNTER — Other Ambulatory Visit: Payer: Self-pay

## 2023-11-24 ENCOUNTER — Encounter: Payer: Self-pay | Admitting: Physical Therapy

## 2023-11-24 DIAGNOSIS — R102 Pelvic and perineal pain: Secondary | ICD-10-CM | POA: Insufficient documentation

## 2023-11-24 DIAGNOSIS — M6281 Muscle weakness (generalized): Secondary | ICD-10-CM | POA: Insufficient documentation

## 2023-11-24 DIAGNOSIS — R351 Nocturia: Secondary | ICD-10-CM | POA: Diagnosis not present

## 2023-11-24 DIAGNOSIS — K5900 Constipation, unspecified: Secondary | ICD-10-CM | POA: Insufficient documentation

## 2023-11-24 DIAGNOSIS — N941 Unspecified dyspareunia: Secondary | ICD-10-CM | POA: Diagnosis not present

## 2023-11-24 DIAGNOSIS — R252 Cramp and spasm: Secondary | ICD-10-CM

## 2023-11-24 DIAGNOSIS — Z9889 Other specified postprocedural states: Secondary | ICD-10-CM | POA: Diagnosis not present

## 2023-11-24 DIAGNOSIS — L905 Scar conditions and fibrosis of skin: Secondary | ICD-10-CM | POA: Insufficient documentation

## 2023-11-24 DIAGNOSIS — N3946 Mixed incontinence: Secondary | ICD-10-CM | POA: Insufficient documentation

## 2023-11-24 NOTE — Telephone Encounter (Signed)
 Called and spoke to patient. Am working on filling out my part to see if insurance covers the device.

## 2023-11-27 DIAGNOSIS — M6281 Muscle weakness (generalized): Secondary | ICD-10-CM | POA: Diagnosis not present

## 2023-11-27 DIAGNOSIS — M79602 Pain in left arm: Secondary | ICD-10-CM | POA: Diagnosis not present

## 2023-11-27 DIAGNOSIS — M25512 Pain in left shoulder: Secondary | ICD-10-CM | POA: Diagnosis not present

## 2023-11-29 ENCOUNTER — Ambulatory Visit (INDEPENDENT_AMBULATORY_CARE_PROVIDER_SITE_OTHER): Payer: Medicare Other | Admitting: Vascular Surgery

## 2023-11-29 ENCOUNTER — Encounter: Payer: Self-pay | Admitting: Vascular Surgery

## 2023-11-29 ENCOUNTER — Ambulatory Visit (HOSPITAL_COMMUNITY)
Admission: RE | Admit: 2023-11-29 | Discharge: 2023-11-29 | Disposition: A | Discharge status: Home / Self Care | Payer: Medicare Other | Source: Ambulatory Visit | Attending: Vascular Surgery | Admitting: Vascular Surgery

## 2023-11-29 VITALS — BP 127/89 | HR 100 | Temp 98.4°F | Resp 20 | Ht 62.0 in | Wt 184.0 lb

## 2023-11-29 DIAGNOSIS — I872 Venous insufficiency (chronic) (peripheral): Secondary | ICD-10-CM | POA: Diagnosis not present

## 2023-11-29 DIAGNOSIS — I82431 Acute embolism and thrombosis of right popliteal vein: Secondary | ICD-10-CM

## 2023-11-29 NOTE — Progress Notes (Signed)
Subjective:     Patient ID: [name], female   DOB: 31-Jul-1971, 53 y.o.   MRN: [id number]  HPI 53 year old female status post right greater saphenous vein ablation and stab phlebectomy.  At last visit she was noted to have thrombus from her gastrocnemius veins extending into the popliteal veins she was initiated on aspirin therapy and now follows up for repeat duplex.  She has persistent bilateral posterior leg pain and occasional right lower extremity pain if she lays on her side.  She also has discomfort and swelling of her bilateral lower extremities below the knees and this did not resolve with recent treatment of venous insufficiency.  We have discussed the need for sclerotherapy but she states that if this is cosmetic she is really not interested at this time.   Review of Systems Bilateral leg pain as above    Objective:   Physical Exam Vitals:   11/29/23 1525  BP: 127/89  Pulse: 100  Resp: 20  Temp: 98.4 F (36.9 C)  SpO2: 98%    Awake alert and oriented Nonlabored respirations Bilateral lower extremities have telangiectasias there is trace edema bilateral ankles   RIGHT    CompressibilityPhasicitySpontaneityPropertiesThrombus  Aging  +---------+---------------+---------+-----------+----------+--------------+   CFV     Full           Yes      Yes                                   +---------+---------------+---------+-----------+----------+--------------+   SFJ     Full                    Yes                                   +---------+---------------+---------+-----------+----------+--------------+   FV Prox  Full           Yes      Yes                                   +---------+---------------+---------+-----------+----------+--------------+   FV Mid   Full           Yes      Yes                                   +---------+---------------+---------+-----------+----------+--------------+   FV  DistalFull           Yes      Yes                                   +---------+---------------+---------+-----------+----------+--------------+   POP     Full           Yes      Yes                                   +---------+---------------+---------+-----------+----------+--------------+   PTV     Full                    Yes                                   +---------+---------------+---------+-----------+----------+--------------+  PERO    Full                    Yes                                   +---------+---------------+---------+-----------+----------+--------------+   Gastroc Full                                                          +---------+---------------+---------+-----------+----------+--------------+   GSV     None                                                          +---------+---------------+---------+-----------+----------+--------------+      Summary:  RIGHT:  - Previous popliteal and gastrocnemious vein DVT has resolved.  - GSV mid thigh remains closed.           Assessment/plan     53 year old female status post above-noted procedure.  Thankfully DVT has resolved with aspirin and this can be discontinued.  We have discussed sclerotherapy at this time she is not interested.  I recommended continued compression stockings as tolerated and she can see me on an as-needed basis.    Sandy Blouch C. Randie Heinz, MD [facility] Office: [phone] Pager: [phone]

## 2023-12-01 ENCOUNTER — Encounter: Payer: Self-pay | Admitting: Physical Therapy

## 2023-12-01 ENCOUNTER — Ambulatory Visit: Payer: Medicare Other | Admitting: Physical Therapy

## 2023-12-01 DIAGNOSIS — R102 Pelvic and perineal pain: Secondary | ICD-10-CM | POA: Diagnosis not present

## 2023-12-01 DIAGNOSIS — R252 Cramp and spasm: Secondary | ICD-10-CM

## 2023-12-01 DIAGNOSIS — R351 Nocturia: Secondary | ICD-10-CM | POA: Diagnosis not present

## 2023-12-01 DIAGNOSIS — Z9889 Other specified postprocedural states: Secondary | ICD-10-CM | POA: Diagnosis not present

## 2023-12-01 DIAGNOSIS — N941 Unspecified dyspareunia: Secondary | ICD-10-CM | POA: Diagnosis not present

## 2023-12-01 DIAGNOSIS — M6281 Muscle weakness (generalized): Secondary | ICD-10-CM

## 2023-12-01 DIAGNOSIS — L905 Scar conditions and fibrosis of skin: Secondary | ICD-10-CM

## 2023-12-01 DIAGNOSIS — K5900 Constipation, unspecified: Secondary | ICD-10-CM | POA: Diagnosis not present

## 2023-12-01 DIAGNOSIS — N3946 Mixed incontinence: Secondary | ICD-10-CM | POA: Diagnosis not present

## 2023-12-01 NOTE — Therapy (Signed)
OUTPATIENT PHYSICAL THERAPY FEMALE PELVIC TREATMENT   Patient Name: [name] MRN: [id number] DOB:[birth year], 53 y.o., female Today's Date: 12/01/2023  END OF SESSION:  PT End of Session - 12/01/23 0938     Visit Number 2    Date for PT Re-Evaluation 02/16/24    Authorization Type [employer] medicare    Authorization - Number of Visits 2    Progress Note Due on Visit 10    PT Start Time 0930    PT Stop Time 1010    PT Time Calculation (min) 40 min    Activity Tolerance Patient tolerated treatment well    Behavior During Therapy WFL for tasks assessed/performed             Past Medical History:  Diagnosis Date   ADD (attention deficit disorder)    Anxiety    Arthropathy, unspecified, site unspecified    Chronic fatigue syndrome    Depression    Disc degeneration    Fibromyalgia    History of kidney stones    last stone in 2010   Migraines    Multilevel degenerative disc disease    cervical and lumbar, has had injections/epidural   Panic attack 2019   PTSD (post-traumatic stress disorder) 2019   Sleep disorder 01/08/2014   Sleep disturbance, unspecified    Symptomatic mammary hypertrophy 09/2018   Unspecified nonpsychotic mental disorder    Past Surgical History:  Procedure Laterality Date   BILATERAL SALPINGECTOMY Bilateral 10/16/2013   Procedure: BILATERAL SALPINGECTOMY;  Surgeon: Lenoard Aden, MD;  Location: [facility];  Service: Gynecology;  Laterality: Bilateral;   BREAST REDUCTION SURGERY Bilateral 10/03/2018   Procedure: BILATERAL BREAST REDUCTION;  Surgeon: Peggye Form, DO;  Location: [facility];  Service: Plastics;  Laterality: Bilateral;   CESAREAN SECTION     x2   CHOLECYSTECTOMY     DIAGNOSTIC LAPAROSCOPY     ENDOVENOUS ABLATION SAPHENOUS VEIN W/ LASER Right 10/26/2023   endovenous laser ablation right greater saphenous vein and stab phlebectomy10-20 incisions right leg by Lemar Livings MD   MANDIBLE SURGERY      REMOVAL OF URINARY SLING N/A 01/09/2018   Procedure: REMOVAL OF MESH URINARY SLING EXTRUSION  CYSTOSTOPY;  Surgeon: Alfredo Martinez, MD;  Location: [facility];  Service: Urology;  Laterality: N/A;   ROBOTIC ASSISTED TOTAL HYSTERECTOMY N/A 10/16/2013   Procedure: ROBOTIC ASSISTED TOTAL HYSTERECTOMY;  Surgeon: Lenoard Aden, MD;  Location: [facility];  Service: Gynecology;  Laterality: N/A;   ROTATOR CUFF REPAIR Left 08/21/2023   Patient Active Problem List   Diagnosis Date Noted   Dyspareunia in female 08/18/2023   Constipation 08/18/2023   Nocturia 08/18/2023   Urinary incontinence, mixed 08/18/2023   History of pelvic surgery 08/18/2023   History of recurrent UTI (urinary tract infection) 08/18/2023   Vaginal atrophy 08/18/2023   Vaginal irritation 08/18/2023   Major depressive disorder, recurrent episode (HCC) 06/19/2022   History of COVID-19 01/21/2020   Chronic cough 01/21/2020   Migraine without status migrainosus, not intractable 01/21/2020   Tinnitus of both ears 01/21/2020   Neck pain 08/18/2018   Back pain 08/18/2018   Symptomatic mammary hypertrophy 08/18/2018   Obesity, Class II, BMI 35-39.9 09/29/2017   Depression 03/31/2017   Thyroid disease 03/31/2017   Sleep disorder 01/08/2014   Fibroids 10/16/2013    PCP: Creola Corn, MD  REFERRING PROVIDER: Loleta Chance, MD   REFERRING DIAG:  N94.10 (ICD-10-CM) - Dyspareunia in female  K34.00 (ICD-10-CM) -  Constipation, unspecified constipation type  R35.1 (ICD-10-CM) - Nocturia  N39.46 (ICD-10-CM) - Urinary incontinence, mixed  Z98.890 (ICD-10-CM) - History of pelvic surgery    THERAPY DIAG:  Muscle weakness (generalized)  Cramp and spasm  Pelvic pain  Scar  Rationale for Evaluation and Treatment: Rehabilitation  ONSET DATE: 2018  SUBJECTIVE:                                                                                                                                                                                            SUBJECTIVE STATEMENT: I have bought the products for the pelvic floor.   Fluid intake: Yes: water but not enough, low fluid intake    PAIN:  Are you having pain? Yes NPRS scale: 5/10 Pain location: Vaginal, feels like the left side gets irritated  Pain type: sharp Pain description: intermittent   Aggravating factors: penile penetration vaginally Relieving factors: no penile penetration  PRECAUTIONS: None  RED FLAGS: None   WEIGHT BEARING RESTRICTIONS: No  FALLS:  Has patient fallen in last 6 months? 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  Patient has stool leakage.    URINATION: Pain with urination: Yes, tingling pin stab  Fully empty bladder: Yes:   Stream: Strong Urgency: Yes:   unable to stop the flow of urine when leaking Frequency: night gets up 1 per night; day  urinate 1-2 times Leakage: Urge to void, Walking to the bathroom, Coughing, Sneezing, Laughing, Lifting, and Bending forward Pads: Yes: 3-5 per day  INTERCOURSE: Pain with intercourse: Initial Penetration and During Penetration, pain on left side Ability to have vaginal penetration:  Yes:   Climax: not often Marinoff Scale: 2/3  PREGNANCY: C-section deliveries 2  OBJECTIVE:  Note: Objective measures were completed at Evaluation unless otherwise noted.  DIAGNOSTIC FINDINGS:  none   COGNITION: Overall cognitive status: Within functional limits for tasks assessed     SENSATION: Light touch: Appears intact Proprioception: Appears  intact    POSTURE: rounded shoulders, forward head, decreased lumbar lordosis, and feet turned outward  PELVIC ALIGNMENT:ASIS are equal   LOWER EXTREMITY ZOX:[id number] hip ROM is full   LOWER EXTREMITY MMT: bilateral hip strength is 4-/5   PALPATION:   General  decreased movement of the lower abdominal  scar; fascial tightness in the abdomen.                 External Perineal Exam tenderness located in the clitoris, along the ischiocavernosus, and perineal body                             Internal Pelvic Floor tenderness located in the levator ani, obturator internist, sides of the bladder, urethra, along the anterior wall of the vaginal canal  Patient confirms identification and approves PT to assess internal pelvic floor and treatment Yes  PELVIC MMT:   MMT eval  Vaginal 2/5  Internal Anal Sphincter 3/5  External Anal Sphincter 3/5  Puborectalis 3/5  (Blank rows = not tested)        TONE: Increased tone on the left pelvic floor  PROLAPSE: none  TODAY'S TREATMENT:     12/01/23 Manual: Scar tissue mobilization: Manual work to the lower abdominal scar working through the restrictions Pulling up on the scar to stretch the scar tissue and lengthen Educated patient on scar massage to do at home.  Myofascial release: Fascial release of the lower abdomen going through the restrictions, release around the umbilicus, release along the lateral sides of the abdominus  Self-care: Educated patient on using a vaginal moisturizer of the vulva area and how it improves the tissue health                                                                                                                             DATE: 11/24/23  EVAL see below   PATIENT EDUCATION:  11/24/23 Education details: educated patient on vaginal wand to purchase to work on the pelvic floor trigger points and where to purchase it.  Person educated: Patient Education method: Explanation, Demonstration, Tactile  cues, Verbal cues, and Handouts Education comprehension: verbalized understanding, returned demonstration, verbal cues required, tactile cues required, and needs further education  HOME EXERCISE PROGRAM: See above.   ASSESSMENT:  CLINICAL IMPRESSION: Patient is a 53 y.o. female who was seen today for physical therapy  treatment for dyspareunia, constipation, urinary incontinence, and history of pelvic surgery. Patient had less restrictions in the scar after the manual work. Patient had less swelling in the abdomen. She has gotten the wand to work on the pelvic floor.  Patient will benefit from skilled therapy to improve pelvic floor coordination and strength while reducing her pain.    OBJECTIVE IMPAIRMENTS: decreased activity tolerance, decreased coordination, decreased strength, increased fascial restrictions, increased muscle spasms, and pain.   ACTIVITY LIMITATIONS: lifting, bending, sitting, standing, squatting, continence, toileting, and locomotion level  PARTICIPATION LIMITATIONS: meal prep, cleaning, laundry, interpersonal relationship, driving, shopping, and community activity  PERSONAL FACTORS: Fitness and 3+ comorbidities: ADD; Chronic Fatique Syndrome; Fibromyalgia; PTSD; Cholecystectomy; Cesarean sectoin; Total hysterectomy 2014; bladder sling 2017 with mesh; Mesh removed 2018  are also affecting patient's functional outcome.   REHAB POTENTIAL: Good  CLINICAL DECISION MAKING: Evolving/moderate complexity  EVALUATION COMPLEXITY: Moderate   GOALS: Goals reviewed with patient? Yes  SHORT TERM GOALS: Target date: 12/22/23  Patient educated on using a vaginal wand on the pelvic floor muscles to reduce the trigger points.  Baseline: Goal status: INITIAL  2.  Patient able to perform diaphragmatic breathing to elongate the pelvic floor muscles.  Baseline:  Goal status: INITIAL  3.  Patient educated on initial HEP for flexibility exercises.  Baseline:  Goal status:  INITIAL  4.  Patient educated on scar massage to reduce restrictions of the lower abdomen.  Baseline:  Goal status: INITIAL  LONG TERM GOALS: Target date: 02/16/24  Patient is independent with advanced HEP for core and pelvic floor.  Baseline:  Goal status: INITIAL  2.  Patient is able to have penile penetration vaginally due to the reduction of trigger points of the pelvic floor.  Baseline:  Goal status: INITIAL  3.  Patient reports her urinary leakage decreased >/= 75% due to increased strength of the pelvic floor >/= 3/5 with circular hug of therapist finger.  Baseline:  Goal status: INITIAL  4.  Patient reports her stool leakage has decreased >/= 75% due to reduction of trigger points in the pelvic floor.  Baseline:  Goal status: INITIAL  5.  Patient has improved mobility of the lower abdominal scar so the twisted knot feeling is reduced >/= 50%.  Baseline:  Goal status: INITIAL    PLAN:  PT FREQUENCY: 1-2x/week  PT DURATION: 6 months  PLANNED INTERVENTIONS: 97110-Therapeutic exercises, 97530- Therapeutic activity, 97112- Neuromuscular re-education, 97535- Self Care, 60454- Manual therapy, [phone]- Aquatic Therapy, 97014- Electrical stimulation (unattended), 97035- Ultrasound, Patient/Family education, Dry Needling, Joint mobilization, Spinal mobilization, Scar mobilization, Cryotherapy, Moist heat, and Biofeedback  PLAN FOR NEXT SESSION: manual work to the lower abdominal scar, diaphragmatic breathing, hip stretches, sit on ball to massage the pelvic floor, see if she has gotten the wand then educate   Eulis Foster, PT 12/01/23 10:13 AM

## 2023-12-04 ENCOUNTER — Encounter: Payer: Self-pay | Admitting: Physical Therapy

## 2023-12-04 ENCOUNTER — Ambulatory Visit: Payer: Medicare Other | Admitting: Physical Therapy

## 2023-12-04 DIAGNOSIS — M6281 Muscle weakness (generalized): Secondary | ICD-10-CM | POA: Diagnosis not present

## 2023-12-04 DIAGNOSIS — R351 Nocturia: Secondary | ICD-10-CM | POA: Diagnosis not present

## 2023-12-04 DIAGNOSIS — R102 Pelvic and perineal pain: Secondary | ICD-10-CM

## 2023-12-04 DIAGNOSIS — K5900 Constipation, unspecified: Secondary | ICD-10-CM | POA: Diagnosis not present

## 2023-12-04 DIAGNOSIS — Z9889 Other specified postprocedural states: Secondary | ICD-10-CM | POA: Diagnosis not present

## 2023-12-04 DIAGNOSIS — N941 Unspecified dyspareunia: Secondary | ICD-10-CM | POA: Diagnosis not present

## 2023-12-04 DIAGNOSIS — N3946 Mixed incontinence: Secondary | ICD-10-CM | POA: Diagnosis not present

## 2023-12-04 DIAGNOSIS — L905 Scar conditions and fibrosis of skin: Secondary | ICD-10-CM | POA: Diagnosis not present

## 2023-12-04 DIAGNOSIS — R252 Cramp and spasm: Secondary | ICD-10-CM

## 2023-12-04 NOTE — Patient Instructions (Signed)

## 2023-12-04 NOTE — Therapy (Signed)
 OUTPATIENT PHYSICAL THERAPY FEMALE PELVIC TREATMENT   Patient Name: [name] MRN: [id number] DOB:[birth year], 53 y.o., female Today's Date: 12/04/2023  END OF SESSION:  PT End of Session - 12/04/23 0855     Visit Number 3    Date for PT Re-Evaluation 02/16/24    Authorization Type [employer] medicare    Authorization - Number of Visits 3    Progress Note Due on Visit 10    PT Start Time 0855    PT Stop Time 0925    PT Time Calculation (min) 30 min    Activity Tolerance Patient tolerated treatment well    Behavior During Therapy [facility] for tasks assessed/performed             Past Medical History:  Diagnosis Date   ADD (attention deficit disorder)    Anxiety    Arthropathy, unspecified, site unspecified    Chronic fatigue syndrome    Depression    Disc degeneration    Fibromyalgia    History of kidney stones    last stone in 2010   Migraines    Multilevel degenerative disc disease    cervical and lumbar, has had injections/epidural   Panic attack 2019   PTSD (post-traumatic stress disorder) 2019   Sleep disorder 01/08/2014   Sleep disturbance, unspecified    Symptomatic mammary hypertrophy 09/2018   Unspecified nonpsychotic mental disorder    Past Surgical History:  Procedure Laterality Date   BILATERAL SALPINGECTOMY Bilateral 10/16/2013   Procedure: BILATERAL SALPINGECTOMY;  Surgeon: Lenoard Aden, MD;  Location: [facility];  Service: Gynecology;  Laterality: Bilateral;   BREAST REDUCTION SURGERY Bilateral 10/03/2018   Procedure: BILATERAL BREAST REDUCTION;  Surgeon: Peggye Form, DO;  Location: [facility];  Service: Plastics;  Laterality: Bilateral;   CESAREAN SECTION     x2   CHOLECYSTECTOMY     DIAGNOSTIC LAPAROSCOPY     ENDOVENOUS ABLATION SAPHENOUS VEIN W/ LASER Right 10/26/2023   endovenous laser ablation right greater saphenous vein and stab phlebectomy10-20 incisions right leg by Lemar Livings MD   MANDIBLE SURGERY      REMOVAL OF URINARY SLING N/A 01/09/2018   Procedure: REMOVAL OF MESH URINARY SLING EXTRUSION  CYSTOSTOPY;  Surgeon: Alfredo Martinez, MD;  Location: [facility];  Service: Urology;  Laterality: N/A;   ROBOTIC ASSISTED TOTAL HYSTERECTOMY N/A 10/16/2013   Procedure: ROBOTIC ASSISTED TOTAL HYSTERECTOMY;  Surgeon: Lenoard Aden, MD;  Location: [facility];  Service: Gynecology;  Laterality: N/A;   ROTATOR CUFF REPAIR Left 08/21/2023   Patient Active Problem List   Diagnosis Date Noted   Dyspareunia in female 08/18/2023   Constipation 08/18/2023   Nocturia 08/18/2023   Urinary incontinence, mixed 08/18/2023   History of pelvic surgery 08/18/2023   History of recurrent UTI (urinary tract infection) 08/18/2023   Vaginal atrophy 08/18/2023   Vaginal irritation 08/18/2023   Major depressive disorder, recurrent episode (HCC) 06/19/2022   History of COVID-19 01/21/2020   Chronic cough 01/21/2020   Migraine without status migrainosus, not intractable 01/21/2020   Tinnitus of both ears 01/21/2020   Neck pain 08/18/2018   Back pain 08/18/2018   Symptomatic mammary hypertrophy 08/18/2018   Obesity, Class II, BMI 35-39.9 09/29/2017   Depression 03/31/2017   Thyroid disease 03/31/2017   Sleep disorder 01/08/2014   Fibroids 10/16/2013    PCP: Creola Corn, MD  REFERRING PROVIDER: Loleta Chance, MD   REFERRING DIAG:  N94.10 (ICD-10-CM) - Dyspareunia in female  K46.00 (ICD-10-CM) -  Constipation, unspecified constipation type  R35.1 (ICD-10-CM) - Nocturia  N39.46 (ICD-10-CM) - Urinary incontinence, mixed  Z98.890 (ICD-10-CM) - History of pelvic surgery    THERAPY DIAG:  Muscle weakness (generalized)  Cramp and spasm  Pelvic pain  Scar  Rationale for Evaluation and Treatment: Rehabilitation  ONSET DATE: 2018  SUBJECTIVE:                                                                                                                                                                                            SUBJECTIVE STATEMENT: I have bought the products for the pelvic floor.   Fluid intake: Yes: water but not enough, low fluid intake    PAIN:  Are you having pain? Yes NPRS scale: 5/10 Pain location: Vaginal, feels like the left side gets irritated  Pain type: sharp Pain description: intermittent   Aggravating factors: penile penetration vaginally Relieving factors: no penile penetration  PRECAUTIONS: None  RED FLAGS: None   WEIGHT BEARING RESTRICTIONS: No  FALLS:  Has patient fallen in last 6 months? 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  Patient has stool leakage.    URINATION: Pain with urination: Yes, tingling pin stab  Fully empty bladder: Yes:   Stream: Strong Urgency: Yes:   unable to stop the flow of urine when leaking Frequency: night gets up 1 per night; day  urinate 1-2 times Leakage: Urge to void, Walking to the bathroom, Coughing, Sneezing, Laughing, Lifting, and Bending forward Pads: Yes: 3-5 per day  INTERCOURSE: Pain with intercourse: Initial Penetration and During Penetration, pain on left side Ability to have vaginal penetration:  Yes:   Climax: not often Marinoff Scale: 2/3  PREGNANCY: C-section deliveries 2  OBJECTIVE:  Note: Objective measures were completed at Evaluation unless otherwise noted.  DIAGNOSTIC FINDINGS:  none   COGNITION: Overall cognitive status: Within functional limits for tasks assessed     SENSATION: Light touch: Appears intact Proprioception: Appears  intact    POSTURE: rounded shoulders, forward head, decreased lumbar lordosis, and feet turned outward  PELVIC ALIGNMENT:ASIS are equal   LOWER EXTREMITY EAV:[id number] hip ROM is full   LOWER EXTREMITY MMT: bilateral hip strength is 4-/5   PALPATION:   General  decreased movement of the lower abdominal  scar; fascial tightness in the abdomen.                 External Perineal Exam tenderness located in the clitoris, along the ischiocavernosus, and perineal body                             Internal Pelvic Floor tenderness located in the levator ani, obturator internist, sides of the bladder, urethra, along the anterior wall of the vaginal canal  Patient confirms identification and approves PT to assess internal pelvic floor and treatment Yes  PELVIC MMT:   MMT eval  Vaginal 2/5  Internal Anal Sphincter 3/5  External Anal Sphincter 3/5  Puborectalis 3/5  (Blank rows = not tested)        TONE: Increased tone on the left pelvic floor  PROLAPSE: none  TODAY'S TREATMENT:    12/04/23 Manual: Soft tissue mobilization: Manual work to the diaphragm with breath to improve elongation Scar tissue mobilization: Scar release going through the restrictions by pulling the scar up and moving in different directions Trigger Point Dry Needling  Initial Treatment: Pt instructed on Dry Needling rational, procedures, and possible side effects. Pt instructed to expect mild to moderate muscle soreness later in the day and/or into the next day.  Pt instructed in methods to reduce muscle soreness. Pt instructed to continue prescribed HEP. Because Dry Needling was performed over or adjacent to a lung field, pt was educated on S/S of pneumothorax and to seek immediate medical attention should they occur.  Patient was educated on signs and symptoms of infection and other risk factors and advised to seek medical attention should they occur.  Patient verbalized understanding of these  instructions and education.   Patient Verbal Consent Given: Yes Education Handout Provided: Yes Muscles Treated: along the C-section scar Electrical Stimulation Performed: No Treatment Response/Outcome: elongation of the scar tissue Neuromuscular re-education: Down training: Diaphragmatic breathing to relax the pelvic floor and open up the diaphragm Self-care: Educated patient on the use of the vaginal wand. Had her demonstrate in the clinic, how to work with trigger points, how to place the wand into the canal, using gel on the wand.      12/01/23 Manual: Scar tissue mobilization: Manual work to the lower abdominal scar working through the restrictions Pulling up on the scar to stretch the scar tissue and lengthen Educated patient on scar massage to do at home.  Myofascial release: Fascial release of the lower abdomen going through the restrictions, release around the umbilicus, release along the lateral sides of the abdominus  Self-care: Educated patient on using a vaginal moisturizer of the vulva area and how it improves the tissue health                                                                                                                             DATE: 11/24/23  EVAL see below  PATIENT EDUCATION:  11/24/23 Education details: educated patient on vaginal wand to purchase to work on the pelvic floor trigger points and where to purchase it.  Person educated: Patient Education method: Explanation, Demonstration, Tactile cues, Verbal cues, and Handouts Education comprehension: verbalized understanding, returned demonstration, verbal cues required, tactile cues required, and needs further education  HOME EXERCISE PROGRAM: See above.   ASSESSMENT:  CLINICAL IMPRESSION: Patient is a 53 y.o. female who was seen today for physical therapy  treatment for dyspareunia, constipation, urinary incontinence, and history of pelvic surgery. Patient had less restrictions in the  scar after the manual work. Patient had less swelling in the abdomen. She has gotten the wand to work on the pelvic floor.  Patient will benefit from skilled therapy to improve pelvic floor coordination and strength while reducing her pain.    OBJECTIVE IMPAIRMENTS: decreased activity tolerance, decreased coordination, decreased strength, increased fascial restrictions, increased muscle spasms, and pain.   ACTIVITY LIMITATIONS: lifting, bending, sitting, standing, squatting, continence, toileting, and locomotion level  PARTICIPATION LIMITATIONS: meal prep, cleaning, laundry, interpersonal relationship, driving, shopping, and community activity  PERSONAL FACTORS: Fitness and 3+ comorbidities: ADD; Chronic Fatique Syndrome; Fibromyalgia; PTSD; Cholecystectomy; Cesarean sectoin; Total hysterectomy 2014; bladder sling 2017 with mesh; Mesh removed 2018  are also affecting patient's functional outcome.   REHAB POTENTIAL: Good  CLINICAL DECISION MAKING: Evolving/moderate complexity  EVALUATION COMPLEXITY: Moderate   GOALS: Goals reviewed with patient? Yes  SHORT TERM GOALS: Target date: 12/22/23  Patient educated on using a vaginal wand on the pelvic floor muscles to reduce the trigger points.  Baseline: Goal status: Met 12/04/23  2.  Patient able to perform diaphragmatic breathing to elongate the pelvic floor muscles.  Baseline:  Goal status: INITIAL  3.  Patient educated on initial HEP for flexibility exercises.  Baseline:  Goal status: INITIAL  4.  Patient educated on scar massage to reduce restrictions of the lower abdomen.  Baseline:  Goal status: Met 12/04/23  LONG TERM GOALS: Target date: 02/16/24  Patient is independent with advanced HEP for core and pelvic floor.  Baseline:  Goal status: INITIAL  2.  Patient is able to have penile penetration vaginally due to the reduction of trigger points of the pelvic floor.  Baseline:  Goal status: INITIAL  3.  Patient reports her  urinary leakage decreased >/= 75% due to increased strength of the pelvic floor >/= 3/5 with circular hug of therapist finger.  Baseline:  Goal status: INITIAL  4.  Patient reports her stool leakage has decreased >/= 75% due to reduction of trigger points in the pelvic floor.  Baseline:  Goal status: INITIAL  5.  Patient has improved mobility of the lower abdominal scar so the twisted knot feeling is reduced >/= 50%.  Baseline:  Goal status: INITIAL    PLAN:  PT FREQUENCY: 1-2x/week  PT DURATION: 6 months  PLANNED INTERVENTIONS: 97110-Therapeutic exercises, 97530- Therapeutic activity, 97112- Neuromuscular re-education, 97535- Self Care, 29562- Manual therapy, [phone]- Aquatic Therapy, 97014- Electrical stimulation (unattended), 97035- Ultrasound, Patient/Family education, Dry Needling, Joint mobilization, Spinal mobilization, Scar mobilization, Cryotherapy, Moist heat, and Biofeedback  PLAN FOR NEXT SESSION: manual work to the lower abdominal scar, diaphragmatic breathing, hip stretches, sit on ball to massage the pelvic floor, see how it is going with the wand, toileting    Eulis Foster, PT 12/04/23 9:29 AM

## 2023-12-05 DIAGNOSIS — M25512 Pain in left shoulder: Secondary | ICD-10-CM | POA: Diagnosis not present

## 2023-12-05 DIAGNOSIS — M79602 Pain in left arm: Secondary | ICD-10-CM | POA: Diagnosis not present

## 2023-12-05 DIAGNOSIS — M6281 Muscle weakness (generalized): Secondary | ICD-10-CM | POA: Diagnosis not present

## 2023-12-11 ENCOUNTER — Encounter: Payer: Self-pay | Admitting: Physical Therapy

## 2023-12-11 ENCOUNTER — Ambulatory Visit: Payer: Medicare Other | Admitting: Physical Therapy

## 2023-12-11 DIAGNOSIS — R102 Pelvic and perineal pain: Secondary | ICD-10-CM | POA: Diagnosis not present

## 2023-12-11 DIAGNOSIS — L905 Scar conditions and fibrosis of skin: Secondary | ICD-10-CM | POA: Diagnosis not present

## 2023-12-11 DIAGNOSIS — N3946 Mixed incontinence: Secondary | ICD-10-CM | POA: Diagnosis not present

## 2023-12-11 DIAGNOSIS — R252 Cramp and spasm: Secondary | ICD-10-CM

## 2023-12-11 DIAGNOSIS — R351 Nocturia: Secondary | ICD-10-CM | POA: Diagnosis not present

## 2023-12-11 DIAGNOSIS — N941 Unspecified dyspareunia: Secondary | ICD-10-CM | POA: Diagnosis not present

## 2023-12-11 DIAGNOSIS — Z9889 Other specified postprocedural states: Secondary | ICD-10-CM | POA: Diagnosis not present

## 2023-12-11 DIAGNOSIS — K5900 Constipation, unspecified: Secondary | ICD-10-CM | POA: Diagnosis not present

## 2023-12-11 DIAGNOSIS — M6281 Muscle weakness (generalized): Secondary | ICD-10-CM

## 2023-12-11 NOTE — Therapy (Signed)
 OUTPATIENT PHYSICAL THERAPY FEMALE PELVIC TREATMENT   Patient Name: [name] MRN: [id number] DOB:Feb 16, 1971, 53 y.o., female Today's Date: 12/11/2023  END OF SESSION:  PT End of Session - 12/11/23 0857     Visit Number 4    Date for PT Re-Evaluation 02/16/24    Authorization Type [employer] medicare    Authorization - Number of Visits 4    Progress Note Due on Visit 10    PT Start Time 0857   came late   PT Stop Time 0930    PT Time Calculation (min) 33 min    Activity Tolerance Patient tolerated treatment well    Behavior During Therapy [facility] for tasks assessed/performed             Past Medical History:  Diagnosis Date   ADD (attention deficit disorder)    Anxiety    Arthropathy, unspecified, site unspecified    Chronic fatigue syndrome    Depression    Disc degeneration    Fibromyalgia    History of kidney stones    last stone in 2010   Migraines    Multilevel degenerative disc disease    cervical and lumbar, has had injections/epidural   Panic attack 2019   PTSD (post-traumatic stress disorder) 2019   Sleep disorder 01/08/2014   Sleep disturbance, unspecified    Symptomatic mammary hypertrophy 09/2018   Unspecified nonpsychotic mental disorder    Past Surgical History:  Procedure Laterality Date   BILATERAL SALPINGECTOMY Bilateral 10/16/2013   Procedure: BILATERAL SALPINGECTOMY;  Surgeon: Lenoard Aden, MD;  Location: [facility];  Service: Gynecology;  Laterality: Bilateral;   BREAST REDUCTION SURGERY Bilateral 10/03/2018   Procedure: BILATERAL BREAST REDUCTION;  Surgeon: Peggye Form, DO;  Location: [facility];  Service: Plastics;  Laterality: Bilateral;   CESAREAN SECTION     x2   CHOLECYSTECTOMY     DIAGNOSTIC LAPAROSCOPY     ENDOVENOUS ABLATION SAPHENOUS VEIN W/ LASER Right 10/26/2023   endovenous laser ablation right greater saphenous vein and stab phlebectomy10-20 incisions right leg by Lemar Livings MD   MANDIBLE  SURGERY     REMOVAL OF URINARY SLING N/A 01/09/2018   Procedure: REMOVAL OF MESH URINARY SLING EXTRUSION  CYSTOSTOPY;  Surgeon: Alfredo Martinez, MD;  Location: [facility];  Service: Urology;  Laterality: N/A;   ROBOTIC ASSISTED TOTAL HYSTERECTOMY N/A 10/16/2013   Procedure: ROBOTIC ASSISTED TOTAL HYSTERECTOMY;  Surgeon: Lenoard Aden, MD;  Location: [facility];  Service: Gynecology;  Laterality: N/A;   ROTATOR CUFF REPAIR Left 08/21/2023   Patient Active Problem List   Diagnosis Date Noted   Dyspareunia in female 08/18/2023   Constipation 08/18/2023   Nocturia 08/18/2023   Urinary incontinence, mixed 08/18/2023   History of pelvic surgery 08/18/2023   History of recurrent UTI (urinary tract infection) 08/18/2023   Vaginal atrophy 08/18/2023   Vaginal irritation 08/18/2023   Major depressive disorder, recurrent episode (HCC) 06/19/2022   History of COVID-19 01/21/2020   Chronic cough 01/21/2020   Migraine without status migrainosus, not intractable 01/21/2020   Tinnitus of both ears 01/21/2020   Neck pain 08/18/2018   Back pain 08/18/2018   Symptomatic mammary hypertrophy 08/18/2018   Obesity, Class II, BMI 35-39.9 09/29/2017   Depression 03/31/2017   Thyroid disease 03/31/2017   Sleep disorder 01/08/2014   Fibroids 10/16/2013    PCP: Creola Corn, MD  REFERRING PROVIDER: Loleta Chance, MD   REFERRING DIAG:  N94.10 (ICD-10-CM) - Dyspareunia in female  K59.00 (ICD-10-CM) - Constipation, unspecified constipation type  R35.1 (ICD-10-CM) - Nocturia  N39.46 (ICD-10-CM) - Urinary incontinence, mixed  Z98.890 (ICD-10-CM) - History of pelvic surgery    THERAPY DIAG:  Muscle weakness (generalized)  Cramp and spasm  Pelvic pain  Scar  Rationale for Evaluation and Treatment: Rehabilitation  ONSET DATE: 2018  SUBJECTIVE:                                                                                                                                                                                            SUBJECTIVE STATEMENT: I have a cough and have extra leakage. I will have the bulking and botox done on Thursday. No intercourse so do not know about pain. Bowel movements are still doing good. I have not had any pain on the right lower abdominal.   Fluid intake: Yes: water but not enough, low fluid intake    PAIN:  Are you having pain? Yes NPRS scale: 5/10 Pain location: Vaginal, feels like the left side gets irritated  Pain type: sharp Pain description: intermittent   Aggravating factors: penile penetration vaginally Relieving factors: no penile penetration  PRECAUTIONS: None  RED FLAGS: None   WEIGHT BEARING RESTRICTIONS: No  FALLS:  Has patient fallen in last 6 months? 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  Patient has stool leakage.    URINATION: Pain with urination: Yes, tingling pin stab  Fully empty bladder: Yes:   Stream: Strong Urgency: Yes:   unable to stop the flow of urine when leaking Frequency: night gets up 1 per night; day  urinate 1-2 times Leakage: Urge to void, Walking to the bathroom, Coughing, Sneezing, Laughing, Lifting, and Bending forward Pads: Yes: 3-5 per day  INTERCOURSE: Pain with intercourse: Initial Penetration and During Penetration, pain on left side Ability to have vaginal penetration:  Yes:   Climax: not often Marinoff Scale: 2/3  PREGNANCY: C-section deliveries 2  OBJECTIVE:  Note: Objective measures were completed at Evaluation unless otherwise noted.  DIAGNOSTIC  FINDINGS:  none   COGNITION: Overall cognitive status: Within functional limits for tasks assessed     SENSATION: Light touch: Appears intact Proprioception: Appears intact    POSTURE: rounded shoulders, forward head, decreased lumbar lordosis, and feet turned outward  PELVIC ALIGNMENT:ASIS are equal   LOWER EXTREMITY UEA:[id number] hip ROM is full   LOWER EXTREMITY MMT: bilateral hip strength is 4-/5   PALPATION:   General  decreased movement of the lower abdominal scar; fascial tightness in the abdomen.                 External Perineal Exam tenderness located in the clitoris, along the ischiocavernosus, and perineal body                             Internal Pelvic Floor tenderness located in the levator ani, obturator internist, sides of the bladder, urethra, along the anterior wall of the vaginal canal  Patient confirms identification and approves PT to assess internal pelvic floor and treatment Yes  PELVIC MMT:   MMT eval  Vaginal 2/5  Internal Anal Sphincter 3/5  External Anal Sphincter 3/5  Puborectalis 3/5  (Blank rows = not tested)        TONE: Increased tone on the left pelvic floor  PROLAPSE: none  TODAY'S TREATMENT:  12/11/23 Manual: Scar tissue mobilization: Scar release going through the restrictions by pulling the scar up and moving in different directions Myofascial release: Fascial release along the suprapubic area and along the area of the bladder Trigger Point Dry Needling Subsequent Treatment: Instructions provided previously at initial dry needling treatment.  Instructions reviewed, if requested by the patient, prior to subsequent dry needling treatment.  Patient Verbal Consent Given: Yes Education Handout Provided: Previously Provided Muscles Treated: C-section scar; insertion of the TA muscle along the pubic bone Electrical Stimulation Performed: No Treatment Response/Outcome: elongation of the scar tissue Neuromuscular  re-education: Core retraining: Standing contraction of the abdominals after the manual work Self-care: Educated patient on using the vaginal moisturizers along the vulva area due to the redness and irritation, educated on not using soap on the vulva area and just letting water wash over the area      12/04/23 Manual: Soft tissue mobilization: Manual work to the diaphragm with breath to improve elongation Scar tissue mobilization: Scar release going through the restrictions by pulling the scar up and moving in different directions Trigger Point Dry Needling  Initial Treatment: Pt instructed on Dry Needling rational, procedures, and possible side effects. Pt instructed to expect mild to moderate muscle soreness later in the day and/or into the next day.  Pt instructed in methods to reduce muscle soreness. Pt instructed to continue prescribed HEP. Because Dry Needling was performed over or adjacent to a lung field, pt was educated on S/S of pneumothorax and to seek immediate medical attention should they occur.  Patient was educated on signs and symptoms of infection and other risk factors and advised to seek medical attention should they occur.  Patient verbalized understanding of these instructions and education.   Patient Verbal Consent Given: Yes Education Handout Provided: Yes Muscles Treated: along the C-section scar Electrical Stimulation Performed: No Treatment Response/Outcome: elongation of the scar tissue Neuromuscular re-education: Down training: Diaphragmatic breathing to relax the pelvic floor and open up the diaphragm Self-care: Educated patient on the use of the vaginal wand. Had her demonstrate in the clinic, how to work with trigger points, how to place the wand into the canal, using gel on the wand.      12/01/23 Manual: Scar tissue mobilization: Manual work to the lower abdominal scar working through the restrictions Pulling up on the scar to stretch the scar  tissue and  lengthen Educated patient on scar massage to do at home.  Myofascial release: Fascial release of the lower abdomen going through the restrictions, release around the umbilicus, release along the lateral sides of the abdominus  Self-care: Educated patient on using a vaginal moisturizer of the vulva area and how it improves the tissue health     PATIENT EDUCATION:  11/24/23 Education details: educated patient on vaginal wand to purchase to work on the pelvic floor trigger points and where to purchase it.  Person educated: Patient Education method: Explanation, Demonstration, Tactile cues, Verbal cues, and Handouts Education comprehension: verbalized understanding, returned demonstration, verbal cues required, tactile cues required, and needs further education  HOME EXERCISE PROGRAM: See above.   ASSESSMENT:  CLINICAL IMPRESSION: Patient is a 53 y.o. female who was seen today for physical therapy  treatment for dyspareunia, constipation, urinary incontinence, and history of pelvic surgery. She is having increased urinary leakage due to a cough she has. She is having increased sensation along the scar due to less restrictions. She is able to engage the abdominals better after the manual work. She has irritation of the vulvar area and understands she is to use moisturizers around the area to improve the health of the tissue.  Patient will benefit from skilled therapy to improve pelvic floor coordination and strength while reducing her pain.    OBJECTIVE IMPAIRMENTS: decreased activity tolerance, decreased coordination, decreased strength, increased fascial restrictions, increased muscle spasms, and pain.   ACTIVITY LIMITATIONS: lifting, bending, sitting, standing, squatting, continence, toileting, and locomotion level  PARTICIPATION LIMITATIONS: meal prep, cleaning, laundry, interpersonal relationship, driving, shopping, and community activity  PERSONAL FACTORS: Fitness and 3+  comorbidities: ADD; Chronic Fatique Syndrome; Fibromyalgia; PTSD; Cholecystectomy; Cesarean sectoin; Total hysterectomy 2014; bladder sling 2017 with mesh; Mesh removed 2018  are also affecting patient's functional outcome.   REHAB POTENTIAL: Good  CLINICAL DECISION MAKING: Evolving/moderate complexity  EVALUATION COMPLEXITY: Moderate   GOALS: Goals reviewed with patient? Yes  SHORT TERM GOALS: Target date: 12/22/23  Patient educated on using a vaginal wand on the pelvic floor muscles to reduce the trigger points.  Baseline: Goal status: Met 12/04/23  2.  Patient able to perform diaphragmatic breathing to elongate the pelvic floor muscles.  Baseline:  Goal status: INITIAL  3.  Patient educated on initial HEP for flexibility exercises.  Baseline:  Goal status: INITIAL  4.  Patient educated on scar massage to reduce restrictions of the lower abdomen.  Baseline:  Goal status: Met 12/04/23  LONG TERM GOALS: Target date: 02/16/24  Patient is independent with advanced HEP for core and pelvic floor.  Baseline:  Goal status: INITIAL  2.  Patient is able to have penile penetration vaginally due to the reduction of trigger points of the pelvic floor.  Baseline:  Goal status: INITIAL  3.  Patient reports her urinary leakage decreased >/= 75% due to increased strength of the pelvic floor >/= 3/5 with circular hug of therapist finger.  Baseline:  Goal status: INITIAL  4.  Patient reports her stool leakage has decreased >/= 75% due to reduction of trigger points in the pelvic floor.  Baseline:  Goal status: INITIAL  5.  Patient has improved mobility of the lower abdominal scar so the twisted knot feeling is reduced >/= 50%.  Baseline:  Goal status: INITIAL    PLAN:  PT FREQUENCY: 1-2x/week  PT DURATION: 6 months  PLANNED INTERVENTIONS: 97110-Therapeutic exercises, 97530- Therapeutic activity, [id number]- Neuromuscular re-education, 97535- Self Care, 60454- Manual therapy, [id number]-  Aquatic Therapy, 97014- Electrical stimulation (unattended), 97035- Ultrasound, Patient/Family education, Dry Needling, Joint mobilization, Spinal mobilization, Scar mobilization, Cryotherapy, Moist heat, and Biofeedback  PLAN FOR NEXT SESSION: manual work to the lower abdominal scar, diaphragmatic breathing, hip stretches, sit on ball to massage the pelvic floor, see how it is going with the wand, toileting , internal pelvic floor work   Eulis Foster, PT 12/11/23 9:47 AM

## 2023-12-14 ENCOUNTER — Encounter: Payer: Self-pay | Admitting: Obstetrics

## 2023-12-14 ENCOUNTER — Ambulatory Visit (INDEPENDENT_AMBULATORY_CARE_PROVIDER_SITE_OTHER): Payer: Medicare Other | Admitting: Obstetrics

## 2023-12-14 VITALS — BP 119/84 | HR 89

## 2023-12-14 DIAGNOSIS — N329 Bladder disorder, unspecified: Secondary | ICD-10-CM

## 2023-12-14 DIAGNOSIS — Z87440 Personal history of urinary (tract) infections: Secondary | ICD-10-CM | POA: Diagnosis not present

## 2023-12-14 DIAGNOSIS — Z9889 Other specified postprocedural states: Secondary | ICD-10-CM

## 2023-12-14 DIAGNOSIS — N6342 Unspecified lump in left breast, subareolar: Secondary | ICD-10-CM | POA: Diagnosis not present

## 2023-12-14 DIAGNOSIS — N3946 Mixed incontinence: Secondary | ICD-10-CM

## 2023-12-14 LAB — POCT URINALYSIS DIP (CLINITEK)
Bilirubin, UA: NEGATIVE
Blood, UA: NEGATIVE
Glucose, UA: NEGATIVE mg/dL
Nitrite, UA: NEGATIVE
Spec Grav, UA: >=1.03 — AB (ref 1.010–1.025)
Urobilinogen, UA: 0.2 U/dL
pH, UA: 5.5 (ref 5.0–8.0)

## 2023-12-14 MED ORDER — LIDOCAINE HCL 2 % IJ SOLN: 20.0000 mL | Freq: Once | INTRAMUSCULAR | Status: DC

## 2023-12-14 MED ORDER — ONABOTULINUMTOXINA 100 UNITS IJ SOLR
100.0000 [IU] | Freq: Once | INTRAMUSCULAR | Status: DC
Start: 2023-12-14 — End: 2023-12-14
  Administered 2023-12-14: 100 [IU] via INTRAMUSCULAR

## 2023-12-14 MED ORDER — BOTOX 100 UNITS IJ SOLR: 100.0000 [IU] | Freq: Once | INTRAMUSCULAR | 0 refills | Status: AC

## 2023-12-14 MED ORDER — LIDOCAINE HCL URETHRAL/MUCOSAL 2 % EX GEL
1.0000 | Freq: Once | CUTANEOUS | Status: AC
  Administered 2023-12-14: 1 via URETHRAL

## 2023-12-14 MED ORDER — LIDOCAINE-EPINEPHRINE 1 %-1:100000 IJ SOLN
8.0000 mL | Freq: Once | INTRAMUSCULAR | Status: AC
  Administered 2023-12-14: 8 mL

## 2023-12-14 NOTE — Patient Instructions (Signed)

## 2023-12-14 NOTE — Progress Notes (Signed)
 Intravesical Botox Procedure:  53 y.o. yo F with mixed urinary incontinence, dyspareunia, history of recurrent UT and midurethral sling presenting today for intravesical botox.   Denies UTI symptoms or change in urinary symptoms.  Vitals:   12/14/23 0835  BP: 119/84  Pulse: 89    Results for orders placed or performed in visit on 12/14/23 (from the past 24 hours)  POCT URINALYSIS DIP (CLINITEK)     Status: Abnormal   Collection Time: 12/14/23 11:24 AM  Result Value Ref Range   Color, UA yellow yellow   Clarity, UA clear clear   Glucose, UA negative negative mg/dL   Bilirubin, UA negative negative   Ketones, POC UA trace (5) (A) negative mg/dL   Spec Grav, UA >=2.956 (A) 1.010 - 1.025   Blood, UA negative negative   pH, UA 5.5 5.0 - 8.0   POC PROTEIN,UA trace negative, trace   Urobilinogen, UA 0.2 0.2 or 1.0 E.U./dL   Nitrite, UA Negative Negative   Leukocytes, UA Trace (A) Negative    Prior to the procedure, the patient took macrobid for antibiotic prophylaxis. Time out was performed. The bladder was catherized and 50 ml of 2% lidocaine was placed in the bladder and 10 ml of 2% lidocaine jelly placed in the urethra. A urethral block was performed by injecting 3ml of 1% lidocaine with epinephrine at 3 and 9 o'clock adjacent to the urethra.  After 30 minutes the lidocaine was drained. Cystoscopy was performed with sterile H2O and a 70 degree scope and some resistance was noted during insertion of cystoscope, and gentle serial dilation was performed to a size 22Fr. Cystoscopy was performed with sterile H2O and a 70 degree scope.   Bladder mucosa was noted to be within normal limits, with an area of yellow/white area superior to the right ureteral orifice. A total of 10 ml / 100 units of Botox A,  Lot # [id number] Exp 01/2026  was injected in the detrusor muscle via 5 injections of 2ml each. These were spaced about 1 cm apart. Care was taken to avoid the ureteral orifices and the trigone.    Bulkamid Injection The needle was primed.  The cystoscope was inserted to the level of the bladder neck.  The needle was inserted 2 cm and the scope was pulled back into the urethra 2 cm.  The needle was inserted bevel up at the 5 o'clock position and the Bulkamid was injected to obtain coaptation.  This was repeated at the 2 o'clock,  10 o'clock and 7 o'clock positions.   A total of 2- 1ml syringes were used and good circumferential coaptation was noted.  The patient tolerated the procedure well. She was asked to void after the procedure.    Media Information   Document Information  Photos  R ureteral orifice  12/14/2023 09:26  Attached To:  Office Visit on 12/14/23 with Loleta Chance, MD  Source Information  Loleta Chance, MD  [facility]  Document History     Post-Void Residual (PVR) by Bladder Scan: In order to evaluate bladder emptying, we discussed obtaining a postvoid residual and she agreed to this procedure.  Procedure: The ultrasound unit was placed on the patient's abdomen in the suprapubic region after the patient had voided. A PVR of 37 ml was obtained by bladder scan.   Post Void Residual - 12/14/23 1104       Post Void Residual   Post Void Residual 37 mL  ASSESSMENT: 53 y.o. y.o. s/p transurethral Bulkamid injection for stress incontinence and cystoscopic injection of BOTOX A for detrusor overactivity.   PLAN: Patient will follow up in 4 weeks to reassess. Voiding and post-procedure precautions were given. She will return for heavy bleeding, fevers, dysuria lasting beyond today and incomplete emptying.  - discussed need for bladder biopsy with fulguration and possible injection of triamcinolone. Order placed for surgery scheduling.  All questions were answered.  Loleta Chance, MD

## 2023-12-14 NOTE — Addendum Note (Signed)
 Addended by: Alexis Frock on: 12/14/2023 02:32 PM   Modules accepted: Orders

## 2023-12-15 ENCOUNTER — Encounter: Payer: Self-pay | Admitting: Obstetrics

## 2023-12-15 ENCOUNTER — Ambulatory Visit: Payer: Medicare Other | Admitting: Obstetrics

## 2023-12-15 VITALS — BP 105/72 | HR 98

## 2023-12-15 DIAGNOSIS — N3946 Mixed incontinence: Secondary | ICD-10-CM | POA: Diagnosis not present

## 2023-12-15 DIAGNOSIS — Z87440 Personal history of urinary (tract) infections: Secondary | ICD-10-CM | POA: Diagnosis not present

## 2023-12-15 DIAGNOSIS — F1020 Alcohol dependence, uncomplicated: Secondary | ICD-10-CM | POA: Diagnosis not present

## 2023-12-15 DIAGNOSIS — F332 Major depressive disorder, recurrent severe without psychotic features: Secondary | ICD-10-CM | POA: Diagnosis not present

## 2023-12-15 NOTE — Patient Instructions (Addendum)
 Please return if you experience any change in urinary symptoms.   Do not start self catheterization unless if you experience sensation of incomplete emptying, pain, or lack of ability to void > 3 hrs.

## 2023-12-15 NOTE — Assessment & Plan Note (Signed)
-   denies UTI symptoms, hematuria, dysuria, PVR 30mL - prior POCT with + uro  - For treatment of recurrent urinary tract infections, we discussed management of recurrent UTIs including prophylaxis with a daily low dose antibiotic, transvaginal estrogen therapy, D-mannose, and cranberry supplements.  We discussed the role of diagnostic testing such as cystoscopy and upper tract imaging.   - Rx Pyridium and start ibuprofen as needed UTI symptoms, encouraged to avoid antibiotic exposure if unclear lower urinary tract symptoms - continue vaginal estrogen 0.5 to 1 g twice a week - NuSwab negative for infection - Discussed risk of resistant uropathogens with repeated antibiotic exposure - Rx macrobid to start 3 days prior to procedure

## 2023-12-15 NOTE — Assessment & Plan Note (Signed)
 - s/p urethral bulking and botox injections 12/14/23 - denies leakage, negative CST, PVR 30mL. No bleeding or purulent discharge noted, normal contour of urethral with no blood or purulent discharge expressed with palpation. No enlarged Skene's glands. Reassured pt and advised to return for evaluation if clinical change - advised against starting CIC unless if she experiences sensation of incomplete emptying, pain, or lack of ability to void > 3 hrs.   - UDS prior to midurethral sling in 2018 with bladder capacity, cough induced detrusor overactivity incontinence, pelvic floor dyssynergia and history of dyspareunia with endometriosis s/p pelvic floor PT - UDS 10/25/23 showed + CST, + terminal DO, normal pressure flow and PVR with reduced MCC - continues Gemtesa due to relief, can discontinue after 2-3 weeks of botox injection - avoid anticholinergics due to history of lower extremity edema from Detrol Constant, oxybutynin, Vesicare and chronic constipation - consider bladder pain syndrome due to IBS, migraine and recurrent lower urinary tract symptoms  - We discussed the symptoms of overactive bladder (OAB), which include urinary urgency, urinary frequency, nocturia, with or without urge incontinence.  While we do not know the exact etiology of OAB, several treatment options exist. We discussed management including behavioral therapy (decreasing bladder irritants, urge suppression strategies, timed voids, bladder retraining), physical therapy, medication; for refractory cases posterior tibial nerve stimulation, sacral neuromodulation, and intravesical botulinum toxin injection.  -Encouraged timed voids and bladder training to an increase daytime urinary frequency - pending pelvic floor PT due to pelvic floor myofascial pain on exam - desires to avoid MUS due to history of mid urethral sling and excision - no change in urinary symptoms after PTNS x 4, however improved bowel symptoms  - For refractory OAB we  reviewed the procedure for intravesical Botox injection with cystoscopy in the office and reviewed the risks, benefits and alternatives of treatment including but not limited to infection, need for self-catheterization and need for repeat therapy.  We discussed that there is a 5-15% chance of needing to catheterize with Botox and that this usually resolves in a few months; however can persist for longer periods of time.  Typically Botox injections would need to be repeated every 3-12 months since this is not a permanent therapy. Desires to proceed  We discussed the role of sacral neuromodulation and how it works. It requires a test phase, and documentation of bladder function via diary. After a successful test period, a permanent wire and generator are placed in the OR. The battery lasts 5 years on average and would need to be replaced surgically.  The goal of this therapy is at least a 50% improvement in symptoms. It is NOT realistic to expect a 100% cure.  We reviewed the fact that about 30% of patients fail the test phase and are not candidates for permanent generator placement.  We discussed the risk of infection and that the patient would not be able to get an MRI once the device is placed. There are two companies that provide this therapy: [employer] and [employer]. [employer]' product is new and is similar to [employer]'s, but has advantages of a smaller and rechargeable battery and being able to have an MRI with the implant. For all procedures, we discussed risks of bleeding, infection, damage to surrounding organs including bowel, bladder, blood vessels, ureters and nerves, need for further surgery, risk of postoperative urinary incontinence or retention with need to catheterize, recurrent prolapse, numbness and weakness at any body site, buttock pain, and the rarer risks of  blood clot, heart attack, pneumonia, death.    - positive cough stress test for stress urinary incontinence - For treatment of stress  urinary incontinence,  non-surgical options include expectant management, weight loss, physical therapy, as well as a pessary.  Surgical options include a midurethral sling, Burch urethropexy, and transurethral injection of a bulking agent. She prefers an office procedure with urethral bulking (Bulkamid). We discussed success rate of approximately 70-80% and possible need for second injection. We reviewed that this is not a permanent procedure and the Bulkamid does dissolve over time. Risks reviewed including injury to bladder or urethra, UTI, urinary retention and hematuria.

## 2023-12-15 NOTE — Progress Notes (Signed)
 [facility] Return Visit  SUBJECTIVE  History of Present Illness: [name] is a 53 y.o. female seen in follow-up for sensation of incomplete emptying after bladder botox injection and urethral bulking 12/14/23.   Denies leakage, reports significant amount of bowel movements yesterday after procedure and passing gas when she voided. Reports concerns of inability to void this morning due to area of swelling noted at urethra. Denies hematuria, dysuria, pain. Denies fever, chills, N/V, one sided back pain.  Past Medical History: Patient  has a past medical history of ADD (attention deficit disorder), Anxiety, Arthropathy, unspecified, site unspecified, Chronic fatigue syndrome, Depression, Disc degeneration, Fibromyalgia, History of kidney stones, Migraines, Multilevel degenerative disc disease, Panic attack (2019), PTSD (post-traumatic stress disorder) (2019), Sleep disorder (01/08/2014), Sleep disturbance, unspecified, Symptomatic mammary hypertrophy (09/2018), and Unspecified nonpsychotic mental disorder.   Past Surgical History: She  has a past surgical history that includes Mandible surgery; Cholecystectomy; Cesarean section; Diagnostic laparoscopy; Robotic assisted total hysterectomy (N/A, 10/16/2013); Bilateral salpingectomy (Bilateral, 10/16/2013); Removal of urinary sling (N/A, 01/09/2018); Breast reduction surgery (Bilateral, 10/03/2018); Rotator cuff repair (Left, 08/21/2023); and Endovenous ablation saphenous vein w/ laser (Right, 10/26/2023).   Medications: She has a current medication list which includes the following prescription(s): alprazolam, auvelity, estradiol, naltrexone, trazodone, and gemtesa, and the following Facility-Administered Medications: lidocaine.   Allergies: Patient is allergic to mirabegron, sulfa antibiotics, elavil [amitriptyline hcl], iodinated contrast media, and lyrica [pregabalin].   Social History: Patient  reports that she  has never smoked. She has never used smokeless tobacco. She reports current alcohol use. She reports current drug use. Drug: Marijuana.     OBJECTIVE     Physical Exam: Vitals:   12/15/23 0906  BP: 105/72  Pulse: 98   Physical Exam Constitutional:      General: She is not in acute distress.    Appearance: Normal appearance.  Genitourinary:     Bladder and urethral meatus normal.     No lesions in the vagina.     Genitourinary Comments: Reviewed anatomy with patient, reassured lump at urethra as urethral meatus     Right Labia: No rash, tenderness, lesions, skin changes or Bartholin's cyst.    Left Labia: No tenderness, lesions, skin changes, Bartholin's cyst or rash.    No vaginal discharge, erythema, tenderness, bleeding, ulceration or granulation tissue.     No vaginal prolapse present.    Urethral meatus caruncle not present.    No urethral prolapse, tenderness, mass, hypermobility, discharge or stress urinary incontinence with cough stress test present.     Bladder urgency on palpation.     Bladder is not tender and masses not present.      Levator ani is tender (bilaterally).  Cardiovascular:     Rate and Rhythm: Normal rate.  Pulmonary:     Effort: Pulmonary effort is normal. No respiratory distress.  Abdominal:     General: There is no distension.     Tenderness: There is no abdominal tenderness.  Neurological:     Mental Status: She is alert.  Vitals reviewed. Exam conducted with a chaperone present.   Straight Catheterization Procedure for PVR: After verbal consent was obtained from the patient for catheterization to assess bladder emptying and residual volume the urethra and surrounding tissues were prepped with betadine and an in and out catheterization was performed.  PVR was 25mL.  Urine appeared clear yellow. The patient tolerated the procedure well.      No data to display  ASSESSMENT AND PLAN    Ms. Aveni is a 53 y.o. with:  1. Urinary  incontinence, mixed   2. History of recurrent UTI (urinary tract infection)     Urinary incontinence, mixed Assessment & Plan: - s/p urethral bulking and botox injections 12/14/23 - denies leakage, negative CST, PVR 30mL. No bleeding or purulent discharge noted, normal contour of urethral with no blood or purulent discharge expressed with palpation. No enlarged Skene's glands. Reassured pt and advised to return for evaluation if clinical change - advised against starting CIC unless if she experiences sensation of incomplete emptying, pain, or lack of ability to void > 3 hrs.   - UDS prior to midurethral sling in 2018 with bladder capacity, cough induced detrusor overactivity incontinence, pelvic floor dyssynergia and history of dyspareunia with endometriosis s/p pelvic floor PT - UDS 10/25/23 showed + CST, + terminal DO, normal pressure flow and PVR with reduced MCC - continues Gemtesa due to relief, can discontinue after 2-3 weeks of botox injection - avoid anticholinergics due to history of lower extremity edema from Detrol Constant, oxybutynin, Vesicare and chronic constipation - consider bladder pain syndrome due to IBS, migraine and recurrent lower urinary tract symptoms  - We discussed the symptoms of overactive bladder (OAB), which include urinary urgency, urinary frequency, nocturia, with or without urge incontinence.  While we do not know the exact etiology of OAB, several treatment options exist. We discussed management including behavioral therapy (decreasing bladder irritants, urge suppression strategies, timed voids, bladder retraining), physical therapy, medication; for refractory cases posterior tibial nerve stimulation, sacral neuromodulation, and intravesical botulinum toxin injection.  -Encouraged timed voids and bladder training to an increase daytime urinary frequency - pending pelvic floor PT due to pelvic floor myofascial pain on exam - desires to avoid MUS due to history of mid  urethral sling and excision - no change in urinary symptoms after PTNS x 4, however improved bowel symptoms  - For refractory OAB we reviewed the procedure for intravesical Botox injection with cystoscopy in the office and reviewed the risks, benefits and alternatives of treatment including but not limited to infection, need for self-catheterization and need for repeat therapy.  We discussed that there is a 5-15% chance of needing to catheterize with Botox and that this usually resolves in a few months; however can persist for longer periods of time.  Typically Botox injections would need to be repeated every 3-12 months since this is not a permanent therapy. Desires to proceed  We discussed the role of sacral neuromodulation and how it works. It requires a test phase, and documentation of bladder function via diary. After a successful test period, a permanent wire and generator are placed in the OR. The battery lasts 5 years on average and would need to be replaced surgically.  The goal of this therapy is at least a 50% improvement in symptoms. It is NOT realistic to expect a 100% cure.  We reviewed the fact that about 30% of patients fail the test phase and are not candidates for permanent generator placement.  We discussed the risk of infection and that the patient would not be able to get an MRI once the device is placed. There are two companies that provide this therapy: [employer] and [employer]. [employer]' product is new and is similar to [employer]'s, but has advantages of a smaller and rechargeable battery and being able to have an MRI with the implant. For all procedures, we discussed risks of bleeding, infection, damage to surrounding organs  including bowel, bladder, blood vessels, ureters and nerves, need for further surgery, risk of postoperative urinary incontinence or retention with need to catheterize, recurrent prolapse, numbness and weakness at any body site, buttock pain, and the rarer risks of blood  clot, heart attack, pneumonia, death.    - positive cough stress test for stress urinary incontinence - For treatment of stress urinary incontinence,  non-surgical options include expectant management, weight loss, physical therapy, as well as a pessary.  Surgical options include a midurethral sling, Burch urethropexy, and transurethral injection of a bulking agent. She prefers an office procedure with urethral bulking (Bulkamid). We discussed success rate of approximately 70-80% and possible need for second injection. We reviewed that this is not a permanent procedure and the Bulkamid does dissolve over time. Risks reviewed including injury to bladder or urethra, UTI, urinary retention and hematuria.    History of recurrent UTI (urinary tract infection) Assessment & Plan: - denies UTI symptoms, hematuria, dysuria, PVR 30mL - prior POCT with + uro  - For treatment of recurrent urinary tract infections, we discussed management of recurrent UTIs including prophylaxis with a daily low dose antibiotic, transvaginal estrogen therapy, D-mannose, and cranberry supplements.  We discussed the role of diagnostic testing such as cystoscopy and upper tract imaging.   - Rx Pyridium and start ibuprofen as needed UTI symptoms, encouraged to avoid antibiotic exposure if unclear lower urinary tract symptoms - continue vaginal estrogen 0.5 to 1 g twice a week - NuSwab negative for infection - Discussed risk of resistant uropathogens with repeated antibiotic exposure - Rx macrobid to start 3 days prior to procedure   Time spent: I spent 17 minutes dedicated to the care of this patient on the date of this encounter to include pre-visit review of records, face-to-face time with the patient discussing mixed urinary incontinence, recurrent UTI, and post visit documentation.    Loleta Chance, MD

## 2023-12-18 DIAGNOSIS — M79602 Pain in left arm: Secondary | ICD-10-CM | POA: Diagnosis not present

## 2023-12-18 DIAGNOSIS — M25512 Pain in left shoulder: Secondary | ICD-10-CM | POA: Diagnosis not present

## 2023-12-18 DIAGNOSIS — M6281 Muscle weakness (generalized): Secondary | ICD-10-CM | POA: Diagnosis not present

## 2023-12-22 ENCOUNTER — Ambulatory Visit: Payer: Self-pay | Attending: Obstetrics | Admitting: Physical Therapy

## 2023-12-22 ENCOUNTER — Telehealth: Payer: Self-pay

## 2023-12-22 ENCOUNTER — Encounter: Payer: Self-pay | Admitting: Physical Therapy

## 2023-12-22 DIAGNOSIS — M6281 Muscle weakness (generalized): Secondary | ICD-10-CM | POA: Insufficient documentation

## 2023-12-22 DIAGNOSIS — L905 Scar conditions and fibrosis of skin: Secondary | ICD-10-CM | POA: Diagnosis not present

## 2023-12-22 DIAGNOSIS — R102 Pelvic and perineal pain: Secondary | ICD-10-CM | POA: Diagnosis not present

## 2023-12-22 DIAGNOSIS — R252 Cramp and spasm: Secondary | ICD-10-CM | POA: Insufficient documentation

## 2023-12-22 NOTE — Telephone Encounter (Addendum)
 I spoke to [name] late afternoon on 12-21-2023. She just had a few follow up question about her surgery and biopsy on 01-15-2024. Patient was just needing some reassurance. She also had questions about  Vivally device and if we had to have it approved before she could proceed. Per Waldo Laine, she sent her info in and she will follow up with the representative.

## 2023-12-22 NOTE — Therapy (Signed)
 OUTPATIENT PHYSICAL THERAPY FEMALE PELVIC TREATMENT   Patient Name: [name] MRN: [id number] DOB:12/17/1970, 53 y.o., female Today's Date: 12/22/2023  END OF SESSION:  PT End of Session - 12/22/23 0934     Visit Number 5    Date for PT Re-Evaluation 02/16/24    Authorization Type [employer] medicare    Authorization - Number of Visits 5    Progress Note Due on Visit 10    PT Start Time 0930    PT Stop Time 1010    PT Time Calculation (min) 40 min    Activity Tolerance Patient tolerated treatment well    Behavior During Therapy WFL for tasks assessed/performed             Past Medical History:  Diagnosis Date   ADD (attention deficit disorder)    Anxiety    Arthropathy, unspecified, site unspecified    Chronic fatigue syndrome    Depression    Disc degeneration    Fibromyalgia    History of kidney stones    last stone in 2010   Migraines    Multilevel degenerative disc disease    cervical and lumbar, has had injections/epidural   Panic attack 2019   PTSD (post-traumatic stress disorder) 2019   Sleep disorder 01/08/2014   Sleep disturbance, unspecified    Symptomatic mammary hypertrophy 09/2018   Unspecified nonpsychotic mental disorder    Past Surgical History:  Procedure Laterality Date   BILATERAL SALPINGECTOMY Bilateral 10/16/2013   Procedure: BILATERAL SALPINGECTOMY;  Surgeon: Lenoard Aden, MD;  Location: [facility];  Service: Gynecology;  Laterality: Bilateral;   BREAST REDUCTION SURGERY Bilateral 10/03/2018   Procedure: BILATERAL BREAST REDUCTION;  Surgeon: Peggye Form, DO;  Location: [facility];  Service: Plastics;  Laterality: Bilateral;   CESAREAN SECTION     x2   CHOLECYSTECTOMY     DIAGNOSTIC LAPAROSCOPY     ENDOVENOUS ABLATION SAPHENOUS VEIN W/ LASER Right 10/26/2023   endovenous laser ablation right greater saphenous vein and stab phlebectomy10-20 incisions right leg by Lemar Livings MD   MANDIBLE SURGERY      REMOVAL OF URINARY SLING N/A 01/09/2018   Procedure: REMOVAL OF MESH URINARY SLING EXTRUSION  CYSTOSTOPY;  Surgeon: Alfredo Martinez, MD;  Location: [facility];  Service: Urology;  Laterality: N/A;   ROBOTIC ASSISTED TOTAL HYSTERECTOMY N/A 10/16/2013   Procedure: ROBOTIC ASSISTED TOTAL HYSTERECTOMY;  Surgeon: Lenoard Aden, MD;  Location: [facility];  Service: Gynecology;  Laterality: N/A;   ROTATOR CUFF REPAIR Left 08/21/2023   Patient Active Problem List   Diagnosis Date Noted   Dyspareunia in female 08/18/2023   Constipation 08/18/2023   Nocturia 08/18/2023   Urinary incontinence, mixed 08/18/2023   History of pelvic surgery 08/18/2023   History of recurrent UTI (urinary tract infection) 08/18/2023   Vaginal atrophy 08/18/2023   Vaginal irritation 08/18/2023   Major depressive disorder, recurrent episode (HCC) 06/19/2022   History of COVID-19 01/21/2020   Chronic cough 01/21/2020   Migraine without status migrainosus, not intractable 01/21/2020   Tinnitus of both ears 01/21/2020   Neck pain 08/18/2018   Back pain 08/18/2018   Symptomatic mammary hypertrophy 08/18/2018   Obesity, Class II, BMI 35-39.9 09/29/2017   Depression 03/31/2017   Thyroid disease 03/31/2017   Sleep disorder 01/08/2014   Fibroids 10/16/2013    PCP: Creola Corn, MD  REFERRING PROVIDER: Loleta Chance, MD   REFERRING DIAG:  N94.10 (ICD-10-CM) - Dyspareunia in female  K67.00 (ICD-10-CM) -  Constipation, unspecified constipation type  R35.1 (ICD-10-CM) - Nocturia  N39.46 (ICD-10-CM) - Urinary incontinence, mixed  Z98.890 (ICD-10-CM) - History of pelvic surgery    THERAPY DIAG:  Muscle weakness (generalized)  Cramp and spasm  Pelvic pain  Scar  Rationale for Evaluation and Treatment: Rehabilitation  ONSET DATE: 2018  SUBJECTIVE:                                                                                                                                                                                            SUBJECTIVE STATEMENT: I had the Botox and the filler to the urethra last Thursday. I am not leaking with sneezing and coughing. I am seeing the bowels area moving and having a bowel movement daily. They found a spot on my bladder and going to do a biopsy. I get a lot of UTI and infections. I ma using the vaginal moisturizer.  Fluid intake: Yes: water but not enough, low fluid intake    PAIN:  Are you having pain? Yes NPRS scale: 5/10 Pain location: Vaginal, feels like the left side gets irritated  Pain type: sharp Pain description: intermittent   Aggravating factors: penile penetration vaginally Relieving factors: no penile penetration  PRECAUTIONS: None  RED FLAGS: None   WEIGHT BEARING RESTRICTIONS: No  FALLS:  Has patient fallen in last 6 months? 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  Patient has stool leakage.    URINATION: Pain with urination: Yes, tingling pin stab  Fully empty bladder: Yes:   Stream: Strong Urgency: Yes:   unable to stop the flow of urine when leaking Frequency: night gets up 1 per night; day  urinate 1-2 times Leakage: Urge to void, Walking to the bathroom, Coughing, Sneezing, Laughing, Lifting, and Bending forward Pads: Yes: 3-5 per day  INTERCOURSE: Pain with intercourse: Initial Penetration and During Penetration, pain on left side Ability to have vaginal penetration:  Yes:   Climax: not often Marinoff Scale: 2/3  PREGNANCY: C-section deliveries 2  OBJECTIVE:  Note: Objective measures were  completed at Evaluation unless otherwise noted.  DIAGNOSTIC FINDINGS:  none   COGNITION: Overall cognitive status: Within functional limits for tasks assessed     SENSATION: Light touch: Appears intact Proprioception: Appears intact  POSTURE: rounded shoulders, forward head, decreased lumbar lordosis, and feet turned outward  PELVIC ALIGNMENT:ASIS are equal   LOWER EXTREMITY EAV:[id number] hip ROM is full   LOWER EXTREMITY MMT: bilateral hip strength is 4-/5   PALPATION:   General  decreased movement of the lower abdominal scar; fascial tightness in the abdomen.                 External Perineal Exam tenderness located in the clitoris, along the ischiocavernosus, and perineal body                             Internal Pelvic Floor tenderness located in the levator ani, obturator internist, sides of the bladder, urethra, along the anterior wall of the vaginal canal  Patient confirms identification and approves PT to assess internal pelvic floor and treatment Yes  PELVIC MMT:   MMT eval  Vaginal 2/5  Internal Anal Sphincter 3/5  External Anal Sphincter 3/5  Puborectalis 3/5  (Blank rows = not tested)        TONE: Increased tone on the left pelvic floor  PROLAPSE: none  TODAY'S TREATMENT:  12/22/23 Manual: Scar tissue mobilization: Manual work to the lower abdominal scar to go through the restrictions and elongate the tissue Myofascial release: Fascial release in supine to the lower abdomin to go through the restrictions and improve tissue mobility and improve intestinal mobility Quadruped mobilization around the bladder and lower abdomen with different trunk movements Self-care: Education on using the vaginal wand, using it in the bathtub and tips of holding the wand and placing it into the vaginal canal Discussed with patient on the importance of the vaginal moisturizer to assist with reduction of UTI's and reviewed the placement of the moisturizer.      12/11/23 Manual: Scar tissue mobilization: Scar release going through the restrictions by pulling the scar up and moving in different directions Myofascial release: Fascial release along the suprapubic area and along the area of the bladder Trigger Point Dry Needling Subsequent Treatment: Instructions provided previously at initial dry needling treatment.  Instructions reviewed, if requested by the patient, prior to subsequent dry needling treatment.  Patient Verbal Consent Given: Yes Education Handout Provided: Previously Provided Muscles Treated: C-section scar; insertion of the TA muscle along the pubic bone Electrical Stimulation Performed: No Treatment Response/Outcome: elongation of the scar tissue Neuromuscular re-education: Core retraining: Standing contraction of the abdominals after the manual work Self-care: Educated patient on using the vaginal moisturizers along the vulva area due to the redness and irritation, educated on not using soap on the vulva area and just letting water wash over the area      12/04/23 Manual: Soft tissue mobilization: Manual work to the diaphragm with breath to improve elongation Scar tissue mobilization: Scar release going through the restrictions by pulling the scar up and moving in different directions Trigger Point Dry Needling  Initial Treatment: Pt instructed on Dry Needling rational, procedures, and possible side effects. Pt instructed to expect mild to moderate muscle soreness later in the day and/or into the next day.  Pt instructed in methods to reduce muscle soreness. Pt instructed to continue prescribed HEP. Because Dry Needling was performed over or adjacent to a lung field, pt was educated on S/S of pneumothorax and to seek immediate medical attention should they occur.  Patient was educated on signs and symptoms of infection and other risk factors and advised to seek medical attention should they occur.  Patient verbalized  understanding of these instructions and education.   Patient Verbal Consent Given: Yes Education Handout Provided: Yes Muscles Treated: along the C-section scar Electrical Stimulation Performed: No Treatment Response/Outcome: elongation of the scar tissue Neuromuscular re-education: Down training: Diaphragmatic breathing to relax the pelvic floor and open up the diaphragm Self-care: Educated patient on the use of the vaginal wand. Had her demonstrate in the clinic, how to work with trigger points, how to place the wand into the canal, using gel on the wand.      PATIENT EDUCATION:  11/24/23 Education details: educated patient on vaginal wand to purchase to work on the pelvic floor trigger points and where to purchase it.  Person educated: Patient Education method: Explanation, Demonstration, Tactile cues, Verbal cues, and Handouts Education comprehension: verbalized understanding, returned demonstration, verbal cues required, tactile cues required, and needs further education  HOME EXERCISE PROGRAM: See above.   ASSESSMENT:  CLINICAL IMPRESSION: Patient is a 53 y.o. female who was seen today for physical therapy  treatment for dyspareunia, constipation, urinary incontinence, and history of pelvic surgery. Patient had the Botox and urethra bulking last Thursday. She is now not having urinary leakage. She is having a bowel movement daily.   Patient is using the vaginal moisturizer daily. She had increased mobility of the lower abdominal tissue. The right side of the scar is not indented anymore. Patient will benefit from skilled therapy to improve pelvic floor coordination and strength while reducing her pain.    OBJECTIVE IMPAIRMENTS: decreased activity tolerance, decreased coordination, decreased strength, increased fascial restrictions, increased muscle spasms, and pain.   ACTIVITY LIMITATIONS: lifting, bending, sitting, standing, squatting, continence, toileting, and locomotion  level  PARTICIPATION LIMITATIONS: meal prep, cleaning, laundry, interpersonal relationship, driving, shopping, and community activity  PERSONAL FACTORS: Fitness and 3+ comorbidities: ADD; Chronic Fatique Syndrome; Fibromyalgia; PTSD; Cholecystectomy; Cesarean sectoin; Total hysterectomy 2014; bladder sling 2017 with mesh; Mesh removed 2018  are also affecting patient's functional outcome.   REHAB POTENTIAL: Good  CLINICAL DECISION MAKING: Evolving/moderate complexity  EVALUATION COMPLEXITY: Moderate   GOALS: Goals reviewed with patient? Yes  SHORT TERM GOALS: Target date: 12/22/23  Patient educated on using a vaginal wand on the pelvic floor muscles to reduce the trigger points.  Baseline: Goal status: Met 12/04/23  2.  Patient able to perform diaphragmatic breathing to elongate the pelvic floor muscles.  Baseline:  Goal status: INITIAL  3.  Patient educated on initial HEP for flexibility exercises.  Baseline:  Goal status: Met 12/22/23  4.  Patient educated on scar massage to reduce restrictions of the lower abdomen.  Baseline:  Goal status: Met 12/04/23  LONG TERM GOALS: Target date: 02/16/24  Patient is independent with advanced HEP for core and pelvic floor.  Baseline:  Goal status: INITIAL  2.  Patient is able to have penile penetration vaginally due to the reduction of trigger points of the pelvic floor.  Baseline:  Goal status: INITIAL  3.  Patient reports her urinary leakage decreased >/= 75% due to increased strength of the pelvic floor >/= 3/5 with circular hug of therapist finger.  Baseline:  Goal status: INITIAL  4.  Patient reports her stool leakage has decreased >/= 75% due to reduction of trigger points in the pelvic floor.  Baseline:  Goal status: INITIAL  5.  Patient has improved mobility of the lower abdominal scar so the twisted knot feeling is reduced >/= 50%.  Baseline:  Goal status: INITIAL    PLAN:  PT FREQUENCY:  1-2x/week  PT DURATION: 6  months  PLANNED INTERVENTIONS: 97110-Therapeutic exercises, 97530- Therapeutic activity, 97112- Neuromuscular re-education, 97535- Self Care, 16109- Manual therapy, [id number]- Aquatic Therapy, 97014- Electrical stimulation (unattended), 97035- Ultrasound, Patient/Family education, Dry Needling, Joint mobilization, Spinal mobilization, Scar mobilization, Cryotherapy, Moist heat, and Biofeedback  PLAN FOR NEXT SESSION: manual work to the lower abdominal scar, diaphragmatic breathing,  see how it is going with the wand, toileting , internal pelvic floor work   Eulis Foster, PT 12/22/23 10:17 AM

## 2023-12-25 DIAGNOSIS — M79602 Pain in left arm: Secondary | ICD-10-CM | POA: Diagnosis not present

## 2023-12-25 DIAGNOSIS — M25512 Pain in left shoulder: Secondary | ICD-10-CM | POA: Diagnosis not present

## 2023-12-25 DIAGNOSIS — M6281 Muscle weakness (generalized): Secondary | ICD-10-CM | POA: Diagnosis not present

## 2023-12-26 ENCOUNTER — Encounter: Attending: Obstetrics | Admitting: Physical Therapy

## 2023-12-26 ENCOUNTER — Encounter: Payer: Self-pay | Admitting: Physical Therapy

## 2023-12-26 DIAGNOSIS — R252 Cramp and spasm: Secondary | ICD-10-CM | POA: Diagnosis not present

## 2023-12-26 DIAGNOSIS — L905 Scar conditions and fibrosis of skin: Secondary | ICD-10-CM

## 2023-12-26 DIAGNOSIS — R3915 Urgency of urination: Secondary | ICD-10-CM | POA: Diagnosis not present

## 2023-12-26 DIAGNOSIS — M6281 Muscle weakness (generalized): Secondary | ICD-10-CM | POA: Diagnosis not present

## 2023-12-26 DIAGNOSIS — R102 Pelvic and perineal pain unspecified side: Secondary | ICD-10-CM

## 2023-12-26 DIAGNOSIS — R32 Unspecified urinary incontinence: Secondary | ICD-10-CM | POA: Diagnosis not present

## 2023-12-26 NOTE — Therapy (Signed)
 OUTPATIENT PHYSICAL THERAPY FEMALE PELVIC TREATMENT   Patient Name: [name] MRN: [id number] DOB:1971-05-22, 53 y.o., female Today's Date: 12/26/2023  END OF SESSION:  PT End of Session - 12/26/23 1601     Visit Number 6    Date for PT Re-Evaluation 02/16/24    Authorization Type [employer] medicare    Authorization - Number of Visits 6    Progress Note Due on Visit 10    PT Start Time 1600    PT Stop Time 1645    PT Time Calculation (min) 45 min    Activity Tolerance Patient tolerated treatment well    Behavior During Therapy WFL for tasks assessed/performed             Past Medical History:  Diagnosis Date   ADD (attention deficit disorder)    Anxiety    Arthropathy, unspecified, site unspecified    Chronic fatigue syndrome    Depression    Disc degeneration    Fibromyalgia    History of kidney stones    last stone in 2010   Migraines    Multilevel degenerative disc disease    cervical and lumbar, has had injections/epidural   Panic attack 2019   PTSD (post-traumatic stress disorder) 2019   Sleep disorder 01/08/2014   Sleep disturbance, unspecified    Symptomatic mammary hypertrophy 09/2018   Unspecified nonpsychotic mental disorder    Past Surgical History:  Procedure Laterality Date   BILATERAL SALPINGECTOMY Bilateral 10/16/2013   Procedure: BILATERAL SALPINGECTOMY;  Surgeon: Lenoard Aden, MD;  Location: [facility];  Service: Gynecology;  Laterality: Bilateral;   BREAST REDUCTION SURGERY Bilateral 10/03/2018   Procedure: BILATERAL BREAST REDUCTION;  Surgeon: Peggye Form, DO;  Location: [facility];  Service: Plastics;  Laterality: Bilateral;   CESAREAN SECTION     x2   CHOLECYSTECTOMY     DIAGNOSTIC LAPAROSCOPY     ENDOVENOUS ABLATION SAPHENOUS VEIN W/ LASER Right 10/26/2023   endovenous laser ablation right greater saphenous vein and stab phlebectomy10-20 incisions right leg by Lemar Livings MD   MANDIBLE SURGERY      REMOVAL OF URINARY SLING N/A 01/09/2018   Procedure: REMOVAL OF MESH URINARY SLING EXTRUSION  CYSTOSTOPY;  Surgeon: Alfredo Martinez, MD;  Location: [facility];  Service: Urology;  Laterality: N/A;   ROBOTIC ASSISTED TOTAL HYSTERECTOMY N/A 10/16/2013   Procedure: ROBOTIC ASSISTED TOTAL HYSTERECTOMY;  Surgeon: Lenoard Aden, MD;  Location: [facility];  Service: Gynecology;  Laterality: N/A;   ROTATOR CUFF REPAIR Left 08/21/2023   Patient Active Problem List   Diagnosis Date Noted   Dyspareunia in female 08/18/2023   Constipation 08/18/2023   Nocturia 08/18/2023   Urinary incontinence, mixed 08/18/2023   History of pelvic surgery 08/18/2023   History of recurrent UTI (urinary tract infection) 08/18/2023   Vaginal atrophy 08/18/2023   Vaginal irritation 08/18/2023   Major depressive disorder, recurrent episode (HCC) 06/19/2022   History of COVID-19 01/21/2020   Chronic cough 01/21/2020   Migraine without status migrainosus, not intractable 01/21/2020   Tinnitus of both ears 01/21/2020   Neck pain 08/18/2018   Back pain 08/18/2018   Symptomatic mammary hypertrophy 08/18/2018   Obesity, Class II, BMI 35-39.9 09/29/2017   Depression 03/31/2017   Thyroid disease 03/31/2017   Sleep disorder 01/08/2014   Fibroids 10/16/2013    PCP: Creola Corn, MD  REFERRING PROVIDER: Loleta Chance, MD   REFERRING DIAG:  N94.10 (ICD-10-CM) - Dyspareunia in female  K21.00 (ICD-10-CM) -  Constipation, unspecified constipation type  R35.1 (ICD-10-CM) - Nocturia  N39.46 (ICD-10-CM) - Urinary incontinence, mixed  Z98.890 (ICD-10-CM) - History of pelvic surgery    THERAPY DIAG:  Muscle weakness (generalized)  Cramp and spasm  Pelvic pain  Scar  Rationale for Evaluation and Treatment: Rehabilitation  ONSET DATE: 2018  SUBJECTIVE:                                                                                                                                                                                            SUBJECTIVE STATEMENT: Everything is feeling good. Not leaking urine with coughing and sneezing. No bladder spasm yet. No intercourse at this time.   Fluid intake: Yes: water but not enough, low fluid intake    PAIN:  Are you having pain? Yes NPRS scale: 5/10 Pain location: Vaginal, feels like the left side gets irritated  Pain type: sharp Pain description: intermittent   Aggravating factors: penile penetration vaginally Relieving factors: no penile penetration  PRECAUTIONS: None  RED FLAGS: None   WEIGHT BEARING RESTRICTIONS: No  FALLS:  Has patient fallen in last 6 months? 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  Patient has stool leakage.    URINATION: Pain with urination: Yes, tingling pin stab  Fully empty bladder: Yes:   Stream: Strong Urgency: Yes:   unable to stop the flow of urine when leaking Frequency: night gets up 1 per night; day  urinate 1-2 times Leakage: Urge to void, Walking to the bathroom, Coughing, Sneezing, Laughing, Lifting, and Bending forward Pads: Yes: 3-5 per day  INTERCOURSE: Pain with intercourse: Initial Penetration and During Penetration, pain on left side Ability to have vaginal penetration:  Yes:   Climax: not often Marinoff Scale: 2/3  PREGNANCY: C-section deliveries 2  OBJECTIVE:  Note: Objective measures were completed at Evaluation unless otherwise noted.  DIAGNOSTIC FINDINGS:  none   COGNITION: Overall cognitive status: Within functional limits for tasks  assessed     SENSATION: Light touch: Appears intact Proprioception: Appears intact    POSTURE: rounded shoulders, forward head, decreased lumbar lordosis, and feet turned outward  PELVIC ALIGNMENT:ASIS are equal   LOWER EXTREMITY WUJ:[id number] hip ROM is full   LOWER EXTREMITY MMT: bilateral hip strength is 4-/5  PALPATION:   General  decreased movement of the lower abdominal scar; fascial tightness in the abdomen.                 External Perineal Exam tenderness located in the clitoris, along the ischiocavernosus, and perineal body                             Internal Pelvic Floor tenderness located in the levator ani, obturator internist, sides of the bladder, urethra, along the anterior wall of the vaginal canal  Patient confirms identification and approves PT to assess internal pelvic floor and treatment Yes  PELVIC MMT:   MMT eval 12/26/23  Vaginal 2/5 4/5  Internal Anal Sphincter 3/5   External Anal Sphincter 3/5   Puborectalis 3/5   (Blank rows = not tested)        TONE: Increased tone on the left pelvic floor  PROLAPSE: none  TODAY'S TREATMENT:  12/26/23 Manual: Scar tissue mobilization: Using the suction cup to pull up the scar in different directions Pulling on the scar in different ways manually to increase further stretch of the scar Myofascial release: Fascial release of the mesenteric root, around the umbilicus and lateral abdomen to work on the fascial restrictions Internal pelvic floor techniques: No emotional/communication barriers or cognitive limitation. Patient is motivated to learn. Patient understands and agrees with treatment goals and plan. PT explains patient will be examined in standing, sitting, and lying down to see how their muscles and joints work. When they are ready, they will be asked to remove their underwear so PT can examine their perineum. The patient is also given the option of providing their own chaperone as one is not  provided in our facility. The patient also has the right and is explained the right to defer or refuse any part of the evaluation or treatment including the internal exam. With the patient's consent, PT will use one gloved finger to gently assess the muscles of the pelvic floor, seeing how well it contracts and relaxes and if there is muscle symmetry. After, the patient will get dressed and PT and patient will discuss exam findings and plan of care. PT and patient discuss plan of care, schedule, attendance policy and HEP activities.  Going through the vaginal canal working on the levator ani, obturator internist and ischiocavernosus.  Self-care: Reviewed on how to use the vaginal wand and therapist used her finger internally to show patient on the correct stroke and where to place the wand on the pelvic floor muscles.  Reviewed with patient on using the estrogen cream and using her finger to apply the cream so it would be more comfortable    12/22/23 Manual: Scar tissue mobilization: Manual work to the lower abdominal scar to go through the restrictions and elongate the tissue Myofascial release: Fascial release in supine to the lower abdomin to go through the restrictions and improve tissue mobility and improve intestinal mobility Quadruped mobilization around the bladder and lower abdomen with different trunk movements Self-care: Education on using the vaginal wand, using it in the bathtub and tips of holding the wand and placing it into the vaginal canal Discussed with patient on the importance of the vaginal moisturizer to assist with reduction of UTI's and reviewed the placement of the moisturizer.     12/11/23 Manual: Scar tissue mobilization: Scar release going through the restrictions by pulling the scar up and moving in different directions Myofascial release:  Fascial release along the suprapubic area and along the area of the bladder Trigger Point Dry Needling Subsequent Treatment:  Instructions provided previously at initial dry needling treatment.  Instructions reviewed, if requested by the patient, prior to subsequent dry needling treatment.  Patient Verbal Consent Given: Yes Education Handout Provided: Previously Provided Muscles Treated: C-section scar; insertion of the TA muscle along the pubic bone Electrical Stimulation Performed: No Treatment Response/Outcome: elongation of the scar tissue Neuromuscular re-education: Core retraining: Standing contraction of the abdominals after the manual work Self-care: Educated patient on using the vaginal moisturizers along the vulva area due to the redness and irritation, educated on not using soap on the vulva area and just letting water wash over the area        PATIENT EDUCATION:  11/24/23 Education details: educated patient on vaginal wand to purchase to work on the pelvic floor trigger points and where to purchase it.  Person educated: Patient Education method: Explanation, Demonstration, Tactile cues, Verbal cues, and Handouts Education comprehension: verbalized understanding, returned demonstration, verbal cues required, tactile cues required, and needs further education  HOME EXERCISE PROGRAM: See above.   ASSESSMENT:  CLINICAL IMPRESSION: Patient is a 53 y.o. female who was seen today for physical therapy  treatment for dyspareunia, constipation, urinary incontinence, and history of pelvic surgery. Patient has not leaked urine with coughing and sneezing since last visit.  No stool leakage since last visit. Patient has increased mobility of the scar and lower abdominal region. She is having a bowel movement daily. Pelvic floor strength increased to 4/5. Patient will benefit from skilled therapy to improve pelvic floor coordination and strength while reducing her pain.    OBJECTIVE IMPAIRMENTS: decreased activity tolerance, decreased coordination, decreased strength, increased fascial restrictions, increased  muscle spasms, and pain.   ACTIVITY LIMITATIONS: lifting, bending, sitting, standing, squatting, continence, toileting, and locomotion level  PARTICIPATION LIMITATIONS: meal prep, cleaning, laundry, interpersonal relationship, driving, shopping, and community activity  PERSONAL FACTORS: Fitness and 3+ comorbidities: ADD; Chronic Fatique Syndrome; Fibromyalgia; PTSD; Cholecystectomy; Cesarean sectoin; Total hysterectomy 2014; bladder sling 2017 with mesh; Mesh removed 2018  are also affecting patient's functional outcome.   REHAB POTENTIAL: Good  CLINICAL DECISION MAKING: Evolving/moderate complexity  EVALUATION COMPLEXITY: Moderate   GOALS: Goals reviewed with patient? Yes  SHORT TERM GOALS: Target date: 12/22/23  Patient educated on using a vaginal wand on the pelvic floor muscles to reduce the trigger points.  Baseline: Goal status: Met 12/04/23  2.  Patient able to perform diaphragmatic breathing to elongate the pelvic floor muscles.  Baseline:  Goal status: Met 12/26/23  3.  Patient educated on initial HEP for flexibility exercises.  Baseline:  Goal status: Met 12/22/23  4.  Patient educated on scar massage to reduce restrictions of the lower abdomen.  Baseline:  Goal status: Met 12/04/23  LONG TERM GOALS: Target date: 02/16/24  Patient is independent with advanced HEP for core and pelvic floor.  Baseline:  Goal status: INITIAL  2.  Patient is able to have penile penetration vaginally due to the reduction of trigger points of the pelvic floor.  Baseline:  Goal status: INITIAL  3.  Patient reports her urinary leakage decreased >/= 75% due to increased strength of the pelvic floor >/= 3/5 with circular hug of therapist finger.  Baseline:  Goal status: INITIAL  4.  Patient reports her stool leakage has decreased >/= 75% due to reduction of trigger points in the pelvic floor.  Baseline:  Goal status: INITIAL  5.  Patient has improved mobility of the lower abdominal scar  so the twisted knot feeling is reduced >/= 50%.  Baseline:  Goal status: Met 12/26/23    PLAN:  PT FREQUENCY: 1-2x/week  PT DURATION: 6 months  PLANNED INTERVENTIONS: 97110-Therapeutic exercises, 97530- Therapeutic activity, 97112- Neuromuscular re-education, 97535- Self Care, 95621- Manual therapy, [phone]- Aquatic Therapy, [id number]- Electrical stimulation (unattended), 97035- Ultrasound, Patient/Family education, Dry Needling, Joint mobilization, Spinal mobilization, Scar mobilization, Cryotherapy, Moist heat, and Biofeedback  PLAN FOR NEXT SESSION:If patient is doing well then discharge  Eulis Foster, PT 12/26/23 4:48 PM

## 2024-01-01 DIAGNOSIS — M25512 Pain in left shoulder: Secondary | ICD-10-CM | POA: Diagnosis not present

## 2024-01-01 DIAGNOSIS — M79602 Pain in left arm: Secondary | ICD-10-CM | POA: Diagnosis not present

## 2024-01-01 DIAGNOSIS — M6281 Muscle weakness (generalized): Secondary | ICD-10-CM | POA: Diagnosis not present

## 2024-01-08 ENCOUNTER — Encounter (HOSPITAL_COMMUNITY): Payer: Self-pay | Admitting: Obstetrics

## 2024-01-08 DIAGNOSIS — M25512 Pain in left shoulder: Secondary | ICD-10-CM | POA: Diagnosis not present

## 2024-01-08 DIAGNOSIS — M6281 Muscle weakness (generalized): Secondary | ICD-10-CM | POA: Diagnosis not present

## 2024-01-08 DIAGNOSIS — M79602 Pain in left arm: Secondary | ICD-10-CM | POA: Diagnosis not present

## 2024-01-08 NOTE — Progress Notes (Signed)
 Spoke w/ via phone for pre-op interview--- [name] needs dos----  NONE per anesthesia, surgeon orders requested 3/ 19/25.      Scott results------ COVID test -----patient states asymptomatic no test needed Arrive at -------1400 NPO after MN NO Solid Food.  Clear liquids from MN until---1300 Pre-Surgery Ensure or G2:  Med rec completed Medications to take morning of surgery -----Xanax-PRN Diabetic medication -----  GLP1 agonist last dose: GLP1 instructions:  Patient instructed no nail polish to be worn day of surgery Patient instructed to bring photo id and insurance card day of surgery Patient aware to have Driver (ride ) / caregiver    for 24 hours after surgery - Husband [name] Patient Special Instructions ----- Shower with antibacterial soap. Pre-Op special Instructions -----  Patient verbalized understanding of instructions that were given at this phone interview. Patient denies chest pain, sob, fever, cough at the interview.

## 2024-01-11 NOTE — H&P (View-Only) (Signed)
 [facility] Pre-Operative H&P  Subjective Chief Complaint: [name] presents for a preoperative encounter.   History of Present Illness: [name] is a 53 y.o. female who presents for preoperative visit.  She is scheduled to undergo cystoscopy, bladder biopsy on 01/15/24.  Her symptoms include mixed urinary incontinence, history of recurrent UTI, dyspareunia, nocturia, vaginal atrophy, and constipation. She was was found to have a bladder lesion superior to the right ureteral orifice during cystoscopy on 12/14/23.   Midurethral sling placed in 08/11/17 with mesh excision 01/09/18 by Dr. Sherron Monday  - s/p urethral bulking and botox injections 12/14/23  - denies leakage with sneezing and resolution of leakage, discontinued panty liners  - continues Gemtesa - reports some return of tingling sensation when she has urge to void.  Urodynamics showed: - UDS 10/25/23 showed + CST, + terminal DO, normal pressure flow and PVR with reduced MCC  1. Sensation was increased; capacity was reduced 2. Stress Incontinence was demonstrated at normal pressures; 3. 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  4. Emptying was dysfunctional with a normal PVR, a sustained detrusor contraction present,  abdominal straining not present, dyssynergic urethral sphincter activity on EMG.  Past Medical History:  Diagnosis Date   ADD (attention deficit disorder)    Anxiety    Arthropathy, unspecified, site unspecified    Chronic fatigue syndrome    Depression    Disc degeneration    Fibromyalgia    History of kidney stones    last stone in 2010   Migraines    Multilevel degenerative disc disease    cervical and lumbar, has had injections/epidural   Panic attack 2019   PTSD (post-traumatic stress disorder) 2019   Sleep disorder 01/08/2014   Sleep disturbance, unspecified    Symptomatic mammary  hypertrophy 09/2018   Unspecified nonpsychotic mental disorder      Past Surgical History:  Procedure Laterality Date   BILATERAL SALPINGECTOMY Bilateral 10/16/2013   Procedure: BILATERAL SALPINGECTOMY;  Surgeon: Lenoard Aden, MD;  Location: [facility];  Service: Gynecology;  Laterality: Bilateral;   BREAST REDUCTION SURGERY Bilateral 10/03/2018   Procedure: BILATERAL BREAST REDUCTION;  Surgeon: Peggye Form, DO;  Location: [facility];  Service: Plastics;  Laterality: Bilateral;   CESAREAN SECTION     x2   CHOLECYSTECTOMY     DIAGNOSTIC LAPAROSCOPY     ENDOVENOUS ABLATION SAPHENOUS VEIN W/ LASER Right 10/26/2023   endovenous laser ablation right greater saphenous vein and stab phlebectomy10-20 incisions right leg by Lemar Livings MD   MANDIBLE SURGERY     REMOVAL OF URINARY SLING N/A 01/09/2018   Procedure: REMOVAL OF MESH URINARY SLING EXTRUSION  CYSTOSTOPY;  Surgeon: Alfredo Martinez, MD;  Location: [facility];  Service: Urology;  Laterality: N/A;   ROBOTIC ASSISTED TOTAL HYSTERECTOMY N/A 10/16/2013   Procedure: ROBOTIC ASSISTED TOTAL HYSTERECTOMY;  Surgeon: Lenoard Aden, MD;  Location: [facility];  Service: Gynecology;  Laterality: N/A;   ROTATOR CUFF REPAIR Left 08/21/2023    is allergic to mirabegron, sulfa antibiotics, solifenacin, elavil [amitriptyline hcl], iodinated contrast media, and lyrica [pregabalin].   Family History  Problem Relation Age of Onset   Heart attack Mother    Hypertension Mother    Depression Mother    Neuropathy Father    Diabetes Father    Stroke Maternal Grandmother    Alcohol abuse Maternal Grandfather    Cancer Paternal Grandfather  Breast cancer Neg Hx    Uterine cancer Neg Hx    Bladder Cancer Neg Hx     Social History   Tobacco Use   Smoking status: Never   Smokeless tobacco: Never  Vaping Use   Vaping status: Never Used  Substance Use Topics   Alcohol use: Yes    Comment: socially, once per month   Drug use:  Yes    Types: Marijuana    Comment: prn when pain gets bad     Review of Systems was negative for a full 10 system review except as noted in the History of Present Illness.   Current Outpatient Medications:    ALPRAZolam (XANAX) 1 MG tablet, Take 1 mg by mouth daily as needed for anxiety., Disp: , Rfl:    amphetamine-dextroamphetamine (ADDERALL) 10 MG tablet, Take 10 mg by mouth 2 (two) times daily., Disp: , Rfl:    estradiol (ESTRACE) 0.1 MG/GM vaginal cream, Place 0.5 g vaginally 2 (two) times a week. Place 0.5g nightly for two weeks then twice a week after, Disp: 30 g, Rfl: 11   traZODone (DESYREL) 100 MG tablet, Take 300 mg by mouth at bedtime., Disp: , Rfl:    Objective Vitals:   01/12/24 1451  BP: 108/77  Pulse: 73    Gen: NAD CV: S1 S2 RRR  Calibration of bilateral percutaneous tibial nerve stimulation  Assessment/ Plan  Assessment: The patient is a 53 y.o. year old scheduled to undergo cystoscopy, bladder biopsy, fulguration of bladder lesion. Verbal consent was obtained for these procedures.  Lesion of bladder  OAB (overactive bladder)  Urinary incontinence, mixed Assessment & Plan: - setup Vivally with team for transcutaneous tibial nerve stimulation - s/p urethral bulking and botox injections 12/14/23 with resolution of leakage - denies leakage, negative CST, PVR 30mL. No bleeding or purulent discharge noted, normal contour of urethral with no blood or purulent discharge expressed with palpation. No enlarged Skene's glands. Reassured pt and advised to return for evaluation if clinical change - advised against starting CIC unless if she experiences sensation of incomplete emptying, pain, or lack of ability to void > 3 hrs.   - UDS prior to midurethral sling in 2018 with bladder capacity, cough induced detrusor overactivity incontinence, pelvic floor dyssynergia and history of dyspareunia with endometriosis s/p pelvic floor PT - UDS 10/25/23 showed + CST, + terminal  DO, normal pressure flow and PVR with reduced MCC - continues Gemtesa due to relief, can discontinue after 2-3 weeks of botox injection - avoid anticholinergics due to history of lower extremity edema from Detrol Constant, oxybutynin, Vesicare and chronic constipation - consider bladder pain syndrome due to IBS, migraine and recurrent lower urinary tract symptoms  - We discussed the symptoms of overactive bladder (OAB), which include urinary urgency, urinary frequency, nocturia, with or without urge incontinence.  While we do not know the exact etiology of OAB, several treatment options exist. We discussed management including behavioral therapy (decreasing bladder irritants, urge suppression strategies, timed voids, bladder retraining), physical therapy, medication; for refractory cases posterior tibial nerve stimulation, sacral neuromodulation, and intravesical botulinum toxin injection.  -Encouraged timed voids and bladder training to an increase daytime urinary frequency - pending pelvic floor PT due to pelvic floor myofascial pain on exam - desires to avoid MUS due to history of mid urethral sling and excision - no change in urinary symptoms after PTNS x 4, however improved bowel symptoms  - For refractory OAB we reviewed the procedure for intravesical Botox  injection with cystoscopy in the office and reviewed the risks, benefits and alternatives of treatment including but not limited to infection, need for self-catheterization and need for repeat therapy.  We discussed that there is a 5-15% chance of needing to catheterize with Botox and that this usually resolves in a few months; however can persist for longer periods of time.  Typically Botox injections would need to be repeated every 3-12 months since this is not a permanent therapy. Desires to proceed  We discussed the role of sacral neuromodulation and how it works. It requires a test phase, and documentation of bladder function via diary.  After a successful test period, a permanent wire and generator are placed in the OR. The battery lasts 5 years on average and would need to be replaced surgically.  The goal of this therapy is at least a 50% improvement in symptoms. It is NOT realistic to expect a 100% cure.  We reviewed the fact that about 30% of patients fail the test phase and are not candidates for permanent generator placement.  We discussed the risk of infection and that the patient would not be able to get an MRI once the device is placed. There are two companies that provide this therapy: [employer] and [employer]. [employer]' product is new and is similar to [employer]'s, but has advantages of a smaller and rechargeable battery and being able to have an MRI with the implant. For all procedures, we discussed risks of bleeding, infection, damage to surrounding organs including bowel, bladder, blood vessels, ureters and nerves, need for further surgery, risk of postoperative urinary incontinence or retention with need to catheterize, recurrent prolapse, numbness and weakness at any body site, buttock pain, and the rarer risks of blood clot, heart attack, pneumonia, death.   - pt reports considering SNM if return of OAB symptoms. - positive cough stress test for stress urinary incontinence - For treatment of stress urinary incontinence,  non-surgical options include expectant management, weight loss, physical therapy, as well as a pessary.  Surgical options include a midurethral sling, Burch urethropexy, and transurethral injection of a bulking agent.   Plan: General Surgical Consent: The patient has previously been counseled on alternative treatments, and the decision by the patient and provider was to proceed with the procedure listed above.  For all procedures, there are risks of bleeding, infection, damage to surrounding organs including but not limited to bowel, bladder, blood vessels, ureters and nerves, and need for further surgery if  an injury were to occur. These risks are all low with minimally invasive surgery.   There are risks of numbness and weakness at any body site or buttock/rectal pain.  It is possible that baseline pain can be worsened by surgery, either with or without mesh. If surgery is vaginal, there is also a low risk of possible conversion to laparoscopy or open abdominal incision where indicated. Very rare risks include blood transfusion, blood clot, heart attack, pneumonia, or death.   There is also a risk of short-term postoperative urinary retention with need to use a catheter. About half of patients need to go home from surgery with a catheter, which is then later removed in the office. The risk of long-term need for a catheter is very low. There is also a risk of worsening of overactive bladder.   We discussed consent for blood products. Risks for blood transfusion include allergic reactions, other reactions that can affect different body organs and managed accordingly, transmission of infectious diseases such as HIV  or Hepatitis. However, the blood is screened. Patient consents for blood products.  Pre-operative instructions:  She was instructed to not take Aspirin/NSAIDs x 7days prior to surgery. She may continue her 81mg  ASA. Antibiotic prophylaxis was ordered as indicated.  Catheter use: Patient will go home with foley if needed after post-operative voiding trial.  Post-operative instructions:  She was provided with specific post-operative instructions, including precautions and signs/symptoms for which we would recommend contacting us, in addition to daytime and after-hours contact phone numbers. This was provided on a handout.   Post-operative medications: Prescriptions for motrin, tylenol, miralax, and pyridium were sent to her pharmacy. Discussed using ibuprofen and tylenol on a schedule to limit use of narcotics.   Laboratory testing:  We will check labs: per anesthesia. Day of surgery UPT  n/a  Preoperative clearance:  She does not require surgical clearance.    Post-operative follow-up:  A post-operative appointment will be made for 6 weeks from the date of surgery. If she needs a post-operative nurse visit for a voiding trial, that will be set up after she leaves the hospital.    Patient will call the clinic or use MyChart should anything change or any new issues arise.  Time spent: I spent 47 minutes dedicated to the care of this patient for calibration and setup for percutaneous stimulation on the date of this encounter to include pre-visit review of records, face-to-face time with the patient discussing mixed urinary incontinence, bladder lesion and post visit documentation.    Loleta Chance, MD

## 2024-01-11 NOTE — Progress Notes (Unsigned)
 [facility] Pre-Operative H&P  Subjective Chief Complaint: [name] presents for a preoperative encounter.   History of Present Illness: [name] is a 53 y.o. female who presents for preoperative visit.  She is scheduled to undergo cystoscopy, bladder biopsy on 01/15/24.  Her symptoms include mixed urinary incontinence, history of recurrent UTI, dyspareunia, nocturia, vaginal atrophy, and constipation. She was was found to have a bladder lesion superior to the right ureteral orifice during cystoscopy on 12/14/23.   Midurethral sling placed in 08/11/17 with mesh excision 01/09/18 by Dr. Sherron Monday  - s/p urethral bulking and botox injections 12/14/23  - denies leakage with sneezing and resolution of leakage, discontinued panty liners  - continues Gemtesa - reports some return of tingling sensation when she has urge to void.  Urodynamics showed: - UDS 10/25/23 showed + CST, + terminal DO, normal pressure flow and PVR with reduced MCC  1. Sensation was increased; capacity was reduced 2. Stress Incontinence was demonstrated at normal pressures; 3. 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  4. Emptying was dysfunctional with a normal PVR, a sustained detrusor contraction present,  abdominal straining not present, dyssynergic urethral sphincter activity on EMG.  Past Medical History:  Diagnosis Date   ADD (attention deficit disorder)    Anxiety    Arthropathy, unspecified, site unspecified    Chronic fatigue syndrome    Depression    Disc degeneration    Fibromyalgia    History of kidney stones    last stone in 2010   Migraines    Multilevel degenerative disc disease    cervical and lumbar, has had injections/epidural   Panic attack 2019   PTSD (post-traumatic stress disorder) 2019   Sleep disorder 01/08/2014   Sleep disturbance, unspecified    Symptomatic mammary  hypertrophy 09/2018   Unspecified nonpsychotic mental disorder      Past Surgical History:  Procedure Laterality Date   BILATERAL SALPINGECTOMY Bilateral 10/16/2013   Procedure: BILATERAL SALPINGECTOMY;  Surgeon: Lenoard Aden, MD;  Location: [facility];  Service: Gynecology;  Laterality: Bilateral;   BREAST REDUCTION SURGERY Bilateral 10/03/2018   Procedure: BILATERAL BREAST REDUCTION;  Surgeon: Peggye Form, DO;  Location: [facility];  Service: Plastics;  Laterality: Bilateral;   CESAREAN SECTION     x2   CHOLECYSTECTOMY     DIAGNOSTIC LAPAROSCOPY     ENDOVENOUS ABLATION SAPHENOUS VEIN W/ LASER Right 10/26/2023   endovenous laser ablation right greater saphenous vein and stab phlebectomy10-20 incisions right leg by Lemar Livings MD   MANDIBLE SURGERY     REMOVAL OF URINARY SLING N/A 01/09/2018   Procedure: REMOVAL OF MESH URINARY SLING EXTRUSION  CYSTOSTOPY;  Surgeon: Alfredo Martinez, MD;  Location: [facility];  Service: Urology;  Laterality: N/A;   ROBOTIC ASSISTED TOTAL HYSTERECTOMY N/A 10/16/2013   Procedure: ROBOTIC ASSISTED TOTAL HYSTERECTOMY;  Surgeon: Lenoard Aden, MD;  Location: [facility];  Service: Gynecology;  Laterality: N/A;   ROTATOR CUFF REPAIR Left 08/21/2023    is allergic to mirabegron, sulfa antibiotics, solifenacin, elavil [amitriptyline hcl], iodinated contrast media, and lyrica [pregabalin].   Family History  Problem Relation Age of Onset   Heart attack Mother    Hypertension Mother    Depression Mother    Neuropathy Father    Diabetes Father    Stroke Maternal Grandmother    Alcohol abuse Maternal Grandfather    Cancer Paternal Grandfather  Breast cancer Neg Hx    Uterine cancer Neg Hx    Bladder Cancer Neg Hx     Social History   Tobacco Use   Smoking status: Never   Smokeless tobacco: Never  Vaping Use   Vaping status: Never Used  Substance Use Topics   Alcohol use: Yes    Comment: socially, once per month   Drug use:  Yes    Types: Marijuana    Comment: prn when pain gets bad     Review of Systems was negative for a full 10 system review except as noted in the History of Present Illness.   Current Outpatient Medications:    ALPRAZolam (XANAX) 1 MG tablet, Take 1 mg by mouth daily as needed for anxiety., Disp: , Rfl:    amphetamine-dextroamphetamine (ADDERALL) 10 MG tablet, Take 10 mg by mouth 2 (two) times daily., Disp: , Rfl:    estradiol (ESTRACE) 0.1 MG/GM vaginal cream, Place 0.5 g vaginally 2 (two) times a week. Place 0.5g nightly for two weeks then twice a week after, Disp: 30 g, Rfl: 11   traZODone (DESYREL) 100 MG tablet, Take 300 mg by mouth at bedtime., Disp: , Rfl:    Objective Vitals:   01/12/24 1451  BP: 108/77  Pulse: 73    Gen: NAD CV: S1 S2 RRR  Calibration of bilateral percutaneous tibial nerve stimulation  Assessment/ Plan  Assessment: The patient is a 53 y.o. year old scheduled to undergo cystoscopy, bladder biopsy, fulguration of bladder lesion. Verbal consent was obtained for these procedures.  Lesion of bladder  OAB (overactive bladder)  Urinary incontinence, mixed Assessment & Plan: - setup Vivally with team for transcutaneous tibial nerve stimulation - s/p urethral bulking and botox injections 12/14/23 with resolution of leakage - denies leakage, negative CST, PVR 30mL. No bleeding or purulent discharge noted, normal contour of urethral with no blood or purulent discharge expressed with palpation. No enlarged Skene's glands. Reassured pt and advised to return for evaluation if clinical change - advised against starting CIC unless if she experiences sensation of incomplete emptying, pain, or lack of ability to void > 3 hrs.   - UDS prior to midurethral sling in 2018 with bladder capacity, cough induced detrusor overactivity incontinence, pelvic floor dyssynergia and history of dyspareunia with endometriosis s/p pelvic floor PT - UDS 10/25/23 showed + CST, + terminal  DO, normal pressure flow and PVR with reduced MCC - continues Gemtesa due to relief, can discontinue after 2-3 weeks of botox injection - avoid anticholinergics due to history of lower extremity edema from Detrol Constant, oxybutynin, Vesicare and chronic constipation - consider bladder pain syndrome due to IBS, migraine and recurrent lower urinary tract symptoms  - We discussed the symptoms of overactive bladder (OAB), which include urinary urgency, urinary frequency, nocturia, with or without urge incontinence.  While we do not know the exact etiology of OAB, several treatment options exist. We discussed management including behavioral therapy (decreasing bladder irritants, urge suppression strategies, timed voids, bladder retraining), physical therapy, medication; for refractory cases posterior tibial nerve stimulation, sacral neuromodulation, and intravesical botulinum toxin injection.  -Encouraged timed voids and bladder training to an increase daytime urinary frequency - pending pelvic floor PT due to pelvic floor myofascial pain on exam - desires to avoid MUS due to history of mid urethral sling and excision - no change in urinary symptoms after PTNS x 4, however improved bowel symptoms  - For refractory OAB we reviewed the procedure for intravesical Botox  injection with cystoscopy in the office and reviewed the risks, benefits and alternatives of treatment including but not limited to infection, need for self-catheterization and need for repeat therapy.  We discussed that there is a 5-15% chance of needing to catheterize with Botox and that this usually resolves in a few months; however can persist for longer periods of time.  Typically Botox injections would need to be repeated every 3-12 months since this is not a permanent therapy. Desires to proceed  We discussed the role of sacral neuromodulation and how it works. It requires a test phase, and documentation of bladder function via diary.  After a successful test period, a permanent wire and generator are placed in the OR. The battery lasts 5 years on average and would need to be replaced surgically.  The goal of this therapy is at least a 50% improvement in symptoms. It is NOT realistic to expect a 100% cure.  We reviewed the fact that about 30% of patients fail the test phase and are not candidates for permanent generator placement.  We discussed the risk of infection and that the patient would not be able to get an MRI once the device is placed. There are two companies that provide this therapy: [employer]tr[employer] and [employer]. [employer]' product is new and is similar to Medtr[employer]'s, but has advantages of a smaller and rechargeable battery and being able to have an MRI with the implant. For all procedures, we discussed risks of bleeding, infection, damage to surrounding organs including bowel, bladder, blood vessels, ureters and nerves, need for further surgery, risk of postoperative urinary incontinence or retention with need to catheterize, recurrent prolapse, numbness and weakness at any body site, buttock pain, and the rarer risks of blood clot, heart attack, pneumonia, death.   - pt reports considering SNM if return of OAB symptoms. - positive cough stress test for stress urinary incontinence - For treatment of stress urinary incontinence,  non-surgical options include expectant management, weight loss, physical therapy, as well as a pessary.  Surgical options include a midurethral sling, Burch urethropexy, and transurethral injection of a bulking agent.   Plan: General Surgical Consent: The patient has previously been counseled on alternative treatments, and the decision by the patient and provider was to proceed with the procedure listed above.  For all procedures, there are risks of bleeding, infection, damage to surrounding organs including but not limited to bowel, bladder, blood vessels, ureters and nerves, and need for further surgery if  an injury were to occur. These risks are all low with minimally invasive surgery.   There are risks of numbness and weakness at any body site or buttock/rectal pain.  It is possible that baseline pain can be worsened by surgery, either with or without mesh. If surgery is vaginal, there is also a low risk of possible conversion to laparoscopy or open abdominal incision where indicated. Very rare risks include blood transfusion, blood clot, heart attack, pneumonia, or death.   There is also a risk of short-term postoperative urinary retention with need to use a catheter. About half of patients need to go home from surgery with a catheter, which is then later removed in the office. The risk of long-term need for a catheter is very low. There is also a risk of worsening of overactive bladder.   We discussed consent for blood products. Risks for blood transfusion include allergic reactions, other reactions that can affect different body organs and managed accordingly, transmission of infectious diseases such as HIV  or Hepatitis. However, the blood is screened. Patient consents for blood products.  Pre-operative instructions:  She was instructed to not take Aspirin/NSAIDs x 7days prior to surgery. She may continue her 81mg  ASA. Antibiotic prophylaxis was ordered as indicated.  Catheter use: Patient will go home with foley if needed after post-operative voiding trial.  Post-operative instructions:  She was provided with specific post-operative instructions, including precautions and signs/symptoms for which we would recommend contacting us, in addition to daytime and after-hours contact phone numbers. This was provided on a handout.   Post-operative medications: Prescriptions for motrin, tylenol, miralax, and pyridium were sent to her pharmacy. Discussed using ibuprofen and tylenol on a schedule to limit use of narcotics.   Laboratory testing:  We will check labs: per anesthesia. Day of surgery UPT  n/a  Preoperative clearance:  She does not require surgical clearance.    Post-operative follow-up:  A post-operative appointment will be made for 6 weeks from the date of surgery. If she needs a post-operative nurse visit for a voiding trial, that will be set up after she leaves the hospital.    Patient will call the clinic or use MyChart should anything change or any new issues arise.  Time spent: I spent 47 minutes dedicated to the care of this patient for calibration and setup for percutaneous stimulation on the date of this encounter to include pre-visit review of records, face-to-face time with the patient discussing mixed urinary incontinence, bladder lesion and post visit documentation.    Loleta Chance, MD

## 2024-01-12 ENCOUNTER — Ambulatory Visit (INDEPENDENT_AMBULATORY_CARE_PROVIDER_SITE_OTHER): Payer: Medicare Other | Admitting: Obstetrics

## 2024-01-12 ENCOUNTER — Encounter: Payer: Self-pay | Admitting: Obstetrics

## 2024-01-12 VITALS — BP 108/77 | HR 73

## 2024-01-12 DIAGNOSIS — N329 Bladder disorder, unspecified: Secondary | ICD-10-CM | POA: Diagnosis not present

## 2024-01-12 DIAGNOSIS — N3281 Overactive bladder: Secondary | ICD-10-CM

## 2024-01-12 DIAGNOSIS — N3946 Mixed incontinence: Secondary | ICD-10-CM | POA: Diagnosis not present

## 2024-01-12 DIAGNOSIS — Z01818 Encounter for other preprocedural examination: Secondary | ICD-10-CM

## 2024-01-12 NOTE — Assessment & Plan Note (Signed)
 - setup Vivally with team for transcutaneous tibial nerve stimulation - s/p urethral bulking and botox injections 12/14/23 with resolution of leakage - denies leakage, negative CST, PVR 30mL. No bleeding or purulent discharge noted, normal contour of urethral with no blood or purulent discharge expressed with palpation. No enlarged Skene's glands. Reassured pt and advised to return for evaluation if clinical change - advised against starting CIC unless if she experiences sensation of incomplete emptying, pain, or lack of ability to void > 3 hrs.   - UDS prior to midurethral sling in 2018 with bladder capacity, cough induced detrusor overactivity incontinence, pelvic floor dyssynergia and history of dyspareunia with endometriosis s/p pelvic floor PT - UDS 10/25/23 showed + CST, + terminal DO, normal pressure flow and PVR with reduced MCC - continues Gemtesa due to relief, can discontinue after 2-3 weeks of botox injection - avoid anticholinergics due to history of lower extremity edema from Detrol Constant, oxybutynin, Vesicare and chronic constipation - consider bladder pain syndrome due to IBS, migraine and recurrent lower urinary tract symptoms  - We discussed the symptoms of overactive bladder (OAB), which include urinary urgency, urinary frequency, nocturia, with or without urge incontinence.  While we do not know the exact etiology of OAB, several treatment options exist. We discussed management including behavioral therapy (decreasing bladder irritants, urge suppression strategies, timed voids, bladder retraining), physical therapy, medication; for refractory cases posterior tibial nerve stimulation, sacral neuromodulation, and intravesical botulinum toxin injection.  -Encouraged timed voids and bladder training to an increase daytime urinary frequency - pending pelvic floor PT due to pelvic floor myofascial pain on exam - desires to avoid MUS due to history of mid urethral sling and excision - no  change in urinary symptoms after PTNS x 4, however improved bowel symptoms  - For refractory OAB we reviewed the procedure for intravesical Botox injection with cystoscopy in the office and reviewed the risks, benefits and alternatives of treatment including but not limited to infection, need for self-catheterization and need for repeat therapy.  We discussed that there is a 5-15% chance of needing to catheterize with Botox and that this usually resolves in a few months; however can persist for longer periods of time.  Typically Botox injections would need to be repeated every 3-12 months since this is not a permanent therapy. Desires to proceed  We discussed the role of sacral neuromodulation and how it works. It requires a test phase, and documentation of bladder function via diary. After a successful test period, a permanent wire and generator are placed in the OR. The battery lasts 5 years on average and would need to be replaced surgically.  The goal of this therapy is at least a 50% improvement in symptoms. It is NOT realistic to expect a 100% cure.  We reviewed the fact that about 30% of patients fail the test phase and are not candidates for permanent generator placement.  We discussed the risk of infection and that the patient would not be able to get an MRI once the device is placed. There are two companies that provide this therapy: [employer] and [employer]. [employer]' product is new and is similar to [employer]'s, but has advantages of a smaller and rechargeable battery and being able to have an MRI with the implant. For all procedures, we discussed risks of bleeding, infection, damage to surrounding organs including bowel, bladder, blood vessels, ureters and nerves, need for further surgery, risk of postoperative urinary incontinence or retention with need to catheterize, recurrent prolapse,  numbness and weakness at any body site, buttock pain, and the rarer risks of blood clot, heart attack, pneumonia,  death.   - pt reports considering SNM if return of OAB symptoms. - positive cough stress test for stress urinary incontinence - For treatment of stress urinary incontinence,  non-surgical options include expectant management, weight loss, physical therapy, as well as a pessary.  Surgical options include a midurethral sling, Burch urethropexy, and transurethral injection of a bulking agent.

## 2024-01-15 ENCOUNTER — Ambulatory Visit (HOSPITAL_BASED_OUTPATIENT_CLINIC_OR_DEPARTMENT_OTHER): Admitting: Anesthesiology

## 2024-01-15 ENCOUNTER — Ambulatory Visit (HOSPITAL_COMMUNITY)
Admission: RE | Admit: 2024-01-15 | Discharge: 2024-01-15 | Disposition: A | Discharge status: Home / Self Care | Attending: Obstetrics | Admitting: Obstetrics

## 2024-01-15 ENCOUNTER — Ambulatory Visit (HOSPITAL_COMMUNITY): Admitting: Anesthesiology

## 2024-01-15 ENCOUNTER — Other Ambulatory Visit: Payer: Self-pay

## 2024-01-15 ENCOUNTER — Encounter (HOSPITAL_COMMUNITY)
Admission: RE | Disposition: A | Discharge status: Home / Self Care | Payer: Self-pay | Source: Home / Self Care | Attending: Obstetrics

## 2024-01-15 ENCOUNTER — Encounter (HOSPITAL_COMMUNITY): Payer: Self-pay | Admitting: Obstetrics

## 2024-01-15 DIAGNOSIS — N941 Unspecified dyspareunia: Secondary | ICD-10-CM | POA: Insufficient documentation

## 2024-01-15 DIAGNOSIS — R519 Headache, unspecified: Secondary | ICD-10-CM | POA: Diagnosis not present

## 2024-01-15 DIAGNOSIS — R351 Nocturia: Secondary | ICD-10-CM | POA: Diagnosis not present

## 2024-01-15 DIAGNOSIS — F32A Depression, unspecified: Secondary | ICD-10-CM | POA: Diagnosis not present

## 2024-01-15 DIAGNOSIS — N329 Bladder disorder, unspecified: Secondary | ICD-10-CM | POA: Diagnosis not present

## 2024-01-15 DIAGNOSIS — N3289 Other specified disorders of bladder: Secondary | ICD-10-CM | POA: Diagnosis not present

## 2024-01-15 DIAGNOSIS — N3946 Mixed incontinence: Secondary | ICD-10-CM | POA: Diagnosis not present

## 2024-01-15 DIAGNOSIS — N952 Postmenopausal atrophic vaginitis: Secondary | ICD-10-CM | POA: Diagnosis not present

## 2024-01-15 DIAGNOSIS — F419 Anxiety disorder, unspecified: Secondary | ICD-10-CM | POA: Diagnosis not present

## 2024-01-15 DIAGNOSIS — K5900 Constipation, unspecified: Secondary | ICD-10-CM | POA: Diagnosis not present

## 2024-01-15 DIAGNOSIS — Z79899 Other long term (current) drug therapy: Secondary | ICD-10-CM | POA: Diagnosis not present

## 2024-01-15 DIAGNOSIS — Z87440 Personal history of urinary (tract) infections: Secondary | ICD-10-CM | POA: Insufficient documentation

## 2024-01-15 DIAGNOSIS — Z87442 Personal history of urinary calculi: Secondary | ICD-10-CM | POA: Diagnosis not present

## 2024-01-15 DIAGNOSIS — M797 Fibromyalgia: Secondary | ICD-10-CM | POA: Insufficient documentation

## 2024-01-15 HISTORY — PX: CYSTOSCOPY: SHX5120

## 2024-01-15 LAB — NO BLOOD PRODUCTS

## 2024-01-15 SURGERY — CYSTOSCOPY
Anesthesia: General

## 2024-01-15 MED ORDER — MIDAZOLAM HCL 2 MG/2ML IJ SOLN
INTRAMUSCULAR | Status: DC | PRN
  Administered 2024-01-15: 2 mg via INTRAVENOUS

## 2024-01-15 MED ORDER — GABAPENTIN 300 MG PO CAPS: 300.0000 mg | ORAL_CAPSULE | ORAL | Status: AC

## 2024-01-15 MED ORDER — CELECOXIB 200 MG PO CAPS: 400.0000 mg | ORAL_CAPSULE | ORAL | Status: AC

## 2024-01-15 MED ORDER — CHLORHEXIDINE GLUCONATE 0.12 % MT SOLN: 15.0000 mL | Freq: Once | OROMUCOSAL | Status: AC

## 2024-01-15 MED ORDER — EPHEDRINE SULFATE-NACL 50-0.9 MG/10ML-% IV SOSY
PREFILLED_SYRINGE | INTRAVENOUS | Status: DC | PRN
  Administered 2024-01-15: 10 mg via INTRAVENOUS

## 2024-01-15 MED ORDER — DEXAMETHASONE SODIUM PHOSPHATE 10 MG/ML IJ SOLN
INTRAMUSCULAR | Status: DC | PRN
  Administered 2024-01-15: 10 mg via INTRAVENOUS

## 2024-01-15 MED ORDER — EPHEDRINE 5 MG/ML INJ
INTRAVENOUS | Status: AC
  Filled 2024-01-15: qty 5

## 2024-01-15 MED ORDER — FENTANYL CITRATE (PF) 250 MCG/5ML IJ SOLN
INTRAMUSCULAR | Status: DC | PRN
  Administered 2024-01-15: 50 ug via INTRAVENOUS

## 2024-01-15 MED ORDER — ACETAMINOPHEN 500 MG PO TABS: 1000.0000 mg | ORAL_TABLET | Freq: Once | ORAL | Status: DC

## 2024-01-15 MED ORDER — GABAPENTIN 100 MG PO CAPS: 100.0000 mg | ORAL_CAPSULE | Freq: Two times a day (BID) | ORAL | Status: DC

## 2024-01-15 MED ORDER — POVIDONE-IODINE 10 % EX SWAB: 2.0000 | Freq: Once | CUTANEOUS | Status: DC

## 2024-01-15 MED ORDER — DEXAMETHASONE SODIUM PHOSPHATE 10 MG/ML IJ SOLN
INTRAMUSCULAR | Status: AC
  Filled 2024-01-15: qty 1

## 2024-01-15 MED ORDER — ACETAMINOPHEN 500 MG PO TABS: 1000.0000 mg | ORAL_TABLET | Freq: Four times a day (QID) | ORAL | Status: DC

## 2024-01-15 MED ORDER — CHLORHEXIDINE GLUCONATE 0.12 % MT SOLN
OROMUCOSAL | Status: AC
  Administered 2024-01-15: 15 mL via OROMUCOSAL
  Filled 2024-01-15: qty 15

## 2024-01-15 MED ORDER — ONDANSETRON HCL 4 MG/2ML IJ SOLN
INTRAMUSCULAR | Status: AC
  Administered 2024-01-15: 4 mg via INTRAVENOUS
  Filled 2024-01-15: qty 2

## 2024-01-15 MED ORDER — PROPOFOL 10 MG/ML IV BOLUS
INTRAVENOUS | Status: AC
  Filled 2024-01-15: qty 20

## 2024-01-15 MED ORDER — ONDANSETRON HCL 4 MG/2ML IJ SOLN: 4.0000 mg | Freq: Four times a day (QID) | INTRAMUSCULAR | Status: DC | PRN

## 2024-01-15 MED ORDER — ONDANSETRON HCL 4 MG/2ML IJ SOLN: 4.0000 mg | Freq: Once | INTRAMUSCULAR | Status: AC

## 2024-01-15 MED ORDER — GABAPENTIN 300 MG PO CAPS
ORAL_CAPSULE | ORAL | Status: AC
  Administered 2024-01-15: 300 mg via ORAL
  Filled 2024-01-15: qty 1

## 2024-01-15 MED ORDER — MIDAZOLAM HCL 2 MG/2ML IJ SOLN: 0.5000 mg | Freq: Once | INTRAMUSCULAR | Status: DC | PRN

## 2024-01-15 MED ORDER — MEPERIDINE HCL 25 MG/ML IJ SOLN: 6.2500 mg | INTRAMUSCULAR | Status: DC | PRN

## 2024-01-15 MED ORDER — STERILE WATER FOR IRRIGATION IR SOLN
Status: DC | PRN
  Administered 2024-01-15: 3000 mL

## 2024-01-15 MED ORDER — ACETAMINOPHEN 500 MG PO TABS: 1000.0000 mg | ORAL_TABLET | ORAL | Status: AC

## 2024-01-15 MED ORDER — FENTANYL CITRATE (PF) 100 MCG/2ML IJ SOLN: 25.0000 ug | INTRAMUSCULAR | Status: DC | PRN

## 2024-01-15 MED ORDER — ORAL CARE MOUTH RINSE: 15.0000 mL | Freq: Once | OROMUCOSAL | Status: AC

## 2024-01-15 MED ORDER — SCOPOLAMINE 1 MG/3DAYS TD PT72: 1.0000 | MEDICATED_PATCH | TRANSDERMAL | Status: DC

## 2024-01-15 MED ORDER — OXYCODONE HCL 5 MG/5ML PO SOLN: 5.0000 mg | Freq: Once | ORAL | Status: DC | PRN

## 2024-01-15 MED ORDER — FENTANYL CITRATE (PF) 250 MCG/5ML IJ SOLN
INTRAMUSCULAR | Status: AC
  Filled 2024-01-15: qty 5

## 2024-01-15 MED ORDER — CEFAZOLIN SODIUM-DEXTROSE 2-4 GM/100ML-% IV SOLN
INTRAVENOUS | Status: AC
Start: 2024-01-15 — End: 2024-01-15
  Filled 2024-01-15: qty 100

## 2024-01-15 MED ORDER — LIDOCAINE 2% (20 MG/ML) 5 ML SYRINGE
INTRAMUSCULAR | Status: AC
  Filled 2024-01-15: qty 5

## 2024-01-15 MED ORDER — ACETAMINOPHEN 500 MG PO TABS
ORAL_TABLET | ORAL | Status: AC
Start: 2024-01-15 — End: 2024-01-15
  Administered 2024-01-15: 1000 mg via ORAL
  Filled 2024-01-15: qty 2

## 2024-01-15 MED ORDER — HYDROMORPHONE HCL 2 MG PO TABS: 1.0000 mg | ORAL_TABLET | ORAL | Status: DC | PRN

## 2024-01-15 MED ORDER — CEFAZOLIN SODIUM-DEXTROSE 2-4 GM/100ML-% IV SOLN
2.0000 g | INTRAVENOUS | Status: AC
Start: 2024-01-15 — End: 2024-01-15
  Administered 2024-01-15: 2 g via INTRAVENOUS

## 2024-01-15 MED ORDER — OXYCODONE HCL 5 MG PO TABS: 5.0000 mg | ORAL_TABLET | Freq: Once | ORAL | Status: DC | PRN

## 2024-01-15 MED ORDER — MIDAZOLAM HCL 2 MG/2ML IJ SOLN
INTRAMUSCULAR | Status: AC
  Filled 2024-01-15: qty 2

## 2024-01-15 MED ORDER — SODIUM CHLORIDE 0.9 % IR SOLN
Status: DC | PRN
  Administered 2024-01-15: 1000 mL

## 2024-01-15 MED ORDER — CELECOXIB 200 MG PO CAPS
ORAL_CAPSULE | ORAL | Status: AC
  Administered 2024-01-15: 400 mg via ORAL
  Filled 2024-01-15: qty 2

## 2024-01-15 MED ORDER — ONDANSETRON HCL 4 MG/2ML IJ SOLN
INTRAMUSCULAR | Status: DC | PRN
  Administered 2024-01-15: 4 mg via INTRAVENOUS

## 2024-01-15 MED ORDER — PROPOFOL 10 MG/ML IV BOLUS
INTRAVENOUS | Status: DC | PRN
  Administered 2024-01-15: 150 mg via INTRAVENOUS

## 2024-01-15 MED ORDER — SCOPOLAMINE 1 MG/3DAYS TD PT72
MEDICATED_PATCH | TRANSDERMAL | Status: AC
  Filled 2024-01-15: qty 1

## 2024-01-15 MED ORDER — ONDANSETRON HCL 4 MG/2ML IJ SOLN
INTRAMUSCULAR | Status: AC
  Filled 2024-01-15: qty 2

## 2024-01-15 MED ORDER — LIDOCAINE 2% (20 MG/ML) 5 ML SYRINGE
INTRAMUSCULAR | Status: DC | PRN
  Administered 2024-01-15: 60 mg via INTRAVENOUS

## 2024-01-15 MED ORDER — ONDANSETRON HCL 4 MG PO TABS: 4.0000 mg | ORAL_TABLET | Freq: Four times a day (QID) | ORAL | Status: DC | PRN

## 2024-01-15 MED ORDER — LACTATED RINGERS IV SOLN: INTRAVENOUS | Status: DC

## 2024-01-15 MED ORDER — OXYCODONE HCL 5 MG PO TABS: 5.0000 mg | ORAL_TABLET | ORAL | Status: DC | PRN

## 2024-01-15 SURGICAL SUPPLY — 11 items
DRSG TELFA 3X8 NADH STRL (GAUZE/BANDAGES/DRESSINGS) IMPLANT
ELECT REM PT RETURN 9FT ADLT (ELECTROSURGICAL) ×1 IMPLANT
ELECTRODE REM PT RTRN 9FT ADLT (ELECTROSURGICAL) IMPLANT
GLOVE BIOGEL PI IND STRL 6 (GLOVE) ×1 IMPLANT
GLOVE BIOGEL PI MICRO STRL 5.5 (GLOVE) ×1 IMPLANT
GOWN STRL REUS W/ TWL LRG LVL3 (GOWN DISPOSABLE) ×1 IMPLANT
IV CATH 18G SAFETY (IV SOLUTION) IMPLANT
KIT TURNOVER KIT B (KITS) ×1 IMPLANT
MANIFOLD NEPTUNE II (INSTRUMENTS) ×1 IMPLANT
PACK CYSTO (CUSTOM PROCEDURE TRAY) ×1 IMPLANT
SLEEVE SCD COMPRESS KNEE MED (STOCKING) ×1 IMPLANT

## 2024-01-15 NOTE — Anesthesia Postprocedure Evaluation (Signed)
 Anesthesia Post Note  Patient: [name]  Procedure(s) Performed: CYSTOSCOPY     Patient location during evaluation: PACU Anesthesia Type: General Level of consciousness: awake and alert Pain management: pain level controlled Vital Signs Assessment: post-procedure vital signs reviewed and stable Respiratory status: spontaneous breathing, nonlabored ventilation and respiratory function stable Cardiovascular status: blood pressure returned to baseline and stable Postop Assessment: no apparent nausea or vomiting Anesthetic complications: no  No notable events documented.  Last Vitals:  Vitals:   01/15/24 1845 01/15/24 1900  BP: (!) 141/89 138/80  Pulse: 91 88  Resp: 10 15  Temp:  (!) 36.3 C  SpO2: 100% 98%    Last Pain:  Vitals:   01/15/24 1900  TempSrc:   PainSc: 0-No pain                 Jahmar Mckelvy,W. EDMOND

## 2024-01-15 NOTE — Progress Notes (Signed)
 Patient very anxious and requesting to be medicated prior to procedure to "calm my nerves." Dr. Jean Rosenthal with anesthesia made aware. Stated patient  needed to speak with surgeon and have all questions answered prior to receiving sedative. Surgeon notified. Awaiting surgeon to see patient at this time.

## 2024-01-15 NOTE — Anesthesia Preprocedure Evaluation (Addendum)
 Anesthesia Evaluation  Patient identified by MRN, date of birth, ID band Patient awake    Reviewed: Allergy & Precautions, NPO status , Patient's Chart, lab work & pertinent test results  History of Anesthesia Complications Negative for: history of anesthetic complications  Airway Mallampati: I  TM Distance: >3 FB Neck ROM: Full    Dental  (+) Dental Advisory Given   Pulmonary Current Smoker and Patient abstained from smoking.   breath sounds clear to auscultation       Cardiovascular negative cardio ROS  Rhythm:Regular Rate:Normal     Neuro/Psych  Headaches  Anxiety Depression       GI/Hepatic negative GI ROS, Neg liver ROS,,,  Endo/Other  negative endocrine ROS    Renal/GU negative Renal ROS     Musculoskeletal  (+)  Fibromyalgia -  Abdominal   Peds  Hematology negative hematology ROS (+)   Anesthesia Other Findings   Reproductive/Obstetrics                             Anesthesia Physical Anesthesia Plan  ASA: 2  Anesthesia Plan: General   Post-op Pain Management: Tylenol PO (pre-op)*   Induction: Intravenous  PONV Risk Score and Plan: 2 and Ondansetron and Dexamethasone  Airway Management Planned: LMA  Additional Equipment: None  Intra-op Plan:   Post-operative Plan:   Informed Consent: I have reviewed the patients History and Physical, chart, labs and discussed the procedure including the risks, benefits and alternatives for the proposed anesthesia with the patient or authorized representative who has indicated his/her understanding and acceptance.     Dental advisory given  Plan Discussed with: CRNA and Surgeon  Anesthesia Plan Comments:        Anesthesia Quick Evaluation

## 2024-01-15 NOTE — Op Note (Signed)
 Operative Note  Preoperative Diagnosis: bladder lesion, mixed urinary incontinence  Postoperative Diagnosis: bladder lesion, mixed urinary incontinence  Procedures performed:  Cystourethroscopy, Bladder biopsy, fulguration of bladder lesion  Implants: * No implants in log *  Attending Surgeon: Madaline Brilliant, MD  Assistant Surgeon: n/a  Anesthesia: General LMA  Findings: 1. On cystoscopy, approximately 1-2cm bladder lesion medial to right ureteral orifice. Biopsy obtained and fulguration without injury, perforation, or bleeding. Brisk bilateral ureteral efflux present.     Specimens:  ID Type Source Tests Collected by Time Destination  1 : Right Bladder lesion Tissue PATH Soft tissue SURGICAL PATHOLOGY Loleta Chance, MD 01/15/2024 1817     Estimated blood loss: 5 mL  IV fluids: 600 mL  Urine output: 50 mL  Complications: None  Procedure in Detail: After informed consent was obtained, the patient was taken to the operating room where she was placed under anesthesia.  She was then placed in the dorsal lithotomy position with Allen stirrups and prepped and draped in the usual sterile fashion.  Care was taken to avoid hyperflexion or hyperextension of her lower extremities.  A time out was performed. The periurethral area was prepped and draped in a sterile manner.  The urethra and bladder visualized with 30-degree scope and noted right sided yellow/white flat bladder lesion medial to the ureteral orifice. The rigid endoscopic bladder biopsy forceps were introduced and a bladder biopsy was obtained from the medial border of the lesion. The lesion was then fulgurated for hemostasis. She had Normal urethral coaptation and Normal urethral mucosa. She had bilateral clear efflux from both ureteral orifices. She had no squamous metaplasia at the trigone, no trabeculations, cellules or diverticuli.   The patient tolerated the procedure well.  She was awakened from anesthesia and transferred to  the recovery room in stable condition. All needle and sponge counts were correct x 2.

## 2024-01-15 NOTE — Transfer of Care (Signed)
 Immediate Anesthesia Transfer of Care Note  Patient: [name]  Procedure(s) Performed: CYSTOSCOPY  Patient Location: PACU  Anesthesia Type:General  Level of Consciousness: awake, alert , and oriented  Airway & Oxygen Therapy: Patient Spontanous Breathing and Patient connected to face mask oxygen  Post-op Assessment: Report given to RN and Post -op Vital signs reviewed and stable  Post vital signs: Reviewed and stable  Last Vitals:  Vitals Value Taken Time  BP 129/78 01/15/24 1834  Temp    Pulse 107 01/15/24 1840  Resp 16 01/15/24 1840  SpO2 100 % 01/15/24 1840  Vitals shown include unfiled device data.  Last Pain:  Vitals:   01/15/24 1427  TempSrc:   PainSc: 0-No pain         Complications: No notable events documented.

## 2024-01-15 NOTE — Interval H&P Note (Signed)
 History and Physical Interval Note:  01/15/2024 5:52 PM  [name]  has presented today for surgery, with the diagnosis of recurrent UTI.  The various methods of treatment have been discussed with the patient and family. After consideration of risks, benefits and other options for treatment, the patient has consented to  Procedure(s) with comments: CYSTOSCOPY (N/A) - bladder biopsy with fulguration as a surgical intervention.  The patient's history has been reviewed, patient examined, no change in status, stable for surgery.  I have reviewed the patient's chart and labs.  Questions were answered to the patient's satisfaction.     [name]

## 2024-01-15 NOTE — Anesthesia Procedure Notes (Signed)
 Procedure Name: LMA Insertion Date/Time: 01/15/2024 6:01 PM  Performed by: Sudie Grumbling, CRNAPre-anesthesia Checklist: Patient identified, Emergency Drugs available, Suction available and Patient being monitored Patient Re-evaluated:Patient Re-evaluated prior to induction Oxygen Delivery Method: Circle system utilized Preoxygenation: Pre-oxygenation with 100% oxygen Induction Type: IV induction LMA: LMA inserted LMA Size: 4.0 Number of attempts: 1 Tube secured with: Tape Dental Injury: Teeth and Oropharynx as per pre-operative assessment

## 2024-01-16 ENCOUNTER — Telehealth: Payer: Self-pay

## 2024-01-16 ENCOUNTER — Encounter (HOSPITAL_COMMUNITY): Payer: Self-pay | Admitting: Obstetrics

## 2024-01-16 DIAGNOSIS — N898 Other specified noninflammatory disorders of vagina: Secondary | ICD-10-CM

## 2024-01-16 MED ORDER — FLUCONAZOLE 150 MG PO TABS: 150.0000 mg | ORAL_TABLET | Freq: Once | ORAL | 1 refills | Status: AC

## 2024-01-16 NOTE — Telephone Encounter (Signed)
 [name]  underwent Cystoscopy  on 01/15/2024  with [] Dr Florian Buff [x] Dr Olena Leatherwood.  The patient reports that her pain is controlled.  She is taking [x] No Medication [] Acetaminophen 500mg  every 6 hours [] Ibuprofen 600mg  every 6 hours or []  Prescribed Narcotic.  Her pain level is 0[] with medication [] Without medication is.   She denies vaginal bleeding.   Patient has complaints of vaginal itching. She is concerned she might be getting an infection, she has tingling with urination. And feels like she has air passing from the vagina.   The patient is tolerating PO fluids and solids. She has not had a bowel movement and is not taking Miralax for a bowel regimen. She is not not passing gas.  She was discharged without a catheter.   [x]  Discharged without a catheter, the patient does feel as if she is emptying her bladder.  She does having any additional questions.  Reviewed Post operative instructions as needed to answer additional questions.   CC'd note to patient's provider.

## 2024-01-16 NOTE — Telephone Encounter (Signed)
 Vaginal irritation possibly due to betadine prep.  Rx diflucan for presumed yeast infection, can start if persistent or worsening symptoms.  Start miralax and use transcutaneous Vivally device for bowel movements.  If she continues to experience changes in bowel movement or persist symptoms, pt to present to office for evaluation.

## 2024-01-17 ENCOUNTER — Ambulatory Visit: Payer: Self-pay | Attending: Obstetrics | Admitting: Physical Therapy

## 2024-01-17 ENCOUNTER — Encounter: Payer: Self-pay | Admitting: Obstetrics

## 2024-01-17 ENCOUNTER — Encounter: Payer: Self-pay | Admitting: Physical Therapy

## 2024-01-17 DIAGNOSIS — R252 Cramp and spasm: Secondary | ICD-10-CM | POA: Diagnosis present

## 2024-01-17 DIAGNOSIS — L905 Scar conditions and fibrosis of skin: Secondary | ICD-10-CM | POA: Insufficient documentation

## 2024-01-17 DIAGNOSIS — M6281 Muscle weakness (generalized): Secondary | ICD-10-CM | POA: Diagnosis present

## 2024-01-17 DIAGNOSIS — R102 Pelvic and perineal pain: Secondary | ICD-10-CM | POA: Diagnosis present

## 2024-01-17 LAB — SURGICAL PATHOLOGY

## 2024-01-17 NOTE — Therapy (Signed)
 OUTPATIENT PHYSICAL THERAPY FEMALE PELVIC TREATMENT   Patient Name: [name] MRN: [id number] DOB:03-14-1971, 53 y.o., female Today's Date: 01/17/2024  END OF SESSION:  PT End of Session - 01/17/24 1609     Visit Number 7    Date for PT Re-Evaluation 02/16/24    Authorization Type [employer] medicare    Authorization - Number of Visits 7    Progress Note Due on Visit 10    PT Start Time 1610    PT Stop Time 1650    PT Time Calculation (min) 40 min    Activity Tolerance Patient tolerated treatment well    Behavior During Therapy WFL for tasks assessed/performed             Past Medical History:  Diagnosis Date   ADD (attention deficit disorder)    Anxiety    Arthropathy, unspecified, site unspecified    Chronic fatigue syndrome    Depression    Disc degeneration    Fibromyalgia    History of kidney stones    last stone in 2010   Migraines    Multilevel degenerative disc disease    cervical and lumbar, has had injections/epidural   Panic attack 2019   PTSD (post-traumatic stress disorder) 2019   Sleep disorder 01/08/2014   Sleep disturbance, unspecified    Symptomatic mammary hypertrophy 09/2018   Unspecified nonpsychotic mental disorder    Past Surgical History:  Procedure Laterality Date   BILATERAL SALPINGECTOMY Bilateral 10/16/2013   Procedure: BILATERAL SALPINGECTOMY;  Surgeon: Lenoard Aden, MD;  Location: [facility];  Service: Gynecology;  Laterality: Bilateral;   BREAST REDUCTION SURGERY Bilateral 10/03/2018   Procedure: BILATERAL BREAST REDUCTION;  Surgeon: Peggye Form, DO;  Location: [facility];  Service: Plastics;  Laterality: Bilateral;   CESAREAN SECTION     x2   CHOLECYSTECTOMY     CYSTOSCOPY N/A 01/15/2024   Procedure: CYSTOSCOPY;  Surgeon: Loleta Chance, MD;  Location: [facility];  Service: Gynecology;  Laterality: N/A;  bladder biopsy with fulguration   DIAGNOSTIC LAPAROSCOPY     ENDOVENOUS ABLATION SAPHENOUS VEIN W/  LASER Right 10/26/2023   endovenous laser ablation right greater saphenous vein and stab phlebectomy10-20 incisions right leg by Lemar Livings MD   MANDIBLE SURGERY     REMOVAL OF URINARY SLING N/A 01/09/2018   Procedure: REMOVAL OF MESH URINARY SLING EXTRUSION  CYSTOSTOPY;  Surgeon: Alfredo Martinez, MD;  Location: [facility];  Service: Urology;  Laterality: N/A;   ROBOTIC ASSISTED TOTAL HYSTERECTOMY N/A 10/16/2013   Procedure: ROBOTIC ASSISTED TOTAL HYSTERECTOMY;  Surgeon: Lenoard Aden, MD;  Location: [facility];  Service: Gynecology;  Laterality: N/A;   ROTATOR CUFF REPAIR Left 08/21/2023   Patient Active Problem List   Diagnosis Date Noted   Lesion of bladder 01/12/2024   Dyspareunia in female 08/18/2023   Constipation 08/18/2023   Nocturia 08/18/2023   Urinary incontinence, mixed 08/18/2023   History of pelvic surgery 08/18/2023   History of recurrent UTI (urinary tract infection) 08/18/2023   Vaginal atrophy 08/18/2023   Vaginal irritation 08/18/2023   Major depressive disorder, recurrent episode (HCC) 06/19/2022   History of COVID-19 01/21/2020   Chronic cough 01/21/2020   Migraine without status migrainosus, not intractable 01/21/2020   Tinnitus of both ears 01/21/2020   Neck pain 08/18/2018   Back pain 08/18/2018   Symptomatic mammary hypertrophy 08/18/2018   Obesity, Class II, BMI 35-39.9 09/29/2017   Depression 03/31/2017   Thyroid disease 03/31/2017  Sleep disorder 01/08/2014   Fibroids 10/16/2013    PCP: Creola Corn, MD  REFERRING PROVIDER: Loleta Chance, MD   REFERRING DIAG:  N94.10 (ICD-10-CM) - Dyspareunia in female  K24.00 (ICD-10-CM) - Constipation, unspecified constipation type  R35.1 (ICD-10-CM) - Nocturia  N39.46 (ICD-10-CM) - Urinary incontinence, mixed  Z98.890 (ICD-10-CM) - History of pelvic surgery    THERAPY DIAG:  Muscle weakness (generalized)  Cramp and spasm  Pelvic pain  Scar  Rationale for Evaluation and Treatment:  Rehabilitation  ONSET DATE: 2018  SUBJECTIVE:                                                                                                                                                                                           SUBJECTIVE STATEMENT: I am having trouble with relaxing my bladder. I have chronic inflammation of the bladder. Patient has to sit on the toilet for more urine to come out. I have not had intercourse to see if it is painful. No urinary leakage.   Fluid intake: Yes: water but not enough, low fluid intake    PAIN:  Are you having pain? Yes NPRS scale: 5/10 Pain location: Vaginal, feels like the left side gets irritated  Pain type: sharp Pain description: intermittent   Aggravating factors: penile penetration vaginally Relieving factors: no penile penetration  PRECAUTIONS: None  RED FLAGS: None   WEIGHT BEARING RESTRICTIONS: No  FALLS:  Has patient fallen in last 6 months? 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  Patient has stool leakage.  01/17/24: Patient is straining again with bowel movements. She is having hard balls of stool. No stool leakage.    URINATION: Pain with urination: Yes, tingling pin stab  Fully empty bladder: Yes:   01/17/24: not emptying bladder and has to sit for more urine to come out Stream: Strong Urgency: Yes:   unable to stop the flow of urine when leaking 01/17/24: no urinary urgency or bladder spasms Frequency: night gets up 1 per night; day  urinate 1-2 times Leakage: Urge to void, Walking to  the bathroom, Coughing, Sneezing, Laughing, Lifting, and Bending forward 01/17/24: no urinary leakage Pads: Yes: 3-5 per day 01/17/24: not wearing pads  INTERCOURSE: Pain with intercourse: Initial Penetration and During Penetration, pain  on left side Ability to have vaginal penetration:  Yes:   Climax: not often Marinoff Scale: 2/3  PREGNANCY: C-section deliveries 2  OBJECTIVE:  Note: Objective measures were completed at Evaluation unless otherwise noted.  DIAGNOSTIC FINDINGS:  none   COGNITION: Overall cognitive status: Within functional limits for tasks assessed     SENSATION: Light touch: Appears intact Proprioception: Appears intact    POSTURE: rounded shoulders, forward head, decreased lumbar lordosis, and feet turned outward  PELVIC ALIGNMENT:ASIS are equal   LOWER EXTREMITY XNA:[id number] hip ROM is full   LOWER EXTREMITY MMT: bilateral hip strength is 4-/5   PALPATION:   General  decreased movement of the lower abdominal scar; fascial tightness in the abdomen.                 External Perineal Exam tenderness located in the clitoris, along the ischiocavernosus, and perineal body                             Internal Pelvic Floor tenderness located in the levator ani, obturator internist, sides of the bladder, urethra, along the anterior wall of the vaginal canal  Patient confirms identification and approves PT to assess internal pelvic floor and treatment Yes  PELVIC MMT:   MMT eval 12/26/23  Vaginal 2/5 4/5  Internal Anal Sphincter 3/5   External Anal Sphincter 3/5   Puborectalis 3/5   (Blank rows = not tested)        TONE: Increased tone on the left pelvic floor  PROLAPSE: none  TODAY'S TREATMENT:  01/17/24 Manual: Soft tissue mobilization: Circular massage of the abdomen to promote peristalic motion of the intestines to improve bowel movements Manual work along the diaphragm Manual work around the obliques Scar tissue mobilization: Pulling  on the scar in different ways manually to increase further stretch of the scar Neuromuscular re-education: Core retraining: Core facilitation: Hip flexion isometric working on core engagement and feels it more on right than the left Supine ball squeeze with alternate shoulder flexion holding 1# with core engaged Laying on side and press top hand into ball to engage the lower abdominals 10 x each side    12/26/23 Manual: Scar tissue mobilization: Using the suction cup to pull up the scar in different directions Pulling on the scar in different ways manually to increase further stretch of the scar Myofascial release: Fascial release of the mesenteric root, around the umbilicus and lateral abdomen to work on the fascial restrictions Internal pelvic floor techniques: No emotional/communication barriers or cognitive limitation. Patient is motivated to learn. Patient understands and agrees with treatment goals and plan. PT explains patient will be examined in standing, sitting, and lying down to see how their muscles and joints work. When they are ready, they will be asked to remove their underwear so PT can examine their perineum. The patient is also given the option of providing their own chaperone as one is not provided in our facility. The patient also has the right and is explained the right to defer or refuse any part of the evaluation or treatment including the internal exam. With the patient's consent, PT will use one gloved finger to gently assess the muscles of the pelvic floor, seeing how well it contracts and relaxes and if there is muscle symmetry. After, the patient will get dressed and PT and patient will discuss exam findings and plan of care. PT and patient discuss plan of care, schedule, attendance  policy and HEP activities.  Going through the vaginal canal working on the levator ani, obturator internist and ischiocavernosus.  Self-care: Reviewed on how to use the vaginal wand and  therapist used her finger internally to show patient on the correct stroke and where to place the wand on the pelvic floor muscles.  Reviewed with patient on using the estrogen cream and using her finger to apply the cream so it would be more comfortable    12/22/23 Manual: Scar tissue mobilization: Manual work to the lower abdominal scar to go through the restrictions and elongate the tissue Myofascial release: Fascial release in supine to the lower abdomin to go through the restrictions and improve tissue mobility and improve intestinal mobility Quadruped mobilization around the bladder and lower abdomen with different trunk movements Self-care: Education on using the vaginal wand, using it in the bathtub and tips of holding the wand and placing it into the vaginal canal Discussed with patient on the importance of the vaginal moisturizer to assist with reduction of UTI's and reviewed the placement of the moisturizer.          PATIENT EDUCATION:  11/24/23 Education details: educated patient on vaginal wand to purchase to work on the pelvic floor trigger points and where to purchase it.  Person educated: Patient Education method: Explanation, Demonstration, Tactile cues, Verbal cues, and Handouts Education comprehension: verbalized understanding, returned demonstration, verbal cues required, tactile cues required, and needs further education  HOME EXERCISE PROGRAM: See above.   ASSESSMENT:  CLINICAL IMPRESSION: Patient is a 53 y.o. female who was seen today for physical therapy  treatment for dyspareunia, constipation, urinary incontinence, and history of pelvic surgery. Patient has not leaked urine with coughing and sneezing since last visit.  No stool leakage since last visit just has to wipe often. She now has type 1 bowel movements with straining to have one. Patient is having issues with constipation again. She had a biopsy of her bladder and it showed chronic inflammation in the  bladder. She is having a set back.  She has not had intercourse to see if there is pain.  Patient will benefit from skilled therapy to improve pelvic floor coordination and strength while reducing her pain.    OBJECTIVE IMPAIRMENTS: decreased activity tolerance, decreased coordination, decreased strength, increased fascial restrictions, increased muscle spasms, and pain.   ACTIVITY LIMITATIONS: lifting, bending, sitting, standing, squatting, continence, toileting, and locomotion level  PARTICIPATION LIMITATIONS: meal prep, cleaning, laundry, interpersonal relationship, driving, shopping, and community activity  PERSONAL FACTORS: Fitness and 3+ comorbidities: ADD; Chronic Fatique Syndrome; Fibromyalgia; PTSD; Cholecystectomy; Cesarean sectoin; Total hysterectomy 2014; bladder sling 2017 with mesh; Mesh removed 2018  are also affecting patient's functional outcome.   REHAB POTENTIAL: Good  CLINICAL DECISION MAKING: Evolving/moderate complexity  EVALUATION COMPLEXITY: Moderate   GOALS: Goals reviewed with patient? Yes  SHORT TERM GOALS: Target date: 12/22/23  Patient educated on using a vaginal wand on the pelvic floor muscles to reduce the trigger points.  Baseline: Goal status: Met 12/04/23  2.  Patient able to perform diaphragmatic breathing to elongate the pelvic floor muscles.  Baseline:  Goal status: Met 12/26/23  3.  Patient educated on initial HEP for flexibility exercises.  Baseline:  Goal status: Met 12/22/23  4.  Patient educated on scar massage to reduce restrictions of the lower abdomen.  Baseline:  Goal status: Met 12/04/23  LONG TERM GOALS: Target date: 02/16/24  Patient is independent with advanced HEP for core and pelvic floor.  Baseline:  Goal status: INITIAL  2.  Patient is able to have penile penetration vaginally due to the reduction of trigger points of the pelvic floor.  Baseline:  Goal status: INITIAL  3.  Patient reports her urinary leakage decreased >/=  75% due to increased strength of the pelvic floor >/= 3/5 with circular hug of therapist finger.  Baseline:  Goal status: met 01/17/24  4.  Patient reports her stool leakage has decreased >/= 75% due to reduction of trigger points in the pelvic floor.  Baseline:  Goal status: INITIAL  5.  Patient has improved mobility of the lower abdominal scar so the twisted knot feeling is reduced >/= 50%.  Baseline:  Goal status: Met 12/26/23    PLAN:  PT FREQUENCY: 1-2x/week  PT DURATION: 6 months  PLANNED INTERVENTIONS: 97110-Therapeutic exercises, 97530- Therapeutic activity, 97112- Neuromuscular re-education, 97535- Self Care, 16109- Manual therapy, [phone]- Aquatic Therapy, 97014- Electrical stimulation (unattended), 97035- Ultrasound, Patient/Family education, Dry Needling, Joint mobilization, Spinal mobilization, Scar mobilization, Cryotherapy, Moist heat, and Biofeedback  PLAN FOR NEXT SESSION: work on core strength, manual work to pelvic floor  Eulis Foster, PT 01/17/24 4:57 PM

## 2024-01-18 NOTE — Telephone Encounter (Signed)
 I spoke with patient and she said she doesn't have any UTI sx, it felt more like a bladder spasm. She went to pelvic floor therapy yesterday, she stated, it may have happened due to the therapist loosening up muscles. She said she will give [name] a call back if it continues to happen.

## 2024-01-22 DIAGNOSIS — M25512 Pain in left shoulder: Secondary | ICD-10-CM | POA: Diagnosis not present

## 2024-01-22 DIAGNOSIS — M6281 Muscle weakness (generalized): Secondary | ICD-10-CM | POA: Diagnosis not present

## 2024-01-22 DIAGNOSIS — M79602 Pain in left arm: Secondary | ICD-10-CM | POA: Diagnosis not present

## 2024-01-29 ENCOUNTER — Encounter: Payer: Self-pay | Admitting: Physical Therapy

## 2024-01-29 ENCOUNTER — Ambulatory Visit: Admitting: Physical Therapy

## 2024-01-29 DIAGNOSIS — R252 Cramp and spasm: Secondary | ICD-10-CM | POA: Diagnosis not present

## 2024-01-29 DIAGNOSIS — M25512 Pain in left shoulder: Secondary | ICD-10-CM | POA: Diagnosis not present

## 2024-01-29 DIAGNOSIS — L905 Scar conditions and fibrosis of skin: Secondary | ICD-10-CM | POA: Diagnosis not present

## 2024-01-29 DIAGNOSIS — R102 Pelvic and perineal pain: Secondary | ICD-10-CM

## 2024-01-29 DIAGNOSIS — M6281 Muscle weakness (generalized): Secondary | ICD-10-CM

## 2024-01-29 DIAGNOSIS — M79602 Pain in left arm: Secondary | ICD-10-CM | POA: Diagnosis not present

## 2024-01-29 NOTE — Therapy (Signed)
 OUTPATIENT PHYSICAL THERAPY FEMALE PELVIC TREATMENT   Patient Name: [name] MRN: [id number] DOB:1970-11-30, 53 y.o., female Today's Date: 01/29/2024  END OF SESSION:  PT End of Session - 01/29/24 1233     Visit Number 8    Date for PT Re-Evaluation 02/16/24    Authorization Type [employer] medicare    PT Start Time 1230    PT Stop Time 1310    PT Time Calculation (min) 40 min    Activity Tolerance Patient tolerated treatment well    Behavior During Therapy WFL for tasks assessed/performed             Past Medical History:  Diagnosis Date   ADD (attention deficit disorder)    Anxiety    Arthropathy, unspecified, site unspecified    Chronic fatigue syndrome    Depression    Disc degeneration    Fibromyalgia    History of kidney stones    last stone in 2010   Migraines    Multilevel degenerative disc disease    cervical and lumbar, has had injections/epidural   Panic attack 2019   PTSD (post-traumatic stress disorder) 2019   Sleep disorder 01/08/2014   Sleep disturbance, unspecified    Symptomatic mammary hypertrophy 09/2018   Unspecified nonpsychotic mental disorder    Past Surgical History:  Procedure Laterality Date   BILATERAL SALPINGECTOMY Bilateral 10/16/2013   Procedure: BILATERAL SALPINGECTOMY;  Surgeon: Camillo Celestine, MD;  Location: [facility];  Service: Gynecology;  Laterality: Bilateral;   BREAST REDUCTION SURGERY Bilateral 10/03/2018   Procedure: BILATERAL BREAST REDUCTION;  Surgeon: Thornell Flirt, DO;  Location: [facility];  Service: Plastics;  Laterality: Bilateral;   CESAREAN SECTION     x2   CHOLECYSTECTOMY     CYSTOSCOPY N/A 01/15/2024   Procedure: CYSTOSCOPY;  Surgeon: Darlene Ehlers, MD;  Location: [facility];  Service: Gynecology;  Laterality: N/A;  bladder biopsy with fulguration   DIAGNOSTIC LAPAROSCOPY     ENDOVENOUS ABLATION SAPHENOUS VEIN W/ LASER Right 10/26/2023   endovenous laser ablation right greater  saphenous vein and stab phlebectomy10-20 incisions right leg by Angela Kell MD   MANDIBLE SURGERY     REMOVAL OF URINARY SLING N/A 01/09/2018   Procedure: REMOVAL OF MESH URINARY SLING EXTRUSION  CYSTOSTOPY;  Surgeon: Erman Hayward, MD;  Location: [facility];  Service: Urology;  Laterality: N/A;   ROBOTIC ASSISTED TOTAL HYSTERECTOMY N/A 10/16/2013   Procedure: ROBOTIC ASSISTED TOTAL HYSTERECTOMY;  Surgeon: Camillo Celestine, MD;  Location: [facility];  Service: Gynecology;  Laterality: N/A;   ROTATOR CUFF REPAIR Left 08/21/2023   Patient Active Problem List   Diagnosis Date Noted   Lesion of bladder 01/12/2024   Dyspareunia in female 08/18/2023   Constipation 08/18/2023   Nocturia 08/18/2023   Urinary incontinence, mixed 08/18/2023   History of pelvic surgery 08/18/2023   History of recurrent UTI (urinary tract infection) 08/18/2023   Vaginal atrophy 08/18/2023   Vaginal irritation 08/18/2023   Major depressive disorder, recurrent episode (HCC) 06/19/2022   History of COVID-19 01/21/2020   Chronic cough 01/21/2020   Migraine without status migrainosus, not intractable 01/21/2020   Tinnitus of both ears 01/21/2020   Neck pain 08/18/2018   Back pain 08/18/2018   Symptomatic mammary hypertrophy 08/18/2018   Obesity, Class II, BMI 35-39.9 09/29/2017   Depression 03/31/2017   Thyroid disease 03/31/2017   Sleep disorder 01/08/2014   Fibroids 10/16/2013    PCP: Margarete Sharps, MD  REFERRING PROVIDER: Aron Lard,  Santina Cull, MD   REFERRING DIAG:  N94.10 (ICD-10-CM) - Dyspareunia in female  K40.00 (ICD-10-CM) - Constipation, unspecified constipation type  R35.1 (ICD-10-CM) - Nocturia  N39.46 (ICD-10-CM) - Urinary incontinence, mixed  Z98.890 (ICD-10-CM) - History of pelvic surgery    THERAPY DIAG:  Muscle weakness (generalized)  Cramp and spasm  Pelvic pain  Scar  Rationale for Evaluation and Treatment: Rehabilitation  ONSET DATE: 2018  SUBJECTIVE:                                                                                                                                                                                            SUBJECTIVE STATEMENT: I had a bladder spasm and urinated on myself. Still have pain with penile penetration.   Fluid intake: Yes: water but not enough, low fluid intake    PAIN:  Are you having pain? Yes NPRS scale: 5/10 Pain location: Vaginal, feels like the left side gets irritated  Pain type: sharp Pain description: intermittent   Aggravating factors: penile penetration vaginally Relieving factors: no penile penetration  PRECAUTIONS: None  RED FLAGS: None   WEIGHT BEARING RESTRICTIONS: No  FALLS:  Has patient fallen in last 6 months? 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  Patient has stool leakage.  01/17/24: Patient is straining again with bowel movements. She is having hard balls of stool. No stool leakage.    URINATION: Pain with urination: Yes, tingling pin stab  Fully empty bladder: Yes:   01/17/24: not emptying bladder and has to sit for more urine to come out Stream: Strong Urgency: Yes:   unable to stop the flow of urine when leaking 01/17/24: no urinary urgency or bladder spasms Frequency: night gets up 1 per night; day  urinate 1-2 times Leakage: Urge to void, Walking to the bathroom, Coughing, Sneezing, Laughing, Lifting, and Bending forward 01/17/24: no urinary leakage Pads: Yes: 3-5 per day 01/17/24: not wearing pads  INTERCOURSE: Pain with intercourse: Initial Penetration and During  Penetration, pain on left side Ability to have vaginal penetration:  Yes:   Climax: not often Marinoff Scale: 2/3  PREGNANCY: C-section deliveries 2  OBJECTIVE:  Note: Objective measures were completed at Evaluation unless otherwise noted.  DIAGNOSTIC FINDINGS:  none   COGNITION: Overall  cognitive status: Within functional limits for tasks assessed     SENSATION: Light touch: Appears intact Proprioception: Appears intact    POSTURE: rounded shoulders, forward head, decreased lumbar lordosis, and feet turned outward  PELVIC ALIGNMENT:ASIS are equal   LOWER EXTREMITY ZOX:[id number] hip ROM is full   LOWER EXTREMITY MMT: bilateral hip strength is 4-/5   PALPATION:   General  decreased movement of the lower abdominal scar; fascial tightness in the abdomen.                 External Perineal Exam tenderness located in the clitoris, along the ischiocavernosus, and perineal body                             Internal Pelvic Floor tenderness located in the levator ani, obturator internist, sides of the bladder, urethra, along the anterior wall of the vaginal canal  Patient confirms identification and approves PT to assess internal pelvic floor and treatment Yes  PELVIC MMT:   MMT eval 12/26/23 01/29/24  Vaginal 2/5 4/5 4/5  Internal Anal Sphincter 3/5    External Anal Sphincter 3/5    Puborectalis 3/5    (Blank rows = not tested)        TONE: Increased tone on the left pelvic floor  PROLAPSE: none  TODAY'S TREATMENT:  01/29/24 Manual: Internal pelvic floor techniques: No emotional/communication barriers or cognitive limitation. Patient is motivated to learn. Patient understands and agrees with treatment goals and plan. PT explains patient will be examined in standing, sitting, and lying down to see how their muscles and joints work. When they are ready, they will be asked to remove their underwear so PT can examine their perineum. The patient is also given the option  of providing their own chaperone as one is not provided in our facility. The patient also has the right and is explained the right to defer or refuse any part of the evaluation or treatment including the internal exam. With the patient's consent, PT will use one gloved finger to gently assess the muscles of the pelvic floor, seeing how well it contracts and relaxes and if there is muscle symmetry. After, the patient will get dressed and PT and patient will discuss exam findings and plan of care. PT and patient discuss plan of care, schedule, attendance policy and HEP activities.  Going through the vaginal opening working on the perineal body, along the posterior fourchette, along the ischiocavernosus, along the levator ani to lengthen and reduce pain with penile penetration Neuromuscular re-education: Pelvic floor contraction training: Therapist finger in the vaginal canal working on contraction and full relaxation, not holding her breath, equal anterior and posterior contraction    01/17/24 Manual: Soft tissue mobilization: Circular massage of the abdomen to promote peristalic motion of the intestines to improve bowel movements Manual work along the diaphragm Manual work around the obliques Scar tissue mobilization: Pulling on the scar in different ways manually to increase further stretch of the scar Neuromuscular re-education: Core retraining: Core facilitation: Hip flexion isometric working on core engagement and feels it more on right than the left Supine ball squeeze with alternate shoulder flexion holding 1# with core engaged Laying on side and press top hand into ball to engage the lower abdominals 10 x each side    12/26/23 Manual: Scar tissue mobilization: Using the suction cup to pull up the scar in different directions Pulling on the scar in different ways manually to  increase further stretch of the scar Myofascial release: Fascial release of the mesenteric root, around the  umbilicus and lateral abdomen to work on the fascial restrictions Internal pelvic floor techniques: No emotional/communication barriers or cognitive limitation. Patient is motivated to learn. Patient understands and agrees with treatment goals and plan. PT explains patient will be examined in standing, sitting, and lying down to see how their muscles and joints work. When they are ready, they will be asked to remove their underwear so PT can examine their perineum. The patient is also given the option of providing their own chaperone as one is not provided in our facility. The patient also has the right and is explained the right to defer or refuse any part of the evaluation or treatment including the internal exam. With the patient's consent, PT will use one gloved finger to gently assess the muscles of the pelvic floor, seeing how well it contracts and relaxes and if there is muscle symmetry. After, the patient will get dressed and PT and patient will discuss exam findings and plan of care. PT and patient discuss plan of care, schedule, attendance policy and HEP activities.  Going through the vaginal canal working on the levator ani, obturator internist and ischiocavernosus.  Self-care: Reviewed on how to use the vaginal wand and therapist used her finger internally to show patient on the correct stroke and where to place the wand on the pelvic floor muscles.  Reviewed with patient on using the estrogen cream and using her finger to apply the cream so it would be more comfortable         PATIENT EDUCATION:  11/24/23 Education details: educated patient on vaginal wand to purchase to work on the pelvic floor trigger points and where to purchase it.  Person educated: Patient Education method: Explanation, Demonstration, Tactile cues, Verbal cues, and Handouts Education comprehension: verbalized understanding, returned demonstration, verbal cues required, tactile cues required, and needs further  education  HOME EXERCISE PROGRAM: See above.   ASSESSMENT:  CLINICAL IMPRESSION: Patient is a 53 y.o. female who was seen today for physical therapy  treatment for dyspareunia, constipation, urinary incontinence, and history of pelvic surgery. Patient is not leaking with sneeze and cough. Patient reports she is sitting for 5 minutes to empty her bladder. Patient was able to fully relax her pelvic floor today after contraction. Her strength is 4/5. She continues to have pain with penile penetration. She is using the vaginal wand at home to lengthen her pelvic floor.  Patient will benefit from skilled therapy to improve pelvic floor coordination and strength while reducing her pain.    OBJECTIVE IMPAIRMENTS: decreased activity tolerance, decreased coordination, decreased strength, increased fascial restrictions, increased muscle spasms, and pain.   ACTIVITY LIMITATIONS: lifting, bending, sitting, standing, squatting, continence, toileting, and locomotion level  PARTICIPATION LIMITATIONS: meal prep, cleaning, laundry, interpersonal relationship, driving, shopping, and community activity  PERSONAL FACTORS: Fitness and 3+ comorbidities: ADD; Chronic Fatique Syndrome; Fibromyalgia; PTSD; Cholecystectomy; Cesarean sectoin; Total hysterectomy 2014; bladder sling 2017 with mesh; Mesh removed 2018  are also affecting patient's functional outcome.   REHAB POTENTIAL: Good  CLINICAL DECISION MAKING: Evolving/moderate complexity  EVALUATION COMPLEXITY: Moderate   GOALS: Goals reviewed with patient? Yes  SHORT TERM GOALS: Target date: 12/22/23  Patient educated on using a vaginal wand on the pelvic floor muscles to reduce the trigger points.  Baseline: Goal status: Met 12/04/23  2.  Patient able to perform diaphragmatic breathing to elongate the pelvic floor muscles.  Baseline:  Goal status: Met 12/26/23  3.  Patient educated on initial HEP for flexibility exercises.  Baseline:  Goal status: Met  12/22/23  4.  Patient educated on scar massage to reduce restrictions of the lower abdomen.  Baseline:  Goal status: Met 12/04/23  LONG TERM GOALS: Target date: 02/16/24  Patient is independent with advanced HEP for core and pelvic floor.  Baseline:  Goal status: Ongoing 01/29/24  2.  Patient is able to have penile penetration vaginally due to the reduction of trigger points of the pelvic floor.  Baseline:  Goal status: ongoing 01/29/24  3.  Patient reports her urinary leakage decreased >/= 75% due to increased strength of the pelvic floor >/= 3/5 with circular hug of therapist finger.  Baseline:  Goal status: met 01/17/24  4.  Patient reports her stool leakage has decreased >/= 75% due to reduction of trigger points in the pelvic floor.  Baseline:  Goal status: ongoing 01/29/24  5.  Patient has improved mobility of the lower abdominal scar so the twisted knot feeling is reduced >/= 50%.  Baseline:  Goal status: Met 12/26/23    PLAN:  PT FREQUENCY: 1-2x/week  PT DURATION: 6 months  PLANNED INTERVENTIONS: 97110-Therapeutic exercises, 97530- Therapeutic activity, 97112- Neuromuscular re-education, 97535- Self Care, 09604- Manual therapy, [phone]- Aquatic Therapy, 97014- Electrical stimulation (unattended), 97035- Ultrasound, Patient/Family education, Dry Needling, Joint mobilization, Spinal mobilization, Scar mobilization, Cryotherapy, Moist heat, and Biofeedback  PLAN FOR NEXT SESSION: work on core strength, manual work to pelvic floor, talk about dilators, ask about fecal leakage  Marsha Skeen, PT 01/29/24 2:00 PM

## 2024-02-07 ENCOUNTER — Telehealth: Payer: Self-pay

## 2024-02-07 NOTE — Telephone Encounter (Signed)
 Patient called today with concerns of her EOB from her insurance company. She states they denied her cystoscopy and biopsy done at the hospital on 01-15-2024. Also they denied her Vivally device. This is only the EOB and not a true bill. But I told her I would verify that she had a prior auth completed. Kalany also has concerns about her kidney stone. She states her kidneys hurt when urinating, advised to call Urology as we don't handle the kidney stones as she knows she has one from previous work up.

## 2024-02-14 ENCOUNTER — Encounter: Admitting: Obstetrics

## 2024-02-15 DIAGNOSIS — M549 Dorsalgia, unspecified: Secondary | ICD-10-CM | POA: Diagnosis not present

## 2024-02-15 DIAGNOSIS — E785 Hyperlipidemia, unspecified: Secondary | ICD-10-CM | POA: Diagnosis not present

## 2024-02-15 DIAGNOSIS — E559 Vitamin D deficiency, unspecified: Secondary | ICD-10-CM | POA: Diagnosis not present

## 2024-02-19 DIAGNOSIS — M79602 Pain in left arm: Secondary | ICD-10-CM | POA: Diagnosis not present

## 2024-02-19 DIAGNOSIS — M6281 Muscle weakness (generalized): Secondary | ICD-10-CM | POA: Diagnosis not present

## 2024-02-19 DIAGNOSIS — M25512 Pain in left shoulder: Secondary | ICD-10-CM | POA: Diagnosis not present

## 2024-02-21 DIAGNOSIS — F332 Major depressive disorder, recurrent severe without psychotic features: Secondary | ICD-10-CM | POA: Diagnosis not present

## 2024-02-21 DIAGNOSIS — F1020 Alcohol dependence, uncomplicated: Secondary | ICD-10-CM | POA: Diagnosis not present

## 2024-02-23 DIAGNOSIS — Z0000 Encounter for general adult medical examination without abnormal findings: Secondary | ICD-10-CM | POA: Diagnosis not present

## 2024-02-23 DIAGNOSIS — Z1339 Encounter for screening examination for other mental health and behavioral disorders: Secondary | ICD-10-CM | POA: Diagnosis not present

## 2024-02-23 DIAGNOSIS — G43909 Migraine, unspecified, not intractable, without status migrainosus: Secondary | ICD-10-CM | POA: Diagnosis not present

## 2024-02-23 DIAGNOSIS — Z1331 Encounter for screening for depression: Secondary | ICD-10-CM | POA: Diagnosis not present

## 2024-02-26 ENCOUNTER — Encounter (INDEPENDENT_AMBULATORY_CARE_PROVIDER_SITE_OTHER): Payer: Self-pay

## 2024-02-28 ENCOUNTER — Encounter: Admitting: Obstetrics

## 2024-03-06 DIAGNOSIS — M79602 Pain in left arm: Secondary | ICD-10-CM | POA: Diagnosis not present

## 2024-03-06 DIAGNOSIS — M25512 Pain in left shoulder: Secondary | ICD-10-CM | POA: Diagnosis not present

## 2024-03-06 DIAGNOSIS — M6281 Muscle weakness (generalized): Secondary | ICD-10-CM | POA: Diagnosis not present

## 2024-03-07 ENCOUNTER — Ambulatory Visit (INDEPENDENT_AMBULATORY_CARE_PROVIDER_SITE_OTHER): Admitting: Obstetrics

## 2024-03-07 ENCOUNTER — Encounter: Payer: Self-pay | Admitting: Obstetrics

## 2024-03-07 VITALS — BP 125/81 | HR 81

## 2024-03-07 DIAGNOSIS — N329 Bladder disorder, unspecified: Secondary | ICD-10-CM | POA: Diagnosis not present

## 2024-03-07 DIAGNOSIS — N952 Postmenopausal atrophic vaginitis: Secondary | ICD-10-CM

## 2024-03-07 DIAGNOSIS — N3946 Mixed incontinence: Secondary | ICD-10-CM | POA: Diagnosis not present

## 2024-03-07 DIAGNOSIS — Z87440 Personal history of urinary (tract) infections: Secondary | ICD-10-CM

## 2024-03-07 MED ORDER — ESTRADIOL 0.1 MG/GM VA CREA: 0.5000 g | TOPICAL_CREAM | VAGINAL | 3 refills | Status: AC

## 2024-03-07 MED ORDER — HYDROXYZINE HCL 25 MG PO TABS
25.0000 mg | ORAL_TABLET | Freq: Every evening | ORAL | 1 refills | Status: DC
Start: 2024-03-07 — End: 2024-05-06

## 2024-03-07 NOTE — Assessment & Plan Note (Signed)
-   denies UTI symptoms, hematuria, dysuria, prior PVR 30mL - prior POCT with + uro  - For treatment of recurrent urinary tract infections, we discussed management of recurrent UTIs including prophylaxis with a daily low dose antibiotic, transvaginal estrogen therapy, D-mannose, and cranberry supplements.  We discussed the role of diagnostic testing such as cystoscopy and upper tract imaging.   - Rx Pyridium  and start ibuprofen as needed UTI symptoms, encouraged to avoid antibiotic exposure if unclear lower urinary tract symptoms - resume low dose vaginal estrogen  - prior NuSwab negative for infection - Discussed risk of resistant uropathogens with repeated antibiotic exposure

## 2024-03-07 NOTE — Patient Instructions (Signed)
 For vaginal atrophy (thinning of the vaginal tissue that can cause dryness and burning) and UTI prevention we discussed resumption of estrogen replacement in the form of vaginal cream.   Start vaginal estrogen therapy nightly for two weeks then 2 times weekly at night. This can be placed with your finger or an applicator inside the vagina and around the urethra.  Please let us  know if the prescription is too expensive and we can look for alternative options.   Is vaginal estrogen therapy safe for me? Vaginal estrogen preparations act on the vaginal skin, and only a very tiny amount is absorbed into the bloodstream (0.01%).  They work in a similar way to hand or face cream.  There is minimal absorption and they are therefore perfectly safe. If you have had breast cancer and have persistent troublesome symptoms which aren't settling with vaginal moisturisers and lubricants, local estrogen treatment may be a possibility, but consultation with your oncologist should take place first.   Start hydroxyzine 1 tab a night for sleep and bladder inflammation.  Today we talked about ways to manage bladder urgency such as altering your diet to avoid irritative beverages and foods (bladder diet) as well as attempting to decrease stress and other exacerbating factors.  You can also chew a plain Tums 1-3 times per day to make your urine less acidic, especially if you have eating/drinking acidic things.   There is a website with helpful information for people with bladder irritation, called the IC Network at https://www.ic-network.com. This website has more information about a healthy bladder diet and patient forums for support.  The Most Bothersome Foods* The Least Bothersome Foods*  Coffee - Regular & Decaf Tea - caffeinated Carbonated beverages - cola, non-colas, diet & caffeine-free Alcohols - Beer, Red Wine, White Wine, Champagne Fruits - Grapefruit, [place], Orange, [employer] - Cranberry,  Grapefruit, Orange, Pineapple Vegetables - Tomato & Tomato Products Flavor Enhancers - Hot peppers, Spicy foods, Chili, Horseradish, Vinegar, Monosodium glutamate (MSG) Artificial Sweeteners - NutraSweet, Sweet 'N Low, Equal (sweetener), Saccharin Ethnic foods - [place], [place], [place] food Water  Milk - low-fat & whole Fruits - Bananas, Blueberries, Honeydew melon, Pears, Raisins, Watermelon Vegetables - Broccoli, [address] Sprouts, [place], [place], Cauliflower, [place], Cucumber, Mushrooms, Peas, Radishes, Squash, Zucchini, White potatoes, Sweet potatoes & yams Poultry - Chicken, Eggs, [place], [employer] - Beef, Diplomatic Services operational officer, Lamb Seafood - Shrimp, [place] fish, Salmon Grains - Oat, Rice Snacks - Pretzels, Popcorn  *Theodosia Fishman et al. Diet and its role in interstitial cystitis/bladder pain syndrome (IC/BPS) and comorbid conditions. [employer]. [employer] Int. 2012 Jan 11.     Resume Gemtesa if no relief in 4-6 weeks.  It can take a month to start working so give it time, but if you have bothersome side effects call sooner and we can try a different medication.  Call us  if you have trouble filling the prescription or if it's not covered by your insurance.

## 2024-03-07 NOTE — Progress Notes (Signed)
 [facility]  Date of Visit: 03/07/2024  History of Present Illness: Ms. [name] is a 53 y.o. female scheduled today for a post-operative visit. History of mixed urinary incontinence, history of recurrent UTI, dyspareunia, nocturia, vaginal atrophy, and constipation.   Surgery: s/p Cystourethroscopy, Bladder biopsy, fulguration of bladder lesion on 01/15/24  Bladder lesion noted superior to the right ureteral orifice during cystoscopy on 12/14/23. Benign urothelium and submucosa with chronic inflammation on pathology.  Today she reports doing well until she restarted gardening with bending and lifting mulch/gravel for around 4 weeks.  Denies dysuria, hematuria, increased frequency/urgency.  Bladder spasm after pelvic floor PT, UUI 1-2x/month stopped Gemtesa previously due to improvement of urinary symptoms.  Midurethral sling placed in 08/11/17 with mesh excision 01/09/18 by Dr. Clarke Crouch  - s/p urethral bulking and botox  injections 12/14/23  - initial resolution of SUI and discontinued panty liners. Now SUI around 1-2x/week with sneezing - ran out of vaginal estrogen around end of march  Urodynamics showed: - UDS 10/25/23 showed + CST, + terminal DO, normal pressure flow and PVR with reduced MCC  1. Sensation was increased; capacity was reduced 2. Stress Incontinence was demonstrated at normal pressures; 3. 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  4. Emptying was dysfunctional with a normal PVR, a sustained detrusor contraction present,  abdominal straining not present, dyssynergic urethral sphincter activity on EMG.   Pathology results: A. BLADDER, RIGHT, BIOPSY:  Benign urothelium and submucosa with chronic inflammation    Medications: She has a current medication list which includes the following prescription(s): alprazolam, amphetamine-dextroamphetamine, hydroxyzine, trazodone , and estradiol .    Allergies: Patient is allergic to mirabegron, sulfa antibiotics, solifenacin, elavil [amitriptyline hcl], iodinated contrast media, and lyrica [pregabalin].   Physical Exam: BP 125/81 (BP Location: Left Arm, Patient Position: Sitting, Cuff Size: Normal)   Pulse 81   LMP 10/05/2013   Today's Vitals   03/07/24 1240  BP: 125/81  Pulse: 81   There is no height or weight on file to calculate BMI.  ---------------------------------------------------------  Assessment and Plan:  1. Urinary incontinence, mixed   2. Vaginal atrophy   3. History of recurrent UTI (urinary tract infection)   4. Lesion of bladder     Urinary incontinence, mixed Assessment & Plan: - continue Vivally use for transcutaneous tibial nerve stimulation - s/p urethral bulking and botox  injections 12/14/23 with initial resolution of leakage, symptoms starting to return after increased activity - stopped vaginal estrogen and gemtesa  - UDS prior to midurethral sling in 2018 with bladder capacity, cough induced detrusor overactivity incontinence, pelvic floor dyssynergia and history of dyspareunia with endometriosis s/p pelvic floor PT - UDS 10/25/23 showed + CST, + terminal DO, normal pressure flow and PVR with reduced MCC - resume vaginal estrogen - resume Gemtesa if no relief after 4-6 weeks of vaginal estrogen - avoid anticholinergics due to history of lower extremity edema from Detrol Constant, oxybutynin, Vesicare and chronic constipation - consider bladder pain syndrome due to IBS, migraine and recurrent lower urinary tract symptoms  - Encouraged timed voids and bladder training to an increase daytime urinary frequency - continue pelvic floor PT due to pelvic floor myofascial pain on exam - desires to avoid MUS due to history of mid urethral sling and excision - no change in urinary symptoms after PTNS, however improved bowel symptoms  - pt previously considering SNM if return of OAB symptoms. - For treatment  of stress urinary  incontinence,  non-surgical options include expectant management, weight loss, physical therapy, as well as a pessary.  Surgical options include a midurethral sling, Burch urethropexy, and transurethral injection of a bulking agent. - declines repeat bulking at this time, desires to review symptoms at follow-up  Orders: -     Estradiol ; Place 0.5 g vaginally 2 (two) times a week. Place 0.5g nightly for two weeks then twice a week after  Dispense: 30 g; Refill: 3 -     hydrOXYzine HCl; Take 1 tablet (25 mg total) by mouth at bedtime.  Dispense: 30 tablet; Refill: 1  Vaginal atrophy Assessment & Plan: - For symptomatic vaginal atrophy options include lubrication with a water -based lubricant, personal hygiene measures and barrier protection against wetness, and estrogen replacement in the form of vaginal cream, vaginal tablets, or a time-released vaginal ring.   - resume vaginal estrogen at loading dose x 2 weeks followed by 1g twice a week   History of recurrent UTI (urinary tract infection) Assessment & Plan: - denies UTI symptoms, hematuria, dysuria, prior PVR 30mL - prior POCT with + uro  - For treatment of recurrent urinary tract infections, we discussed management of recurrent UTIs including prophylaxis with a daily low dose antibiotic, transvaginal estrogen therapy, D-mannose, and cranberry supplements.  We discussed the role of diagnostic testing such as cystoscopy and upper tract imaging.   - Rx Pyridium  and start ibuprofen as needed UTI symptoms, encouraged to avoid antibiotic exposure if unclear lower urinary tract symptoms - resume low dose vaginal estrogen  - prior NuSwab negative for infection - Discussed risk of resistant uropathogens with repeated antibiotic exposure   Lesion of bladder Assessment & Plan: - s/p Cystourethroscopy, Bladder biopsy, fulguration of bladder lesion on 01/15/24 - pathology consistent with benign urothelium and submucosa with chronic  inflammation - encouraged resumption of vaginal estrogen - For irritative bladder we reviewed treatment options including altering her diet to avoid irritative beverages and foods as well as attempting to decrease stress and other exacerbating factors.  We also discussed using pyridium  and similar over-the-counter medications for pain relief as needed.  - discussed trial of hydroxyzine at bedtime for inflammation, used in the past per pt    Time spent: I spent 24 minutes dedicated to the care of this patient on the date of this encounter to include pre-visit review of records, face-to-face time with the patient discussing mixed urinary incontinence, vaginal atrophy, history of recurrent UTI, and post visit documentation and ordering medication/ testing.   Return in about 3 months (around 06/07/2024).

## 2024-03-07 NOTE — Assessment & Plan Note (Addendum)
-   For symptomatic vaginal atrophy options include lubrication with a water -based lubricant, personal hygiene measures and barrier protection against wetness, and estrogen replacement in the form of vaginal cream, vaginal tablets, or a time-released vaginal ring.   - resume vaginal estrogen at loading dose x 2 weeks followed by 1g twice a week

## 2024-03-07 NOTE — Assessment & Plan Note (Signed)
-   s/p Cystourethroscopy, Bladder biopsy, fulguration of bladder lesion on 01/15/24 - pathology consistent with benign urothelium and submucosa with chronic inflammation - encouraged resumption of vaginal estrogen - For irritative bladder we reviewed treatment options including altering her diet to avoid irritative beverages and foods as well as attempting to decrease stress and other exacerbating factors.  We also discussed using pyridium  and similar over-the-counter medications for pain relief as needed.  - discussed trial of hydroxyzine at bedtime for inflammation, used in the past per pt

## 2024-03-07 NOTE — Assessment & Plan Note (Signed)
-   continue Vivally use for transcutaneous tibial nerve stimulation - s/p urethral bulking and botox  injections 12/14/23 with initial resolution of leakage, symptoms starting to return after increased activity - stopped vaginal estrogen and gemtesa  - UDS prior to midurethral sling in 2018 with bladder capacity, cough induced detrusor overactivity incontinence, pelvic floor dyssynergia and history of dyspareunia with endometriosis s/p pelvic floor PT - UDS 10/25/23 showed + CST, + terminal DO, normal pressure flow and PVR with reduced MCC - resume vaginal estrogen - resume Gemtesa if no relief after 4-6 weeks of vaginal estrogen - avoid anticholinergics due to history of lower extremity edema from Detrol Constant, oxybutynin, Vesicare and chronic constipation - consider bladder pain syndrome due to IBS, migraine and recurrent lower urinary tract symptoms  - Encouraged timed voids and bladder training to an increase daytime urinary frequency - continue pelvic floor PT due to pelvic floor myofascial pain on exam - desires to avoid MUS due to history of mid urethral sling and excision - no change in urinary symptoms after PTNS, however improved bowel symptoms  - pt previously considering SNM if return of OAB symptoms. - For treatment of stress urinary incontinence,  non-surgical options include expectant management, weight loss, physical therapy, as well as a pessary.  Surgical options include a midurethral sling, Burch urethropexy, and transurethral injection of a bulking agent. - declines repeat bulking at this time, desires to review symptoms at follow-up

## 2024-03-13 DIAGNOSIS — Z779 Other contact with and (suspected) exposures hazardous to health: Secondary | ICD-10-CM | POA: Diagnosis not present

## 2024-03-13 DIAGNOSIS — Z1331 Encounter for screening for depression: Secondary | ICD-10-CM | POA: Diagnosis not present

## 2024-03-13 DIAGNOSIS — Z1231 Encounter for screening mammogram for malignant neoplasm of breast: Secondary | ICD-10-CM | POA: Diagnosis not present

## 2024-03-15 DIAGNOSIS — Z6833 Body mass index (BMI) 33.0-33.9, adult: Secondary | ICD-10-CM | POA: Diagnosis not present

## 2024-03-15 DIAGNOSIS — F419 Anxiety disorder, unspecified: Secondary | ICD-10-CM | POA: Diagnosis not present

## 2024-03-15 DIAGNOSIS — R635 Abnormal weight gain: Secondary | ICD-10-CM | POA: Diagnosis not present

## 2024-03-15 DIAGNOSIS — F909 Attention-deficit hyperactivity disorder, unspecified type: Secondary | ICD-10-CM | POA: Diagnosis not present

## 2024-03-15 DIAGNOSIS — K589 Irritable bowel syndrome without diarrhea: Secondary | ICD-10-CM | POA: Diagnosis not present

## 2024-03-18 ENCOUNTER — Ambulatory Visit: Admitting: Physical Therapy

## 2024-03-25 ENCOUNTER — Encounter: Admitting: Physical Therapy

## 2024-04-03 ENCOUNTER — Institutional Professional Consult (permissible substitution): Admitting: Neurology

## 2024-04-08 ENCOUNTER — Ambulatory Visit: Attending: Obstetrics | Admitting: Physical Therapy

## 2024-04-08 ENCOUNTER — Encounter: Payer: Self-pay | Admitting: Physical Therapy

## 2024-04-08 ENCOUNTER — Encounter: Admitting: Physical Therapy

## 2024-04-08 DIAGNOSIS — R102 Pelvic and perineal pain: Secondary | ICD-10-CM | POA: Diagnosis present

## 2024-04-08 DIAGNOSIS — L905 Scar conditions and fibrosis of skin: Secondary | ICD-10-CM | POA: Insufficient documentation

## 2024-04-08 DIAGNOSIS — M6281 Muscle weakness (generalized): Secondary | ICD-10-CM | POA: Insufficient documentation

## 2024-04-08 DIAGNOSIS — R252 Cramp and spasm: Secondary | ICD-10-CM | POA: Diagnosis present

## 2024-04-08 NOTE — Therapy (Signed)
 OUTPATIENT PHYSICAL THERAPY FEMALE PELVIC TREATMENT   Patient Name: [name] MRN: [id number] DOB:[birth year], 53 y.o., female Today's Date: 04/08/2024  END OF SESSION:  PT End of Session - 04/08/24 0900     Visit Number 9    Date for PT Re-Evaluation 04/12/24    Authorization Type [employer] medicare    Authorization - Number of Visits 8    Progress Note Due on Visit 10    PT Start Time 0900    PT Stop Time 0925    PT Time Calculation (min) 25 min    Activity Tolerance Patient tolerated treatment well    Behavior During Therapy WFL for tasks assessed/performed          Past Medical History:  Diagnosis Date   ADD (attention deficit disorder)    Anxiety    Arthropathy, unspecified, site unspecified    Chronic fatigue syndrome    Depression    Disc degeneration    Fibromyalgia    History of kidney stones    last stone in 2010   Migraines    Multilevel degenerative disc disease    cervical and lumbar, has had injections/epidural   Panic attack 2019   PTSD (post-traumatic stress disorder) 2019   Sleep disorder 01/08/2014   Sleep disturbance, unspecified    Symptomatic mammary hypertrophy 09/2018   Unspecified nonpsychotic mental disorder    Past Surgical History:  Procedure Laterality Date   BILATERAL SALPINGECTOMY Bilateral 10/16/2013   Procedure: BILATERAL SALPINGECTOMY;  Surgeon: Charlie JINNY Flowers, MD;  Location: [facility];  Service: Gynecology;  Laterality: Bilateral;   BREAST REDUCTION SURGERY Bilateral 10/03/2018   Procedure: BILATERAL BREAST REDUCTION;  Surgeon: Lowery Estefana RAMAN, DO;  Location: [facility];  Service: Plastics;  Laterality: Bilateral;   CESAREAN SECTION     x2   CHOLECYSTECTOMY     CYSTOSCOPY N/A 01/15/2024   Procedure: CYSTOSCOPY;  Surgeon: Guadlupe Lianne DASEN, MD;  Location: [facility];  Service: Gynecology;  Laterality: N/A;  bladder biopsy with fulguration   DIAGNOSTIC LAPAROSCOPY     ENDOVENOUS ABLATION SAPHENOUS VEIN W/ LASER  Right 10/26/2023   endovenous laser ablation right greater saphenous vein and stab phlebectomy10-20 incisions right leg by Penne Colorado MD   MANDIBLE SURGERY     REMOVAL OF URINARY SLING N/A 01/09/2018   Procedure: REMOVAL OF MESH URINARY SLING EXTRUSION  CYSTOSTOPY;  Surgeon: Gaston Hamilton, MD;  Location: [facility];  Service: Urology;  Laterality: N/A;   ROBOTIC ASSISTED TOTAL HYSTERECTOMY N/A 10/16/2013   Procedure: ROBOTIC ASSISTED TOTAL HYSTERECTOMY;  Surgeon: Charlie JINNY Flowers, MD;  Location: [facility];  Service: Gynecology;  Laterality: N/A;   ROTATOR CUFF REPAIR Left 08/21/2023   Patient Active Problem List   Diagnosis Date Noted   Lesion of bladder 01/12/2024   Dyspareunia in female 08/18/2023   Constipation 08/18/2023   Nocturia 08/18/2023   Urinary incontinence, mixed 08/18/2023   History of pelvic surgery 08/18/2023   History of recurrent UTI (urinary tract infection) 08/18/2023   Vaginal atrophy 08/18/2023   Vaginal irritation 08/18/2023   Major depressive disorder, recurrent episode (HCC) 06/19/2022   History of COVID-19 01/21/2020   Chronic cough 01/21/2020   Migraine without status migrainosus, not intractable 01/21/2020   Tinnitus of both ears 01/21/2020   Neck pain 08/18/2018   Back pain 08/18/2018   Symptomatic mammary hypertrophy 08/18/2018   Obesity, Class II, BMI 35-39.9 09/29/2017   Depression 03/31/2017   Thyroid  disease 03/31/2017   Sleep disorder 01/08/2014  Fibroids 10/16/2013    PCP: Onita Rush, MD  REFERRING PROVIDER: Guadlupe Lianne DASEN, MD   REFERRING DIAG:  N94.10 (ICD-10-CM) - Dyspareunia in female  K44.00 (ICD-10-CM) - Constipation, unspecified constipation type  R35.1 (ICD-10-CM) - Nocturia  N39.46 (ICD-10-CM) - Urinary incontinence, mixed  Z98.890 (ICD-10-CM) - History of pelvic surgery    THERAPY DIAG:  Muscle weakness (generalized) - Plan: PT plan of care cert/re-cert  Cramp and spasm - Plan: PT plan of care cert/re-cert  Pelvic pain -  Plan: PT plan of care cert/re-cert  Scar - Plan: PT plan of care cert/re-cert  Rationale for Evaluation and Treatment: Rehabilitation  ONSET DATE: 2018  SUBJECTIVE:                                                                                                                                                                                           SUBJECTIVE STATEMENT: I had a bladder spasm and urinated on myself. Still have pain with penile penetration.   Fluid intake: Yes: water  but not enough, low fluid intake   PAIN:  Are you having pain? Yes NPRS scale: 5/10 Pain location: Vaginal, feels like the left side gets irritated  Pain type: sharp Pain description: intermittent   Aggravating factors: penile penetration vaginally Relieving factors: no penile penetration  PRECAUTIONS: None  RED FLAGS: None   WEIGHT BEARING RESTRICTIONS: No  FALLS:  Has patient fallen in last 6 months? 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  Patient has stool leakage.  01/17/24: Patient is straining again with bowel movements. She is having hard balls of stool. No stool leakage.    URINATION: Pain with urination: Yes, tingling pin stab  Fully empty bladder: Yes:   01/17/24: not emptying bladder and has to sit for more urine to come out Stream: Strong Urgency: Yes:  unable to stop the flow of urine when leaking 01/17/24: no urinary urgency or bladder spasms Frequency: night gets up 1 per night; day  urinate 1-2 times Leakage: Urge to void, Walking to the bathroom,  Coughing, Sneezing, Laughing, Lifting, and Bending forward 01/17/24: no urinary leakage Pads: Yes: 3-5 per day 01/17/24: not wearing pads  INTERCOURSE: Pain with intercourse: Initial Penetration and During Penetration, pain on left side Ability to have  vaginal penetration:  Yes:   Climax: not often Marinoff Scale: 2/3  PREGNANCY: C-section deliveries 2  OBJECTIVE:  Note: Objective measures were completed at Evaluation unless otherwise noted.  DIAGNOSTIC FINDINGS:  none   COGNITION: Overall cognitive status: Within functional limits for tasks assessed     SENSATION: Light touch: Appears intact Proprioception: Appears intact    POSTURE: rounded shoulders, forward head, decreased lumbar lordosis, and feet turned outward  PELVIC ALIGNMENT:ASIS are equal   LOWER EXTREMITY MNF:apojuzmjo hip ROM is full   LOWER EXTREMITY MMT: bilateral hip strength is 4-/5   PALPATION:   General  decreased movement of the lower abdominal scar; fascial tightness in the abdomen.                 External Perineal Exam tenderness located in the clitoris, along the ischiocavernosus, and perineal body                             Internal Pelvic Floor tenderness located in the levator ani, obturator internist, sides of the bladder, urethra, along the anterior wall of the vaginal canal  Patient confirms identification and approves PT to assess internal pelvic floor and treatment Yes  PELVIC MMT:   MMT eval 12/26/23 01/29/24 04/08/24  Vaginal 2/5 4/5 4/5 4/5  Internal Anal Sphincter 3/5     External Anal Sphincter 3/5     Puborectalis 3/5     (Blank rows = not tested)        TONE: Increased tone on the left pelvic floor  PROLAPSE: none  TODAY'S TREATMENT:  04/08/24 Manual: Internal pelvic floor techniques: No emotional/communication barriers or cognitive limitation. Patient is motivated to learn. Patient understands and agrees with treatment goals and plan. PT explains patient will be  examined in standing, sitting, and lying down to see how their muscles and joints work. When they are ready, they will be asked to remove their underwear so PT can examine their perineum. The patient is also given the option of providing their own chaperone as one is not provided in our facility. The patient also has the right and is explained the right to defer or refuse any part of the evaluation or treatment including the internal exam. With the patient's consent, PT will use one gloved finger to gently assess the muscles of the pelvic floor, seeing how well it contracts and relaxes and if there is muscle symmetry. After, the patient will get dressed and PT and patient will discuss exam findings and plan of care. PT and patient discuss plan of care, schedule, attendance policy and HEP activities.  Using gloved finger in the vaginal canal working on the introitus, perineal body, pubovaginalis, puborectalis to elongate  Neuromuscular re-education: Pelvic floor contraction training: Therapist gloved finger working on pelvic floor contraction, pelvic floor contraction with cough giving verbal cues and tactile cues to engage the lower abdomen Exercises: Stretches/mobility: Hamstring stretch in supine holding 30 sec bil.  Supine piriformis stretch holding 30 sec bil.   01/29/24 Manual: Internal pelvic floor techniques: No emotional/communication barriers or cognitive limitation. Patient is motivated to learn. Patient understands and agrees with treatment goals and plan. PT explains patient will be examined in standing, sitting, and lying down to see how their muscles and joints work. When they are ready, they will be asked to remove their underwear so PT can examine their perineum. The patient is also given the option of providing their own chaperone as  one is not provided in our facility. The patient also has the right and is explained the right to defer or refuse any part of the evaluation or treatment  including the internal exam. With the patient's consent, PT will use one gloved finger to gently assess the muscles of the pelvic floor, seeing how well it contracts and relaxes and if there is muscle symmetry. After, the patient will get dressed and PT and patient will discuss exam findings and plan of care. PT and patient discuss plan of care, schedule, attendance policy and HEP activities.  Going through the vaginal opening working on the perineal body, along the posterior fourchette, along the ischiocavernosus, along the levator ani to lengthen and reduce pain with penile penetration Neuromuscular re-education: Pelvic floor contraction training: Therapist finger in the vaginal canal working on contraction and full relaxation, not holding her breath, equal anterior and posterior contraction    01/17/24 Manual: Soft tissue mobilization: Circular massage of the abdomen to promote peristalic motion of the intestines to improve bowel movements Manual work along the diaphragm Manual work around the obliques Scar tissue mobilization: Pulling on the scar in different ways manually to increase further stretch of the scar Neuromuscular re-education: Core facilitation: Hip flexion isometric working on core engagement and feels it more on right than the left Supine ball squeeze with alternate shoulder flexion holding 1# with core engaged Laying on side and press top hand into ball to engage the lower abdominals 10 x each side        PATIENT EDUCATION:  11/24/23 Education details: educated patient on vaginal wand to purchase to work on the pelvic floor trigger points and where to purchase it.  Person educated: Patient Education method: Explanation, Demonstration, Tactile cues, Verbal cues, and Handouts Education comprehension: verbalized understanding, returned demonstration, verbal cues required, tactile cues required, and needs further education  HOME EXERCISE PROGRAM: See above.    ASSESSMENT:  CLINICAL IMPRESSION: Patient is a 53 y.o. female who was seen today for physical therapy  treatment for dyspareunia, constipation, urinary incontinence, and history of pelvic surgery. Pain with penile penetration is 25% better. Fecal leakage is 75% better. Patient is back using the [place]. Patient is using the vaginal cream again. She will bulge the pelvic floor when coughing and needed cues to contract the pelvic floor and lower abdomen with a cough. She reports she is leaking with coughing. Patient will be having physical therapy on her hips and this may also help her pelvic floor. She is being discharge due to many things going on in her lift at this time. She may resume physical therapy when her life calms down.   OBJECTIVE IMPAIRMENTS: decreased activity tolerance, decreased coordination, decreased strength, increased fascial restrictions, increased muscle spasms, and pain.   ACTIVITY LIMITATIONS: lifting, bending, sitting, standing, squatting, continence, toileting, and locomotion level  PARTICIPATION LIMITATIONS: meal prep, cleaning, laundry, interpersonal relationship, driving, shopping, and community activity  PERSONAL FACTORS: Fitness and 3+ comorbidities: ADD; Chronic Fatique Syndrome; Fibromyalgia; PTSD; Cholecystectomy; Cesarean sectoin; Total hysterectomy 2014; bladder sling 2017 with mesh; Mesh removed 2018 are also affecting patient's functional outcome.   REHAB POTENTIAL: Good  CLINICAL DECISION MAKING: Evolving/moderate complexity  EVALUATION COMPLEXITY: Moderate   GOALS: Goals reviewed with patient? Yes  SHORT TERM GOALS: Target date: 12/22/23  Patient educated on using a vaginal wand on the pelvic floor muscles to reduce the trigger points.  Baseline: Goal status: Met 12/04/23  2.  Patient able to perform diaphragmatic breathing to elongate the  pelvic floor muscles.  Baseline:  Goal status: Met 12/26/23  3.  Patient educated on initial HEP for  flexibility exercises.  Baseline:  Goal status: Met 12/22/23  4.  Patient educated on scar massage to reduce restrictions of the lower abdomen.  Baseline:  Goal status: Met 12/04/23  LONG TERM GOALS: Target date: 02/16/24  Patient is independent with advanced HEP for core and pelvic floor.  Baseline:  Goal status: Met 04/08/24  2.  Patient is able to have penile penetration vaginally due to the reduction of trigger points of the pelvic floor.  Baseline: getting better  Goal status: partially met 04/08/24  3.  Patient reports her urinary leakage decreased >/= 75% due to increased strength of the pelvic floor >/= 3/5 with circular hug of therapist finger.  Baseline:  Goal status: met 01/17/24  4.  Patient reports her stool leakage has decreased >/= 75% due to reduction of trigger points in the pelvic floor.  Baseline:  Goal status: Met 04/08/24  5.  Patient has improved mobility of the lower abdominal scar so the twisted knot feeling is reduced >/= 50%.  Baseline:  Goal status: Met 12/26/23    PLAN:   PT FREQUENCY: 1 time visit   PT DURATION: 8 weeks   PLANNED INTERVENTIONS: 97110-Therapeutic exercises, 97530- Therapeutic activity, 97112- Neuromuscular re-education, 97535- Self Care, [id number]- Manual therapy, [phone]- Aquatic Therapy, [id number]- Electrical stimulation (unattended), 97035- Ultrasound, Patient/Family education, Dry Needling, Joint mobilization, Spinal mobilization, Scar mobilization, Cryotherapy, Moist heat, and Biofeedback   PLAN FOR NEXT SESSION: 1 time visit for discharge   Channing Pereyra, PT 04/08/24 10:33 AM  PHYSICAL THERAPY DISCHARGE SUMMARY  Visits from Start of Care: 9  Current functional level related to goals / functional outcomes: See above. Patient is ready to be discharged today due to many things going on in her life at this time.    Remaining deficits: See above.    Education / Equipment: HEP   Patient agrees to discharge. Patient goals were partially  met. Patient is being discharged due to the patient's request. Thank you for the referral.   Channing Pereyra, PT 04/08/24 10:33 AM

## 2024-04-09 ENCOUNTER — Encounter: Payer: Self-pay | Admitting: *Deleted

## 2024-04-10 ENCOUNTER — Ambulatory Visit: Admitting: Neurology

## 2024-04-10 ENCOUNTER — Encounter: Payer: Self-pay | Admitting: Neurology

## 2024-04-10 VITALS — BP 118/83 | HR 91 | Ht 62.0 in | Wt 182.0 lb

## 2024-04-10 DIAGNOSIS — G4763 Sleep related bruxism: Secondary | ICD-10-CM

## 2024-04-10 DIAGNOSIS — G4719 Other hypersomnia: Secondary | ICD-10-CM | POA: Diagnosis not present

## 2024-04-10 DIAGNOSIS — Z9189 Other specified personal risk factors, not elsewhere classified: Secondary | ICD-10-CM

## 2024-04-10 DIAGNOSIS — R0681 Apnea, not elsewhere classified: Secondary | ICD-10-CM | POA: Diagnosis not present

## 2024-04-10 DIAGNOSIS — R0683 Snoring: Secondary | ICD-10-CM

## 2024-04-10 DIAGNOSIS — R6889 Other general symptoms and signs: Secondary | ICD-10-CM | POA: Diagnosis not present

## 2024-04-10 DIAGNOSIS — G479 Sleep disorder, unspecified: Secondary | ICD-10-CM

## 2024-04-10 NOTE — Progress Notes (Signed)
 Subjective:    Patient ID: [name] is a 53 y.o. female.  HPI    Tracey Mar, MD, PhD [facility] [address], [address] [address] [place], [place] [address]  Dear Dr. Onita,  I saw your patient, [name], upon your kind request in my sleep clinic today for evaluation of her sleep disorder, concern for sleep apnea.  The patient is unaccompanied today.  As you know, [name] is a 53 year old female with an underlying medical history of overactive bladder, hypertension, kidney stone, fibromyalgia, constipation, depression, allergies, endometriosis, headaches, and obesity, who reports snoring and excessive daytime somnolence, as well as witnessed apneas.  Her Epworth sleepiness score is 14/24, fatigue severity score is 53 out of 63.  I reviewed your office note from 02/23/2024.   She takes trazodone , typically 200 mg at bedtime, your records indicate 100 mg at bedtime but she admits that she has taken up to 300 mg at bedtime and therefore ran out about a week ago.  She has been taking Tylenol  PM, 3 pills nightly.  She reports a bedtime of around 10 or 11 and rise time around 9 AM.  Blood work through your office in May 2025 showed a mildly below normal vitamin D level at 27.4.  She is on oral vitamin D supplementation.  She feels restless.  She does not sleep through the night.  She denies nocturnal morning headaches, no nightly nocturia reported.  She takes Xanax only as needed.  She is going to see her psychiatrist, [name] end of this month or early next month.  She is also scheduled to see a new dentist.  She has a history of bruxism and has used a bite guard.  She drinks caffeine in the form of coffee or tea or soda, 1 or 2 servings per day typically.  She drinks alcohol in the form of beer, about twice a month and is a non-smoker.  She lives with her family including husband and 2 children.  Her weight has fluctuated quite a bit.   I had evaluated  her about 10 years ago for sleep apnea concern, a sleep study at the time was negative for sleep apnea.  She had seen [name] at [facility] neurology in 2018 and 2019 for paresthesias, slurring of speech, memory changes.  Previously:  02/17/2014: 53 year old right-handed woman with an underlying medical history of ADD, depression, anxiety, chronic fatigue syndrome, who I first met on 01/08/2014 at the request of her cardiologist, Dr. Ladona, as she previously had an abnormal pulse oximetry test in February. The patient today presents after she was seen in the emergency room on 02/04/2014 after she reported hitting her for head against a metal box while walking upstairs about 5 days prior. She denied any loss of consciousness. She had a bruise to her forehead and some swelling. This resolved. She had a head CT without contrast in the emergency room on 02/04/2014, which was reported as unremarkable. In addition, I personally reviewed the images through the PACS system. She also had a sleep study which I ordered after I first met her in March.I reviewed her emergency room records and also explained her sleep study findings to her in detail today. We also called her with the test results recently. She had a baseline sleep study on 01/27/2014. Sleep efficiency was markedly reduced at 52.6% with a latency to sleep of 162.5 minutes and wake after sleep onset of 53 minutes with moderate sleep  fragmentation noted. She was noted to have a cup of sweet tea upon arrival, but she states, she reports that she was not drinking it, and that it was left over from her dinner. She took Xanax and trazodone  prior to the sleep study. The arousal index was normal. She had markedly increased percentage of stage II sleep, absence of deep sleep and a markedly reduced percentage of dream sleep with a prolonged REM latency. She had no significant periodic leg movements of sleep and no significant EKG changes. She had mild intermittent  snoring. She slept mostly on the sides. She had a total AHI of 0.8 per hour. Baseline oxygen saturation was 95%, nadir was 91%.   Today, she reports having some shooting pains into the posterior neck and occasional tingling in her hands, none which is new, and has been happening for years. She has occasional HAs with nausea, and photophobia. She has not tried OTC medications consistently, but has on occasion tried Tylenol . She sees an orthopedics for mid and low back pain and is in PT. She sees [name]. She had cardiac workup for chest pains. She still has some intermittent shooting chest pains and also some groin pain. She complains of fatigue. She has not yet received a copy of her sleep study. She is also requesting a copy of her ER visit notes.  Her Past Medical History Is Significant For: Past Medical History:  Diagnosis Date   ADD (attention deficit disorder)    Anxiety    Arthropathy, unspecified, site unspecified    Chronic fatigue syndrome    Depression    Disc degeneration    Fibromyalgia    History of kidney stones    last stone in 2010   Migraines    Multilevel degenerative disc disease    cervical and lumbar, has had injections/epidural   Panic attack 2019   PTSD (post-traumatic stress disorder) 2019   Sleep disorder 01/08/2014   Sleep disturbance, unspecified    Symptomatic mammary hypertrophy 09/2018   Unspecified nonpsychotic mental disorder     Her Past Surgical History Is Significant For: Past Surgical History:  Procedure Laterality Date   BILATERAL SALPINGECTOMY Bilateral 10/16/2013   Procedure: BILATERAL SALPINGECTOMY;  Surgeon: Tracey JINNY Flowers, MD;  Location: [facility];  Service: Gynecology;  Laterality: Bilateral;   BREAST REDUCTION SURGERY Bilateral 10/03/2018   Procedure: BILATERAL BREAST REDUCTION;  Surgeon: Tracey Estefana RAMAN, DO;  Location: [facility];  Service: Plastics;  Laterality: Bilateral;   CESAREAN SECTION     x2   CHOLECYSTECTOMY      CYSTOSCOPY N/A 01/15/2024   Procedure: CYSTOSCOPY;  Surgeon: Tracey Lianne DASEN, MD;  Location: [facility] OR;  Service: Gynecology;  Laterality: N/A;  bladder biopsy with fulguration   DIAGNOSTIC LAPAROSCOPY     ENDOVENOUS ABLATION SAPHENOUS VEIN W/ LASER Right 10/26/2023   endovenous laser ablation right greater saphenous vein and stab phlebectomy10-20 incisions right leg by Penne Colorado MD   MANDIBLE SURGERY     REMOVAL OF URINARY SLING N/A 01/09/2018   Procedure: REMOVAL OF MESH URINARY SLING EXTRUSION  CYSTOSTOPY;  Surgeon: Gaston Hamilton, MD;  Location: [facility];  Service: Urology;  Laterality: N/A;   ROBOTIC ASSISTED TOTAL HYSTERECTOMY N/A 10/16/2013   Procedure: ROBOTIC ASSISTED TOTAL HYSTERECTOMY;  Surgeon: Tracey JINNY Flowers, MD;  Location: [facility];  Service: Gynecology;  Laterality: N/A;   ROTATOR CUFF REPAIR Left 08/21/2023    Her Family History Is Significant For: Family History  Problem Relation Age of  Onset   Heart attack Mother    Hypertension Mother    Depression Mother    Neuropathy Father    Diabetes Father    Stroke Maternal Grandmother    Alcohol abuse Maternal Grandfather    Cancer Paternal Grandfather    Breast cancer Neg Hx    Uterine cancer Neg Hx    Bladder Cancer Neg Hx    Sleep apnea Neg Hx     Her Social History Is Significant For: Social History   Socioeconomic History   Marital status: Married    Spouse name: Not on file   Number of children: Not on file   Years of education: Not on file   Highest education level: Not on file  Occupational History   Not on file  Tobacco Use   Smoking status: Never   Smokeless tobacco: Never  Vaping Use   Vaping status: Former  Substance and Sexual Activity   Alcohol use: Yes    Alcohol/week: 2.0 standard drinks of alcohol    Types: 2 Cans of beer per week    Comment: socially, once per month   Drug use: Yes    Types: Marijuana    Comment: prn when pain gets bad   Sexual activity: Yes    Partners: Male    Birth  control/protection: None, Surgical  Other Topics Concern   Not on file  Social History Narrative   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  She lives at home with husband and two children.   Highest level of education:  Some college      Husband emotionally   Social Drivers of Health   Financial Resource Strain: Low Risk  (08/26/2019)   Overall Financial Resource Strain (CARDIA)    Difficulty of Paying Living Expenses: Not very hard  Food Insecurity: No Food Insecurity (12/07/2020)   Received from [facility]   Hunger Vital Sign    Within the past 12 months, you worried that your food would run out before you got the money to buy more.: Never Tracey    Within the past 12 months, the food you bought just didn't last and you didn't have money to get more.: Never Tracey  Transportation Needs: No Transportation Needs (08/26/2019)   PRAPARE - Administrator, Civil Service (Medical): No    Lack of Transportation (Non-Medical): No  Physical Activity: Inactive (08/26/2019)   Exercise Vital Sign    Days of Exercise per Week: 0 days    Minutes of Exercise per Session: 0 min  Stress: Stress Concern Present (08/26/2019)   [employer] of Occupational Health - Occupational Stress Questionnaire    Feeling of Stress : Very much  Social Connections: Unknown (02/28/2022)   Received from [facility]   Social Network    Social Network: Not on file    Her Allergies Are:  Allergies  Allergen Reactions   Mirabegron Swelling    Lower extremities    Sulfa Antibiotics Hives    All over body   Solifenacin     Other Reaction(s): Unknown   Elavil [Amitriptyline Hcl] Anxiety   Iodinated Contrast Media Rash   Lyrica [Pregabalin] Swelling and Rash    Lower extremities   :   Her Current Medications Are:  Outpatient Encounter Medications as of 04/10/2024  Medication Sig   ALPRAZolam (XANAX) 1 MG tablet Take 1 mg by mouth daily as needed for anxiety.   amphetamine-dextroamphetamine (ADDERALL)  10 MG tablet Take 10 mg by  mouth 2 (two) times daily.   estradiol  (ESTRACE ) 0.1 MG/GM vaginal cream Place 0.5 g vaginally 2 (two) times a week. Place 0.5g nightly for two weeks then twice a week after   hydrOXYzine  (ATARAX ) 25 MG tablet Take 1 tablet (25 mg total) by mouth at bedtime.   traZODone  (DESYREL ) 100 MG tablet Take 300 mg by mouth at bedtime.   No facility-administered encounter medications on file as of 04/10/2024.  :   Review of Systems:  Out of a complete 14 point review of systems, all are reviewed and negative with the exception of these symptoms as listed below:  Review of Systems  Neurological:        Pt here for sleep consult Pt snores,migraines ,fatigue Pt denies hypertension,cpap machine Pt states did gave sleep study done 2015    ESS:14 ESS:53     Objective:  Neurological Exam  Physical Exam Physical Examination:   Vitals:   04/10/24 1349  BP: 118/83  Pulse: 91    General Examination: The patient is a very pleasant 53 y.o. female in no acute distress. She appears well-developed and well-nourished and well groomed.   HEENT: Normocephalic, atraumatic, pupils are equal, round and reactive to light., extraocular tracking is good without limitation to gaze excursion or nystagmus noted. Normal smooth pursuit is noted. Hearing is grossly intact. Face is symmetric with normal facial animation. Speech is clear with no dysarthria noted. There is no hypophonia. There is no lip, neck/head, jaw or voice tremor. Neck is supple with full range of passive and active motion. There are no carotid bruits on auscultation. Oropharynx exam reveals: mild mouth dryness, good dental hygiene and mild airway crowding, due to wider tongue, narrow airway entry and tonsils of about 1-2+, Mallampati is class I. Tongue protrudes centrally and palate elevates symmetrically. Neck size is 13.5 inches.    Chest: Clear to auscultation without wheezing, rhonchi or crackles noted.   Heart:  S1+S2+0, regular and normal without murmurs, rubs or gallops noted.   Abdomen: Soft, non-tender and non-distended.  Extremities: There is no pitting edema in the distal lower extremities bilaterally.   Skin: Warm and dry without trophic changes noted.   Musculoskeletal: exam reveals no obvious joint deformities.   Neurologically:  Mental status: The patient is awake, alert and oriented in all 4 spheres. Her immediate and remote memory, attention, language skills and fund of knowledge are appropriate. There is no evidence of aphasia, agnosia, apraxia or anomia. Speech is clear with normal prosody and enunciation. Thought process is linear. Mood is normal and affect is normal.  Cranial nerves II - XII are as described above under HEENT exam.  Motor exam: Normal bulk, strength and tone is noted. There is no obvious action or resting tremor.  Fine motor skills and coordination: grossly intact.  Cerebellar testing: No dysmetria or intention tremor. There is no truncal or gait ataxia.  Sensory exam: intact to light touch in the upper and lower extremities.  Gait, station and balance: She stands easily. No veering to one side is noted. No leaning to one side is noted. Posture is age-appropriate and stance is narrow based. Gait shows normal stride length and normal pace. No problems turning are noted.   Assessment and Plan:    In summary, [name] is a 53 year old female with an underlying medical history of overactive bladder, hypertension, kidney stone, fibromyalgia, constipation, depression, allergies, endometriosis, headaches, and obesity, whose history and physical exam are concerning for sleep disordered breathing,  particularly obstructive sleep apnea (OSA). A laboratory attended sleep study is typically considered gold standard for evaluation of sleep disordered breathing.   I had a long chat with the patient about my findings and the diagnosis of sleep apnea, particularly  OSA, its prognosis and treatment options. We talked about medical/conservative treatments, surgical interventions and non-pharmacological approaches for symptom control. I explained, in particular, the risks and ramifications of untreated moderate to severe OSA, especially with respect to developing cardiovascular disease down the road, including congestive heart failure (CHF), difficult to treat hypertension, cardiac arrhythmias (particularly A-fib), neurovascular complications including TIA, stroke and dementia. Even type 2 diabetes has, in part, been linked to untreated OSA. Symptoms of untreated OSA may include (but may not be limited to) daytime sleepiness, nocturia (i.e. frequent nighttime urination), memory problems, mood irritability and suboptimally controlled or worsening mood disorder such as depression and/or anxiety, lack of energy, lack of motivation, physical discomfort, as well as recurrent headaches, especially morning or nocturnal headaches. We talked about the importance of maintaining a healthy lifestyle and striving for healthy weight.  She is discouraged from overusing trazodone , she is advised that her daytime somnolence may be from medication effect, she is discouraged from taking too much Benadryl  as it can affect her driving and impair her alertness level. I recommended a sleep study at this time. I outlined the differences between a laboratory attended sleep study which is considered more comprehensive and accurate over the option of a home sleep test (HST); the latter may lead to underestimation of sleep disordered breathing in some instances and does not help with diagnosing upper airway resistance syndrome and is not accurate enough to diagnose primary central sleep apnea typically. I outlined possible surgical and non-surgical treatment options of OSA, including the use of a positive airway pressure (PAP) device (i.e. CPAP, AutoPAP/APAP or BiPAP in certain circumstances), a  custom-made dental device (aka oral appliance, which would require a referral to a specialist dentist or orthodontist typically, and is generally speaking not considered for patients with full dentures or edentulous state), upper airway surgical options, such as traditional UPPP (which is not considered a first-line treatment) or the Inspire device (hypoglossal nerve stimulator, which would involve a referral for consultation with an ENT surgeon, after careful selection, following inclusion criteria - also not first-line treatment). I explained the PAP treatment option to the patient in detail, as this is generally considered first-line treatment.  The patient indicated that she would be willing to try PAP therapy, if the need arises.   We will pick up our discussion about the next steps and treatment options after testing.  We will keep her posted as to the test results by phone call and/or MyChart messaging where possible.  We will plan to follow-up in sleep clinic accordingly as well.  I answered all her questions today and the patient was in agreement.   I encouraged her to call with any interim questions, concerns, problems or updates or email us  through MyChart.  Generally speaking, sleep test authorizations may take up to 2 weeks, sometimes less, sometimes longer, the patient is encouraged to get in touch with us  if they do not hear back from the sleep lab staff directly within the next 2 weeks.  Thank you very much for allowing me to participate in the care of this nice patient. If I can be of any further assistance to you please do not hesitate to call me at [phone].  Sincerely,   Tracey Mar, MD,  PhD

## 2024-04-10 NOTE — Patient Instructions (Signed)

## 2024-04-17 ENCOUNTER — Telehealth: Payer: Self-pay | Admitting: Neurology

## 2024-04-17 NOTE — Telephone Encounter (Signed)
 LVM for pt to call back to schedule   [employer] medicare no auth req via website

## 2024-04-18 NOTE — Telephone Encounter (Signed)
 Patient returned my call.  [place] [employer] [place] no auth req   She is scheduled at [facility] For 05/20/24 at 9 pm.  Mailed packet and sent mychart.

## 2024-04-29 ENCOUNTER — Encounter: Admitting: Physical Therapy

## 2024-04-30 ENCOUNTER — Encounter: Payer: Self-pay | Admitting: Obstetrics

## 2024-05-03 ENCOUNTER — Other Ambulatory Visit: Payer: Self-pay | Admitting: Obstetrics

## 2024-05-03 DIAGNOSIS — N3946 Mixed incontinence: Secondary | ICD-10-CM

## 2024-05-06 ENCOUNTER — Encounter: Admitting: Physical Therapy

## 2024-05-20 ENCOUNTER — Ambulatory Visit (INDEPENDENT_AMBULATORY_CARE_PROVIDER_SITE_OTHER): Admitting: Neurology

## 2024-05-20 DIAGNOSIS — G4719 Other hypersomnia: Secondary | ICD-10-CM

## 2024-05-20 DIAGNOSIS — G4720 Circadian rhythm sleep disorder, unspecified type: Secondary | ICD-10-CM

## 2024-05-20 DIAGNOSIS — G4733 Obstructive sleep apnea (adult) (pediatric): Secondary | ICD-10-CM

## 2024-05-20 DIAGNOSIS — R0683 Snoring: Secondary | ICD-10-CM | POA: Diagnosis not present

## 2024-05-20 DIAGNOSIS — R6889 Other general symptoms and signs: Secondary | ICD-10-CM

## 2024-05-20 DIAGNOSIS — Z9189 Other specified personal risk factors, not elsewhere classified: Secondary | ICD-10-CM

## 2024-05-20 DIAGNOSIS — R0681 Apnea, not elsewhere classified: Secondary | ICD-10-CM

## 2024-05-20 DIAGNOSIS — G479 Sleep disorder, unspecified: Secondary | ICD-10-CM

## 2024-05-21 NOTE — Procedures (Signed)
 Physician Interpretation: see under procedure and/or encounter tab.

## 2024-05-27 ENCOUNTER — Ambulatory Visit: Payer: Self-pay | Admitting: Neurology

## 2024-05-27 DIAGNOSIS — G4733 Obstructive sleep apnea (adult) (pediatric): Secondary | ICD-10-CM

## 2024-05-29 ENCOUNTER — Telehealth: Payer: Self-pay | Admitting: Neurology

## 2024-05-29 DIAGNOSIS — G4733 Obstructive sleep apnea (adult) (pediatric): Secondary | ICD-10-CM

## 2024-05-29 NOTE — Telephone Encounter (Signed)
 Referral placed to [facility].

## 2024-05-29 NOTE — Telephone Encounter (Signed)
 Referral for Dentistry  faxed to [facility]  Phone: [phone] Fax: [phone]

## 2024-05-29 NOTE — Telephone Encounter (Signed)
 I called pt.  I gave her the results of the sleep study.  Mild OSA recommend treatment in form of autopap.  Pt said that she would like to do a oral dental device as she clenches her teeth.  I did relay this was Dr. Obie recommendation initial treatment but am glad to ask and get back with her.  I did receive call from her dentist for referral as well.  I did go ahead and relay the result message to her as well, if  PAP therapy to be initiated.  She verbalized understanding and appreciated checking.

## 2024-05-29 NOTE — Telephone Encounter (Signed)
 Santana from [facility] called to follow up about Pt  sleep study . Santana wanted to know was Pt put on any medicatin and If Neurologist have and recommendation for PT ?

## 2024-05-29 NOTE — Telephone Encounter (Signed)
 Faxed to [facility] [phone], received fax confirmation.

## 2024-05-29 NOTE — Telephone Encounter (Signed)
 Santana call back   [phone]

## 2024-05-30 NOTE — Telephone Encounter (Signed)
 I called and LMVM for [facility], that we send referral for oral device for OSA, dentist does the order for what he recommends.  Please call back if needed.

## 2024-06-07 ENCOUNTER — Ambulatory Visit: Admitting: Obstetrics

## 2024-06-26 NOTE — Telephone Encounter (Signed)
 Pt called to request  to speak to MD , Pt stated that    Dentist that she was referred to  didn't have mouth card that insurance will pay for the patient is requesting to speak to Md about different treatments if possible

## 2024-06-27 NOTE — Telephone Encounter (Signed)
 Called pt and discussed. Since pt prefers oral appliance over autopap, will refer to Dr Micky to see if insurance will cover appliance. If it doesn't, patient will let us  know and will agree to autopap.   Referral sent to work-in provider to sign on Dr Obie behalf.

## 2024-07-15 NOTE — Telephone Encounter (Signed)
 Patient called in today to advise that [name] does not take her insurance. She inquired again about [name]. I reached out to [name] and they advised that they had not received the referral but they do take her insurance. I had another referral form filled out and signed by Dr Buck and the form plus all relevant information was faxed to [facility]. They are aware of referral being sent and will reach out to patient today. I advised patient of same and she was appreciative of the referral.

## 2024-07-15 NOTE — Telephone Encounter (Signed)
 Referral for dentistry has been refax to [facility]. Phone: [phone], Fax: [phone]

## 2024-07-15 NOTE — Telephone Encounter (Signed)
 Patient asking for referral for dentistry to be resent to Dr. Melba due to have heard from Dr. Carmin office. Patient have left voicemail with no response.

## 2024-10-28 ENCOUNTER — Encounter: Payer: Self-pay | Admitting: *Deleted
